# Patient Record
Sex: Female | Born: 1950 | Race: Black or African American | Hispanic: No | Marital: Single | Smoking: Never smoker
Health system: Southern US, Community
[De-identification: ages and names within clinical notes are randomized; demographics above are authoritative.]

## PROBLEM LIST (undated history)

## (undated) DIAGNOSIS — E785 Hyperlipidemia, unspecified: Secondary | ICD-10-CM

## (undated) DIAGNOSIS — I10 Essential (primary) hypertension: Secondary | ICD-10-CM

## (undated) HISTORY — DX: Hyperlipidemia, unspecified: E78.5

---

## 2002-05-03 ENCOUNTER — Ambulatory Visit (HOSPITAL_COMMUNITY): Admission: RE | Admit: 2002-05-03 | Discharge: 2002-05-03 | Payer: Self-pay | Admitting: Internal Medicine

## 2003-06-01 ENCOUNTER — Emergency Department (HOSPITAL_COMMUNITY): Admission: AD | Admit: 2003-06-01 | Discharge: 2003-06-01 | Payer: Self-pay | Admitting: Family Medicine

## 2003-07-03 ENCOUNTER — Encounter: Admission: RE | Admit: 2003-07-03 | Discharge: 2003-07-03 | Payer: Self-pay | Admitting: Family Medicine

## 2003-07-20 ENCOUNTER — Encounter: Admission: RE | Admit: 2003-07-20 | Discharge: 2003-07-20 | Payer: Self-pay | Admitting: Family Medicine

## 2003-10-27 ENCOUNTER — Encounter: Admission: RE | Admit: 2003-10-27 | Discharge: 2003-10-27 | Payer: Self-pay | Admitting: Family Medicine

## 2003-11-15 ENCOUNTER — Encounter (INDEPENDENT_AMBULATORY_CARE_PROVIDER_SITE_OTHER): Payer: Self-pay | Admitting: *Deleted

## 2003-11-15 ENCOUNTER — Encounter: Admission: RE | Admit: 2003-11-15 | Discharge: 2003-11-15 | Payer: Self-pay | Admitting: Sports Medicine

## 2003-11-15 LAB — CONVERTED CEMR LAB

## 2003-11-23 ENCOUNTER — Encounter: Admission: RE | Admit: 2003-11-23 | Discharge: 2003-11-23 | Payer: Self-pay | Admitting: Family Medicine

## 2003-12-08 ENCOUNTER — Encounter: Admission: RE | Admit: 2003-12-08 | Discharge: 2003-12-08 | Payer: Self-pay | Admitting: Sports Medicine

## 2004-03-08 ENCOUNTER — Ambulatory Visit: Payer: Self-pay | Admitting: Family Medicine

## 2004-03-08 ENCOUNTER — Ambulatory Visit (HOSPITAL_COMMUNITY): Admission: RE | Admit: 2004-03-08 | Discharge: 2004-03-08 | Payer: Self-pay | Admitting: Family Medicine

## 2004-03-11 ENCOUNTER — Ambulatory Visit: Payer: Self-pay | Admitting: Family Medicine

## 2004-06-06 ENCOUNTER — Ambulatory Visit: Payer: Self-pay | Admitting: Sports Medicine

## 2004-10-18 ENCOUNTER — Ambulatory Visit: Payer: Self-pay | Admitting: Family Medicine

## 2005-02-14 ENCOUNTER — Ambulatory Visit: Payer: Self-pay | Admitting: Sports Medicine

## 2005-02-24 ENCOUNTER — Encounter: Admission: RE | Admit: 2005-02-24 | Discharge: 2005-02-24 | Payer: Self-pay | Admitting: Sports Medicine

## 2005-03-13 ENCOUNTER — Ambulatory Visit: Payer: Self-pay | Admitting: Family Medicine

## 2005-03-20 ENCOUNTER — Encounter: Admission: RE | Admit: 2005-03-20 | Discharge: 2005-03-20 | Payer: Self-pay | Admitting: Sports Medicine

## 2005-04-10 ENCOUNTER — Ambulatory Visit: Payer: Self-pay | Admitting: Family Medicine

## 2005-07-09 ENCOUNTER — Ambulatory Visit: Payer: Self-pay | Admitting: Family Medicine

## 2005-08-12 ENCOUNTER — Ambulatory Visit: Payer: Self-pay | Admitting: Sports Medicine

## 2005-12-19 ENCOUNTER — Ambulatory Visit: Payer: Self-pay | Admitting: Family Medicine

## 2006-02-27 ENCOUNTER — Ambulatory Visit: Payer: Self-pay | Admitting: Family Medicine

## 2006-03-23 ENCOUNTER — Encounter: Admission: RE | Admit: 2006-03-23 | Discharge: 2006-03-23 | Payer: Self-pay | Admitting: Family Medicine

## 2006-08-14 ENCOUNTER — Encounter (INDEPENDENT_AMBULATORY_CARE_PROVIDER_SITE_OTHER): Payer: Self-pay | Admitting: *Deleted

## 2006-08-21 ENCOUNTER — Telehealth: Payer: Self-pay | Admitting: Family Medicine

## 2006-09-01 ENCOUNTER — Ambulatory Visit: Payer: Self-pay | Admitting: Family Medicine

## 2006-09-01 DIAGNOSIS — I10 Essential (primary) hypertension: Secondary | ICD-10-CM

## 2006-10-01 ENCOUNTER — Telehealth: Payer: Self-pay | Admitting: *Deleted

## 2006-10-30 ENCOUNTER — Telehealth: Payer: Self-pay | Admitting: *Deleted

## 2007-01-07 ENCOUNTER — Ambulatory Visit: Payer: Self-pay | Admitting: Family Medicine

## 2007-01-07 DIAGNOSIS — E669 Obesity, unspecified: Secondary | ICD-10-CM

## 2007-03-12 ENCOUNTER — Encounter (INDEPENDENT_AMBULATORY_CARE_PROVIDER_SITE_OTHER): Payer: Self-pay | Admitting: Family Medicine

## 2007-03-12 ENCOUNTER — Ambulatory Visit: Payer: Self-pay | Admitting: Family Medicine

## 2007-03-12 LAB — CONVERTED CEMR LAB
CO2: 26 meq/L (ref 19–32)
Chloride: 104 meq/L (ref 96–112)
Cholesterol: 202 mg/dL — ABNORMAL HIGH (ref 0–200)
Potassium: 4.1 meq/L (ref 3.5–5.3)
Sodium: 142 meq/L (ref 135–145)
Total CHOL/HDL Ratio: 3.7
VLDL: 16 mg/dL (ref 0–40)

## 2007-03-16 ENCOUNTER — Encounter (INDEPENDENT_AMBULATORY_CARE_PROVIDER_SITE_OTHER): Payer: Self-pay | Admitting: Family Medicine

## 2007-04-01 ENCOUNTER — Encounter (INDEPENDENT_AMBULATORY_CARE_PROVIDER_SITE_OTHER): Payer: Self-pay | Admitting: Family Medicine

## 2007-04-16 ENCOUNTER — Encounter: Admission: RE | Admit: 2007-04-16 | Discharge: 2007-04-16 | Payer: Self-pay | Admitting: Sports Medicine

## 2007-07-13 ENCOUNTER — Telehealth: Payer: Self-pay | Admitting: Family Medicine

## 2007-08-17 ENCOUNTER — Ambulatory Visit: Payer: Self-pay | Admitting: Family Medicine

## 2007-08-17 DIAGNOSIS — M545 Low back pain: Secondary | ICD-10-CM

## 2007-08-31 ENCOUNTER — Ambulatory Visit: Payer: Self-pay | Admitting: *Deleted

## 2008-03-10 ENCOUNTER — Ambulatory Visit: Payer: Self-pay | Admitting: Family Medicine

## 2008-03-16 ENCOUNTER — Ambulatory Visit: Payer: Self-pay | Admitting: Gastroenterology

## 2008-03-16 ENCOUNTER — Telehealth: Payer: Self-pay | Admitting: Gastroenterology

## 2008-03-27 ENCOUNTER — Ambulatory Visit: Payer: Self-pay | Admitting: Gastroenterology

## 2008-03-27 ENCOUNTER — Encounter: Payer: Self-pay | Admitting: Gastroenterology

## 2008-03-27 HISTORY — PX: COLONOSCOPY: SHX174

## 2008-03-28 ENCOUNTER — Encounter: Payer: Self-pay | Admitting: Gastroenterology

## 2008-03-30 ENCOUNTER — Encounter: Payer: Self-pay | Admitting: Family Medicine

## 2008-04-17 ENCOUNTER — Encounter: Admission: RE | Admit: 2008-04-17 | Discharge: 2008-04-17 | Payer: Self-pay | Admitting: Family Medicine

## 2008-04-20 ENCOUNTER — Telehealth (INDEPENDENT_AMBULATORY_CARE_PROVIDER_SITE_OTHER): Payer: Self-pay | Admitting: *Deleted

## 2008-04-21 ENCOUNTER — Ambulatory Visit: Payer: Self-pay | Admitting: Family Medicine

## 2008-04-21 DIAGNOSIS — R05 Cough: Secondary | ICD-10-CM

## 2008-04-28 ENCOUNTER — Ambulatory Visit: Payer: Self-pay | Admitting: Family Medicine

## 2008-04-28 DIAGNOSIS — J309 Allergic rhinitis, unspecified: Secondary | ICD-10-CM | POA: Insufficient documentation

## 2008-05-23 ENCOUNTER — Telehealth: Payer: Self-pay | Admitting: *Deleted

## 2008-05-23 ENCOUNTER — Ambulatory Visit: Payer: Self-pay | Admitting: Family Medicine

## 2008-05-23 DIAGNOSIS — J029 Acute pharyngitis, unspecified: Secondary | ICD-10-CM

## 2008-08-08 ENCOUNTER — Encounter: Payer: Self-pay | Admitting: Family Medicine

## 2008-08-22 ENCOUNTER — Encounter: Payer: Self-pay | Admitting: Family Medicine

## 2009-02-28 ENCOUNTER — Telehealth: Payer: Self-pay | Admitting: Family Medicine

## 2009-03-07 ENCOUNTER — Encounter: Payer: Self-pay | Admitting: Family Medicine

## 2009-03-07 ENCOUNTER — Ambulatory Visit: Payer: Self-pay | Admitting: Family Medicine

## 2009-03-07 DIAGNOSIS — E785 Hyperlipidemia, unspecified: Secondary | ICD-10-CM

## 2009-03-07 LAB — CONVERTED CEMR LAB
Albumin: 4.6 g/dL (ref 3.5–5.2)
BUN: 10 mg/dL (ref 6–23)
Chloride: 103 meq/L (ref 96–112)
Creatinine, Ser: 0.9 mg/dL (ref 0.40–1.20)
Glucose, Bld: 136 mg/dL — ABNORMAL HIGH (ref 70–99)
HDL: 56 mg/dL (ref >39)
LDL Cholesterol: 130 mg/dL — ABNORMAL HIGH (ref 0–99)
Potassium: 4.1 meq/L (ref 3.5–5.3)
Total Protein: 7.4 g/dL (ref 6.0–8.3)

## 2009-08-13 ENCOUNTER — Encounter: Payer: Self-pay | Admitting: Family Medicine

## 2009-08-13 ENCOUNTER — Ambulatory Visit: Payer: Self-pay | Admitting: Family Medicine

## 2009-08-14 ENCOUNTER — Ambulatory Visit (HOSPITAL_COMMUNITY): Admission: RE | Admit: 2009-08-14 | Discharge: 2009-08-14 | Payer: Self-pay | Admitting: Family Medicine

## 2009-08-15 ENCOUNTER — Telehealth (INDEPENDENT_AMBULATORY_CARE_PROVIDER_SITE_OTHER): Payer: Self-pay | Admitting: *Deleted

## 2009-08-15 ENCOUNTER — Telehealth: Payer: Self-pay | Admitting: *Deleted

## 2009-08-17 LAB — CONVERTED CEMR LAB
ALT: 20 units/L (ref 0–35)
CO2: 27 meq/L (ref 19–32)
Calcium: 9.6 mg/dL (ref 8.4–10.5)
Creatinine, Ser: 0.82 mg/dL (ref 0.40–1.20)
Direct LDL: 172 mg/dL — ABNORMAL HIGH
Glucose, Bld: 122 mg/dL — ABNORMAL HIGH (ref 70–99)
Potassium: 4.1 meq/L (ref 3.5–5.3)
Sodium: 141 meq/L (ref 135–145)

## 2009-08-23 ENCOUNTER — Telehealth: Payer: Self-pay | Admitting: *Deleted

## 2009-10-19 ENCOUNTER — Telehealth: Payer: Self-pay | Admitting: Family Medicine

## 2010-02-26 ENCOUNTER — Ambulatory Visit: Payer: Self-pay | Admitting: Family Medicine

## 2010-02-26 DIAGNOSIS — E119 Type 2 diabetes mellitus without complications: Secondary | ICD-10-CM | POA: Insufficient documentation

## 2010-02-27 ENCOUNTER — Encounter: Payer: Self-pay | Admitting: Family Medicine

## 2010-02-27 ENCOUNTER — Ambulatory Visit: Payer: Self-pay | Admitting: Family Medicine

## 2010-02-27 LAB — CONVERTED CEMR LAB: Hgb A1c MFr Bld: 6.8 %

## 2010-02-28 LAB — CONVERTED CEMR LAB
ALT: 17 units/L (ref 0–35)
AST: 18 units/L (ref 0–37)
Albumin: 4.6 g/dL (ref 3.5–5.2)
Alkaline Phosphatase: 84 units/L (ref 39–117)
CO2: 28 meq/L (ref 19–32)
Chloride: 103 meq/L (ref 96–112)
Glucose, Bld: 117 mg/dL — ABNORMAL HIGH (ref 70–99)
Potassium: 4.1 meq/L (ref 3.5–5.3)
Total Protein: 7.4 g/dL (ref 6.0–8.3)

## 2010-03-06 ENCOUNTER — Telehealth: Payer: Self-pay | Admitting: Family Medicine

## 2010-06-11 ENCOUNTER — Ambulatory Visit: Payer: Self-pay | Admitting: Family Medicine

## 2010-06-11 LAB — CONVERTED CEMR LAB: Hgb A1c MFr Bld: 6.4 %

## 2010-07-16 NOTE — Assessment & Plan Note (Signed)
Summary: cpe,tcb   Vital Signs:  Patient profile:   60 year old female Height:      62 inches Weight:      174.4 pounds BMI:     32.01 Temp:     98.1 degrees F oral Pulse rate:   76 / minute BP sitting:   130 / 78  (right arm) Cuff size:   regular  Vitals Entered By: Garen Grams LPN (August 13, 2009 10:20 AM) CC: CPE Is Patient Diabetic? No Pain Assessment Patient in pain? no        Primary Care Provider:  Bobby Rumpf  MD  CC:  CPE.  History of Present Illness: 1) HTN: On Toprol XL 25 mg, Avalide 300/125 mg. BP today 130/78. Denies chest pain, dyspnea, LE edema. Checks BPs at home - highest systolic is 150's - depends on foods she eats. Has been monitoring sodium intake. Has started to increase fruit and vegetable intake. No exercise currently but has treadmill at home. [facility] no [facility] able to provide Avalide - she does not know for sure which medications they have but they sent her a notification regarding this.   2) Obesity: 174 lbs today (states that weight was 169 at homne this AM). 183 lbs in December 2009. Still not currently exercising - wants to join group of friends to start walking or buy a new treadmill. Has been increasing intake of fruits and vegetables, whole grains; has cut out sodas and fried foods.. Wants to lose weight but not always motivated to exercise.   3) Hyperlipidemia: Lipid panel last checked in September 2010. Total cholesterol 206, trig 102, HDL 56, LDL 130. Exercise history and diet history as above. Denies chest pain, claudication, stroke hx.   Habits & Providers  Alcohol-Tobacco-Diet     Tobacco Status: never  Current Medications (verified): 1)  Metoprolol Succinate 25 Mg Tb24 (Metoprolol Succinate) .... Take One By Mouth Once Daily 2)  Avalide 300-12.5 Mg Tabs (Irbesartan-Hydrochlorothiazide) .Marland Kitchen.. 1 Tab By Mouth Daily. 3)  Flonase 50 Mcg/act Susp (Fluticasone Propionate) .Marland Kitchen.. 1 Inh Per Nostril Daily.  Allergies (verified): 1)   Penicillin G Potassium (Penicillin G Potassium)  Past History:  Past Medical History: Last updated: 01/07/2007 HTN Hypercholesterolemia Obesity  Past Surgical History: Last updated: 08/13/2006 Tubal ligation - 07/06/2003  Social History: Last updated: 08/13/2009 Separated. Has 3 adult sons in [place]. Works at [employer]  No tobacco, EtOH, or drugs.  48 year old grand daughter lives in [place], [place] and stays with pt frequently.  Family History: Reviewed history from 08/13/2006 and no changes required. HTN  Social History: Separated. Has 3 adult sons in [place]. Works at [employer]  No tobacco, EtOH, or drugs.  62 year old grand daughter lives in [place], [place] and stays with pt frequently.  Physical Exam  General:  obese pleasant female, NAD  vitals reviewed  Eyes:  No corneal or conjunctival inflammation noted. EOMI. Perrla. Funduscopic exam benign, without hemorrhages, exudates or papilledema. Vision grossly normal. Nose:  External nasal examination shows no deformity or inflammation. Nasal mucosa are pink and moist without lesions or exudates. Mouth:  Oral mucosa and oropharynx without lesions or exudates.  Teeth in good repair. Neck:  no JVD or carotid bruit or masses  Lungs:  CTAB w/o wheeze or crackles , normal WOB   Heart:  RRR< no murmurs, normal PMI  Abdomen:  obese , soft, non tender, +BS  Msk:  5/5 strength all extremities, no joint pain or swelling  Pulses:  2+ radials and pedal  Extremities:  no edema  Neurologic:  alert & oriented X3, cranial nerves II-XII intact, strength normal in all extremities, sensation intact to light touch, gait normal, and DTRs symmetrical and normal.     Impression & Recommendations:  Problem # 1:  HYPERLIPIDEMIA (ICD-272.4) Assessment Unchanged Recheck  FASTING LDL, CMET. Advised to increase activity, continue dietary modifications. Follow up 6 months  Orders: Direct LDL-[facility] [phone]) Comp Met-[facility] ([id number])  Problem #  2:  HYPERTENSION (ICD-401.9) Assessment: Unchanged  Will check FASTING CMET,LDL. Patient at goal of less than 140/90. Will have patient bring paperwork from [facility] this afternoon to see what changes need to be made to medications since they no longer carry Avalide. Reviewed DASH diet, exercise for 20 minutes with this patient. Sodium restriction < 2000 mg. Follow up 6 months.   Her updated medication list for this problem includes:    Metoprolol Succinate 25 Mg Tb24 (Metoprolol succinate) .Marland Kitchen... Take one by mouth once daily    Avalide 300-12.5 Mg Tabs (Irbesartan-hydrochlorothiazide) .Marland Kitchen... 1 tab by mouth daily.  Orders: Comp Met-[facility] [id number]) Lipid-[facility] ([id number])  Problem # 3:  PHYSICAL EXAMINATION (ICD-V70.0) Assessment: Comment Only Mammogram referral given. Flu vaccine given. UTD on colonoscopy Pap in 6 months (now at q 3 years)  Problem # 4:  OBESITY (ICD-278.00)  Improving. Discussed DASH diet, exercise for 20 minutes with this patient. Follow in 6 months.   Ht: 62 (08/13/2009)   Wt: 174.4 (08/13/2009)   BMI: 32.01 (08/13/2009)  Complete Medication List: 1)  Metoprolol Succinate 25 Mg Tb24 (Metoprolol succinate) .... Take one by mouth once daily 2)  Avalide 300-12.5 Mg Tabs (Irbesartan-hydrochlorothiazide) .Marland Kitchen.. 1 tab by mouth daily. 3)  Flonase 50 Mcg/act Susp (Fluticasone propionate) .Marland Kitchen.. 1 inh per nostril daily.  Patient Instructions: 1)  It was great to see you today!  2)  Bring the paper with the available medications for your Avalide by today so I can refill it for you.  3)  Great job on losing weight; keep working toward your weight loss goal! 4)  Start walking with firends, at home on the treadmill or at the gym 45 minutes a day 5-7 times per week (start slow at first then work up to this) 5)  Get your mammogram scheduled. 6)  Follow up in 6 months. We will talk about your weight, blood pressure and will do your Pap smear then.    Prevention & Chronic  Care Immunizations   Influenza vaccine: Fluvax 3+  (03/10/2008)   Influenza vaccine due: 03/10/2009    Tetanus booster: 11/15/2003: Done.   Tetanus booster due: 11/14/2013    Pneumococcal vaccine: Not documented  Colorectal Screening   Hemoccult: Done.  (11/15/2003)   Hemoccult due: Not Indicated    Colonoscopy: Location:  [facility].    (03/27/2008)   Colonoscopy due: 03/27/2018  Other Screening   Pap smear: Done.  (11/15/2003)   Pap smear due: 03/07/2010    Mammogram: normal  (04/17/2008)   Mammogram action/deferral: Ordered  (08/13/2009)   Mammogram due: 04/17/2009   Smoking status: never  (08/13/2009)  Lipids   Total Cholesterol: 206  (03/07/2009)   Lipid panel action/deferral: LDL Direct ordered   LDL: 884  (03/07/2009)   LDL Direct: Not documented   HDL: 56  (03/07/2009)   Triglycerides: 102  (03/07/2009)   Lipid panel due: 02/10/2010    SGOT (AST): 20  (03/07/2009)   BMP action: Ordered   SGPT (ALT): 23  (  03/07/2009) CMP ordered    Alkaline phosphatase: 96  (03/07/2009)   Total bilirubin: 0.3  (03/07/2009)   Liver panel due: 02/10/2010    Lipid flowsheet reviewed?: Yes   Progress toward LDL goal: At goal  Hypertension   Last Blood Pressure: 130 / 78  (08/13/2009)   Serum creatinine: 0.90  (03/07/2009)   BMP action: Ordered   Serum potassium 4.1  (03/07/2009) CMP ordered    Basic metabolic panel due: 03/07/2010    Hypertension flowsheet reviewed?: Yes   Progress toward BP goal: At goal  Self-Management Support :   Personal Goals (by the next clinic visit) :      Personal blood pressure goal: 140/90  (08/13/2009)     Personal LDL goal: 130  (08/13/2009)    Patient will work on the following items until the next clinic visit to reach self-care goals:     Medications and monitoring: take my medicines every day, check my blood pressure, bring all of my medications to every visit, weigh myself weekly  (08/13/2009)     Eating: drink  diet soda or water instead of juice or soda, eat more vegetables, use fresh or frozen vegetables, eat foods that are low in salt, eat baked foods instead of fried foods, eat fruit for snacks and desserts, limit or avoid alcohol  (08/13/2009)     Activity: take a 30 minute walk every day, join a walking program  (03/07/2009)    Hypertension self-management support: BP self-monitoring log, Written self-care plan  (03/07/2009)    Hypertension self-management support not done because: Good outcomes  (08/13/2009)    Lipid self-management support: Written self-care plan  (03/07/2009)     Lipid self-management support not done because: Good outcomes  (08/13/2009)  Appended Document: Orders Update    Clinical Lists Changes  Orders: Added new Test order of [facility] - Est  40-64 yrs ([address]11) - Signed      Appended Document: Avalide to Avapro + HCTZ  Change from Avalide to Avapro and HCTZ as below. Bobby Rumpf  MD  August 15, 2009 12:53 PM    Clinical Lists Changes  Medications: Added new medication of AVAPRO 300 MG TABS (IRBESARTAN) one tab by mouth qday - Signed Added new medication of HYDROCHLOROTHIAZIDE 12.5 MG CAPS (HYDROCHLOROTHIAZIDE) one tab by mouth qday - Signed Removed medication of AVALIDE 300-12.5 MG TABS (IRBESARTAN-HYDROCHLOROTHIAZIDE) 1 tab by mouth daily. Rx of AVAPRO 300 MG TABS (IRBESARTAN) one tab by mouth qday;  #30 x 3;  Signed;  Entered by: Bobby Rumpf  MD;  Authorized by: Bobby Rumpf  MD;  Method used: Faxed to [facility], [address] [place], [place], [place]  [address], Ph: [phone], Fax: [phone] Rx of HYDROCHLOROTHIAZIDE 12.5 MG CAPS (HYDROCHLOROTHIAZIDE) one tab by mouth qday;  #30 x 3;  Signed;  Entered by: Bobby Rumpf  MD;  Authorized by: Bobby Rumpf  MD;  Method used: Faxed to [facility], [address] [place], [place], [place]  [address], Ph: [phone], Fax: [phone]    Prescriptions: HYDROCHLOROTHIAZIDE 12.5 MG CAPS  (HYDROCHLOROTHIAZIDE) one tab by mouth qday  #30 x 3   Entered and Authorized by:   Bobby Rumpf  MD   Signed by:   Bobby Rumpf  MD on 08/15/2009   Method used:   Faxed to ...       [facility] Department (retail)       [address] [place], [place]  [address]       Ph: [phone]       Fax: [phone]   RxID:   [id number] AVAPRO 300 MG TABS (IRBESARTAN) one tab by mouth qday  #30 x 3   Entered and Authorized by:   Bobby Rumpf  MD   Signed by:   Bobby Rumpf  MD on 08/15/2009   Method used:   Faxed to ...       [facility] Department (retail)       [address] [place], [place]  [address]       Ph: [phone]       Fax: [phone]   RxID:   [id number]

## 2010-07-16 NOTE — Progress Notes (Signed)
Summary: meds prob  Phone Note Call from Patient   Caller: Patient Summary of Call: pharm states that meds are not there - [facility]d Initial call taken by: De Nurse,  August 23, 2009 2:29 PM  Follow-up for Phone Call        called [facility] to make sure they rec'd the fax.  had to leave a message Follow-up by: [name] RN,  August 23, 2009 2:30 PM    Prescriptions: AVAPRO 300 MG TABS (IRBESARTAN) one tab by mouth qday  #30 x 3   Entered by:   [name] RN   Authorized by:   Bobby Rumpf  MD   Signed by:   [name] RN on 08/23/2009   Method used:   Printed then faxed to ...       [facility] Department (retail)       [address] [place], [place]  [address]       Ph: [phone]       Fax: [phone]   RxID:   [id number] HYDROCHLOROTHIAZIDE 12.5 MG CAPS (HYDROCHLOROTHIAZIDE) one tab by mouth qday  #30 x 3   Entered by:   [name] RN   Authorized by:   Bobby Rumpf  MD   Signed by:   [name] RN on 08/23/2009   Method used:   Printed then faxed to ...       [facility] Department (retail)       [address] [place], [place]  [address]       Ph: [phone]       Fax: [phone]   RxID:   [id number]  They have not called back. I printed the rx & faxed it to them.[name] RN  August 23, 2009 4:08 PM

## 2010-07-16 NOTE — Progress Notes (Signed)
Summary: phn msg  Phone Note Call from Patient Call back at [facility] Phone [phone]   Caller: Patient Summary of Call: Pt reading a book about losing weight and to follow the guidelines she would need her blood type.  Wondering if Dr. Wallene Huh would mind order the lab work so she can get this. Initial call taken by: Clydell Hakim,  Oct 19, 2009 2:07 PM  Follow-up for Phone Call        Pt calling back about if we can get her blood type. Follow-up by: Clydell Hakim,  Oct 24, 2009 4:57 PM  Additional Follow-up for Phone Call Additional follow up Details #1::        Advised [name] that I am happy that she is exercising and eating healthier foods; advised that there is little evidence to support "dieting by blood type"; advised that if she were to donate blood that they would tell her her blood type there, or we would check it for her at [facility]. She will decide on what she wants to do, but will continue to exercise.  Additional Follow-up by: Bobby Rumpf  MD,  Nov 03, 2009 12:05 PM

## 2010-07-16 NOTE — Progress Notes (Signed)
Summary: triage  Phone Note Call from Patient Call back at Home Phone [phone]   Caller: Patient Summary of Call: Pt returning Sally's call. Initial call taken by: Clydell Hakim,  August 15, 2009 4:41 PM  Follow-up for Phone Call        Pt notified of Rx change and that Rx at pharm, see last phone note Follow-up by: Gladstone Pih,  August 15, 2009 4:43 PM

## 2010-07-16 NOTE — Progress Notes (Signed)
Summary: lm for pt to call  ---- Converted from flag ---- ---- 08/15/2009 2:00 PM, Clydell Hakim wrote:   ---- 08/15/2009 12:55 PM, Bobby Rumpf  MD wrote: Please call patient to let her know that her prescription for her blood pressure medication has been faxed to the pharmacy. She will now be taking two pills instead of one but it is exactly the same medication as her old combo pill.  Thanks! Khary ------------------------------  I had to leave a message for her to call back. will tell her about meds then

## 2010-07-16 NOTE — Assessment & Plan Note (Signed)
Summary: follow up BP, HLD, Pap/bmc   Vital Signs:  Patient profile:   60 year old female Height:      62 inches Weight:      161.8 pounds BMI:     29.70 Temp:     98.1 degrees F oral Pulse rate:   65 / minute BP sitting:   140 / 77  (right arm) Cuff size:   regular  Vitals Entered By: Garen Grams LPN (February 26, 2010 2:55 PM) CC: pap and f/u bp Is Patient Diabetic? No Pain Assessment Patient in pain? yes     Location: left arm   Primary Care Provider:  Bobby Rumpf  MD  CC:  pap and f/u bp.  History of Present Illness: 1) Screening: Due for Pap. Q3 years.  2) HTN: On Toprol XL 25 mg, Avalide 300/125 mg. BP today 140/77. Denies chest pain, dyspnea, LE edema. Checks BPs at home - highest systolic is 130s - depends on foods she eats. Has been monitoring sodium intake. Has increased fruit and vegetable intake. Exercise on treadmill 5 days a week. Weight down 14 lbs since last vist. BMI from 32 to 29.   3) Obesity: 161lbs today. 175 lbs in Feb 2011. 183 lbs in December 2009. Exercise as above. Has been increasing intake of fruits and vegetables, whole grains; has cut out sodas and fried foods.  4) Hyperlipidemia: Full ipid panel last checked in September 2010. Total cholesterol 206, trig 102, HDL 56, LDL 130. LDL direct in Feb 2011 = 172. Dietary and execrise changes as above.  Denies chest pain, claudication, stroke hx.   5) Glucose intolerance: blood glucose 122 on fasting CMET. Has implemented changes as above. Denies polyuria, polydipsia, vision change   Habits & Providers  Alcohol-Tobacco-Diet     Tobacco Status: never  Current Medications (verified): 1)  Metoprolol Succinate 25 Mg Tb24 (Metoprolol Succinate) .... Take One By Mouth Once Daily 2)  Flonase 50 Mcg/act Susp (Fluticasone Propionate) .Marland Kitchen.. 1 Inh Per Nostril Daily. 3)  Avapro 300 Mg Tabs (Irbesartan) .... One Tab By Mouth Qday 4)  Hydrochlorothiazide 12.5 Mg Caps (Hydrochlorothiazide) .... One Tab By Mouth  Qday  Allergies (verified): 1)  Penicillin G Potassium (Penicillin G Potassium)  Past History:  Past Medical History: Last updated: 01/07/2007 HTN Hypercholesterolemia Obesity  Past Surgical History: Last updated: 08/13/2006 Tubal ligation - 07/06/2003  Family History: Last updated: 08/13/2006 HTN  Physical Exam  General:  overweight  pleasant female, NAD  vitals reviewed - intentional weight loss noted! no longer obese! Eyes:  fundi normal, pupils equal, round and reactive to light, extraoccular movements intact   Nose:  External nasal examination shows no deformity or inflammation. Nasal mucosa are pink and moist without lesions or exudates. Mouth:  Oral mucosa and oropharynx without lesions or exudates.  Teeth in good repair. Neck:  no JVD or carotid bruit or masses  Lungs:  CTAB w/o wheeze or crackles , normal WOB   Heart:  RRR, no murmurs, normal PMI  Genitalia:  Pelvic Exam:        External: normal female genitalia without lesions or masses        Vagina: normal without lesions or masses        Cervix: normal without lesions or masses        Adnexa: normal bimanual exam without masses or fullness        Uterus: normal by palpation        Pap smear: performed Pulses:  2+ radials and pedal  Extremities:  no edema  Neurologic:  alert & oriented X3 and cranial nerves II-XII intact.     Impression & Recommendations:  Problem # 1:  IMPAIRED FASTING GLUCOSE (ICD-790.21) Assessment Unchanged  Check A1C. Continue dietary and exercise modifications. Will follow in 3 months.  Orders: [facility]- Est  Level 4 (99214)Future Orders: A1C-[facility] ([id number]) ... 02/27/2011  Labs Reviewed: Creat: 0.82 (08/13/2009)     Problem # 2:  HYPERLIPIDEMIA (ICD-272.4) Assessment: Deteriorated  Recheck LDL direct. Continue dietary and exerciser modifications  Orders: [facility]- Est  Level 4 (99214)Future Orders: Direct LDL-[facility] ([id number]) ... 02/27/2011  Labs Reviewed: SGOT: 20 (08/13/2009)    SGPT: 20 (08/13/2009)  Prior 10 Yr Risk Heart Disease: 11 % (03/10/2008)   HDL:56 (03/07/2009), 54 (03/12/2007)  LDL:130 (03/07/2009), 132 (03/12/2007)  Chol:206 (03/07/2009), 202 (03/12/2007)  Trig:102 (03/07/2009), 80 (03/12/2007)  Problem # 3:  HYPERTENSION (ICD-401.9)  At goal. Continue medications as below. Reviewed DASH diet. Follow up three months. CMET today.  Her updated medication list for this problem includes:    Metoprolol Succinate 25 Mg Tb24 (Metoprolol succinate) .Marland Kitchen... Take one by mouth once daily    Avapro 300 Mg Tabs (Irbesartan) ..... One tab by mouth qday    Hydrochlorothiazide 12.5 Mg Caps (Hydrochlorothiazide) ..... One tab by mouth qday  Orders: [facility]- Est  Level 4 (99214)Future Orders: Comp Met-[facility] ([id number]) ... 02/27/2011  BP today: 140/77 Prior BP: 130/78 (08/13/2009)  Prior 10 Yr Risk Heart Disease: 11 % (03/10/2008)  Labs Reviewed: K+: 4.1 (08/13/2009) Creat: : 0.82 (08/13/2009)   Chol: 206 (03/07/2009)   HDL: 56 (03/07/2009)   LDL: 130 (03/07/2009)   TG: 102 (03/07/2009)  Problem # 4:  OBESITY (ICD-278.00) Assessment: Improved  No longer obese by BMI - now overweight. Congratulated on weight loss and encouraged continued efforts.   Orders: [facility]- Est  Level 4 ([address]214)  Problem # 5:  SCREENING FOR MALIGNANT NEOPLASM, CERVIX (ICD-V76.2) Pap today.  Orders: Pap Smear-[facility] ([id number])  Complete Medication List: 1)  Metoprolol Succinate 25 Mg Tb24 (Metoprolol succinate) .... Take one by mouth once daily 2)  Flonase 50 Mcg/act Susp (Fluticasone propionate) .Marland Kitchen.. 1 inh per nostril daily. 3)  Avapro 300 Mg Tabs (Irbesartan) .... One tab by mouth qday 4)  Hydrochlorothiazide 12.5 Mg Caps (Hydrochlorothiazide) .... One tab by mouth qday  Patient Instructions: 1)  It was great to see you today!  2)  Great job on losing weight; keep working toward your weight loss goal! 3)  Follow up in 3 months regarding your blood pressure, cholesterol and blood  sugars

## 2010-07-16 NOTE — Progress Notes (Signed)
Summary: Discuss lab results (new DM2 diagnosis)   Phone Note Outgoing Call   Summary of Call: Called to discuss results of labs. Left message advising patient to call back to discuss results.  Initial call taken by: Bobby Rumpf  MD,  March 06, 2010 2:29 PM  Follow-up for Phone Call        Patient called back. Advised that she meets diagnosis criteria for DM2. Will proceed with diet and exercise modification for now, follow up at next appointment. Patient highly motivated. Would start statin at next visit.  Follow-up by: Bobby Rumpf  MD,  March 06, 2010 4:33 PM

## 2010-07-18 NOTE — Assessment & Plan Note (Signed)
Summary: referral for eye doc,df   Vital Signs:  Patient profile:   60 year old female Height:      62 inches Weight:      168.7 pounds BMI:     30.97 Temp:     98.1 degrees F oral Pulse rate:   80 / minute BP sitting:   146 / 82  (right arm) Cuff size:   regular  Vitals Entered By: Jimmy Footman, CMA (June 11, 2010 3:55 PM) CC: referral to eye doctor Is Patient Diabetic? No Pain Assessment Patient in pain? no        Primary Care Provider:  Bobby Rumpf  MD  CC:  referral to eye doctor.  History of Present Illness: 1) HTN: On Toprol XL 25 mg, Avalide 300/125 mg. BP today 146/82 today (goal 130/80 w/ DM2 new diagnosis). Denies chest pain, dyspnea, LE edema. Checks BPs at home - highest systolic is 130s - depends on foods she eats. Has been monitoring sodium intake. Had ncreased fruit and vegetable intake. Exercise on treadmill 2 days a week - had been exercising 5 days a week.   2) Obesity: 161lbs three months ago, 168 today. 175 lbs in Feb 2011. 183 lbs in December 2009. Exercise as above. Had been increasing intake of fruits and vegetables, whole grains; had cut out sodas and fried foods, but over Thanksgiving and Christmas holidays has been over-indulging.   3) Hyperlipidemia: LDL = 136 three months ago. New diagnosis DM2 as below three months ago. At that time patient did not wish to start on statin, reported that she would use diet and exercise to control even after discussion of CAD, stroke etc. risk.  . Denies chest pain, claudication, stroke hx.   4) DM2: New diagnosis with A1C 6.8 three months ago. Did not want to start medication, reported that she would use diet and exercise to control. . Denies polyuria, polydipsia, vision change   Current Medications (verified): 1)  Metoprolol Succinate 25 Mg Tb24 (Metoprolol Succinate) .... Take One By Mouth Once Daily 2)  Flonase 50 Mcg/act Susp (Fluticasone Propionate) .Marland Kitchen.. 1 Inh Per Nostril Daily. 3)  Avapro 300 Mg Tabs  (Irbesartan) .... One Tab By Mouth Qday 4)  Hydrochlorothiazide 12.5 Mg Caps (Hydrochlorothiazide) .... One Tab By Mouth Qday  Allergies (verified): 1)  Penicillin G Potassium (Penicillin G Potassium)   Impression & Recommendations:  Problem # 1:  DIABETES MELLITUS, TYPE II (ICD-250.00)  New diagnosis. A1C today. On ARB. Will refer for diabetic eye exam (and for glasses). Does not want to start on medication - states that she will continue with dietary a=d lifestyle modification. Follow i  three months. Discussed diet and exercise for 25 minutes.  Orders: [facility]- Est  Level 4 (16109) Ophthalmology Referral (Ophthalmology)  Her updated medication list for this problem includes:    Avapro 300 Mg Tabs (Irbesartan) ..... One tab by mouth qday  Labs Reviewed: Creat: 0.81 (02/27/2010)    Reviewed HgBA1c results: 6.4 (06/11/2010)  6.8 (02/27/2010)  Problem # 2:  HYPERTENSION (ICD-401.9) Assessment: Unchanged  Not at goal (130/80). Does not want to increase doses of any of her medicines. Wants to try diet and exercise. Discussed risks and benefits of not starting medication at this time and discussed fact that her new BP goal is lower than before. Patient adamant that she does not want to start new medication or increase dose. Will follow in three months.   Her updated medication list for this problem includes:  Metoprolol Succinate 25 Mg Tb24 (Metoprolol succinate) .Marland Kitchen... Take one by mouth once daily    Avapro 300 Mg Tabs (Irbesartan) ..... One tab by mouth qday    Hydrochlorothiazide 12.5 Mg Caps (Hydrochlorothiazide) ..... One tab by mouth qday  BP today: 146/82 Prior BP: 140/77 (02/26/2010)  Prior 10 Yr Risk Heart Disease: 11 % (03/10/2008)  Labs Reviewed: K+: 4.1 (02/27/2010) Creat: : 0.81 (02/27/2010)   Chol: 206 (03/07/2009)   HDL: 56 (03/07/2009)   LDL: 130 (03/07/2009)   TG: 102 (03/07/2009)  Orders: [facility]- Est  Level 4 (99214)  Problem # 3:  HYPERLIPIDEMIA  (ICD-272.4) Assessment: Unchanged  Not at goal. Does not want to start statin. Follow up in three months. Check direct LDL then.   Orders: [facility]- Est  Level 4 (59563)  Problem # 4:  OBESITY (ICD-278.00) Assessment: Deteriorated  Weight back up. Discussed diet and exercise. Follow in three months.   Orders: [facility]- Est  Level 4 (87564)  Complete Medication List: 1)  Metoprolol Succinate 25 Mg Tb24 (Metoprolol succinate) .... Take one by mouth once daily 2)  Flonase 50 Mcg/act Susp (Fluticasone propionate) .Marland Kitchen.. 1 inh per nostril daily. 3)  Avapro 300 Mg Tabs (Irbesartan) .... One tab by mouth qday 4)  Hydrochlorothiazide 12.5 Mg Caps (Hydrochlorothiazide) .... One tab by mouth qday  Other Orders: A1C-[facility] (33295)  Patient Instructions: 1)  Follow up with me in three months. 2)  Exercise as we discussed.  3)  Avoid salty foods, fried foods and fast food 4)  Eat more fruits and vegetables (5-6 servings per day)  5)  We will refer you to get your eyes checked for your glasses   Orders Added: 1)  A1C-[facility] [83036] 2)  [facility]- Est  Level 4 [18841] 3)  Ophthalmology Referral [Ophthalmology]    Laboratory Results   Blood Tests   Date/Time Received: June 11, 2010 4:22 PM  Date/Time Reported: June 11, 2010 4:36 PM   HGBA1C: 6.4%   (Normal Range: Non-Diabetic - 3-6%   Control Diabetic - 6-8%)  Comments: ...............test performed by......Marland KitchenBonnie A. [place], MLS (ASCP)cm

## 2010-08-09 ENCOUNTER — Telehealth: Payer: Self-pay | Admitting: Family Medicine

## 2010-08-09 NOTE — Telephone Encounter (Signed)
Patient was prescribed Avalide 300/12.5 mg in past but this med was unavailable from the Medication Assistance program so she was given Avapro 300 and HCTZ 12.5.  They now have the Avalide so wanting to know if they could switch her to that.  Dr. Wallene Huh approved.  Called Rosey Bath back with the okay to switch to Avalide.

## 2010-08-09 NOTE — Telephone Encounter (Signed)
Called this number back and reached a conference room.  The person answering said that Deborah Simon was not there and did not know a number I could reach her at.  If she calls back please get a valid number.

## 2010-08-09 NOTE — Telephone Encounter (Signed)
Needs to speak with RN re: meds requested

## 2010-11-15 ENCOUNTER — Telehealth: Payer: Self-pay | Admitting: *Deleted

## 2010-11-15 NOTE — Telephone Encounter (Signed)
Patient comes to office  stating she picked up toprol XL and was told that she would need to get fluid med from [place].   She tried to get Avalide refilled and the [facility] pharmacy does not have this med . Spoke withTeresa  at the [facility]  and they can give her Avapro 300 mg to take one daily  and HCTZ 25 mg 1/2 tab daily to take instead of Avalide.  # 30 tabs and no refill .   Patient will go back to pick up now. Advised her she needs to schedule appointment .

## 2010-12-27 ENCOUNTER — Encounter: Payer: Self-pay | Admitting: Family Medicine

## 2010-12-27 ENCOUNTER — Ambulatory Visit (INDEPENDENT_AMBULATORY_CARE_PROVIDER_SITE_OTHER): Payer: Self-pay | Admitting: Family Medicine

## 2010-12-27 VITALS — BP 142/84 | HR 77 | Temp 97.5°F | Ht 62.0 in | Wt 174.8 lb

## 2010-12-27 DIAGNOSIS — M545 Low back pain, unspecified: Secondary | ICD-10-CM

## 2010-12-27 DIAGNOSIS — E785 Hyperlipidemia, unspecified: Secondary | ICD-10-CM

## 2010-12-27 DIAGNOSIS — I10 Essential (primary) hypertension: Secondary | ICD-10-CM

## 2010-12-27 DIAGNOSIS — E119 Type 2 diabetes mellitus without complications: Secondary | ICD-10-CM

## 2010-12-27 MED ORDER — TRAMADOL HCL 50 MG PO TABS: 50.0000 mg | ORAL_TABLET | Freq: Four times a day (QID) | ORAL | Status: DC | PRN

## 2010-12-27 MED ORDER — METFORMIN HCL 1000 MG PO TABS: 1000.0000 mg | ORAL_TABLET | Freq: Two times a day (BID) | ORAL | Status: DC

## 2010-12-27 MED ORDER — METFORMIN HCL 500 MG PO TABS: ORAL_TABLET | ORAL | Status: DC

## 2010-12-27 NOTE — Patient Instructions (Signed)
For the diabetes:  Start the metformin Call if you have problems  For the cholesterol: Please make an appt for lab work in the AM Come without eating or drinking except for black coffee or water   For the high blood pressure: Continue working on weight and diet Increase fruits and veggies, but mostly veggies to limit sugar  For your back: Take tylenol 500mg  three times a day Take your tramadol 1-2 times a day with the tylenol If you get a cold or start taking other medications over the counter, talk to the pharmacist to make sure they are ok with the tylenol you are taking

## 2010-12-30 ENCOUNTER — Other Ambulatory Visit: Payer: Self-pay

## 2010-12-30 DIAGNOSIS — E785 Hyperlipidemia, unspecified: Secondary | ICD-10-CM

## 2010-12-30 LAB — COMPREHENSIVE METABOLIC PANEL
ALT: 21 U/L (ref 0–35)
AST: 23 U/L (ref 0–37)
Alkaline Phosphatase: 98 U/L (ref 39–117)
BUN: 15 mg/dL (ref 6–23)
CO2: 26 mEq/L (ref 19–32)
Calcium: 9.8 mg/dL (ref 8.4–10.5)
Chloride: 105 mEq/L (ref 96–112)
Potassium: 4.2 mEq/L (ref 3.5–5.3)
Sodium: 141 mEq/L (ref 135–145)
Total Bilirubin: 0.4 mg/dL (ref 0.3–1.2)

## 2010-12-30 LAB — LIPID PANEL: Cholesterol: 216 mg/dL — ABNORMAL HIGH (ref 0–200)

## 2010-12-30 NOTE — Progress Notes (Signed)
flp ,cmp done today Gailene Youkhana

## 2011-01-01 NOTE — Assessment & Plan Note (Signed)
Previously diet controlled, stopped diet modifications recently but will restart.  Start metformin today Lab Results  Component Value Date   HGBA1C 7.1 12/27/2010

## 2011-01-01 NOTE — Assessment & Plan Note (Signed)
Does not check BP at home.  Taking meds as rx.  Plans to increase exercise, modify diet.

## 2011-01-01 NOTE — Assessment & Plan Note (Signed)
Gained weight, slipped up with diet.  Reviewed diet and exercise.  Recheck Lipid panel

## 2011-01-07 NOTE — Assessment & Plan Note (Signed)
Advised to switch to tramadol tylenol instead of motrin since she is diabetic with HTN

## 2011-01-07 NOTE — Progress Notes (Signed)
  Subjective:    Patient ID: Deborah Simon, female    DOB: 07/29/1950, 60 y.o.   MRN: [id number]  HPI HYPERTENSION  Disease Monitoring: Blood pressure range-not checked at home Chest pain- none    Dyspnea- none  Medications: Compliance- takes as written Lightheadedness- none   Edema- none   DIABETES  Disease Monitoring: Blood Sugar ranges-does not check sugars Polyuria- denies New Visual problems- denies  Medications: Compliance- diet controlled Hypoglycemic symptoms- none    HYPERLIPIDEMIA  Disease Monitoring: See symptoms for Hypertension  Medications: Compliance- taking as written RUQ pain- none  Muscle aches- none    ROS See HPI above   PMH Smoking Status noted --never smoked  No alcohol use No exercise but plans to start  Back pain- only when she stands a lot.  Currently taking naproxen when it is bad.  Able to work.  No injury.  Aches.  No weakness or loss of sensation, no fever      Review of Systems See above    Objective:   Physical Exam Vital signs reviewed General appearance - alert, well appearing, and in no distress and oriented to person, place, and time Heart - normal rate, regular rhythm, normal S1, S2, no murmurs, rubs, clicks or gallops Chest - clear to auscultation, no wheezes, rales or rhonchi, symmetric air entry, no tachypnea, retractions or cyanosis Abdomen - soft, nontender, nondistended, no masses or organomegaly Extremities - peripheral pulses normal, no pedal edema, no clubbing or cyanosis Back-  Full ROM, mild TTP paraspinus muscles lumbar spine       Assessment & Plan:

## 2011-01-10 ENCOUNTER — Telehealth: Payer: Self-pay | Admitting: Family Medicine

## 2011-01-10 ENCOUNTER — Encounter: Payer: Self-pay | Admitting: Family Medicine

## 2011-01-10 NOTE — Telephone Encounter (Signed)
She only gets her schedule 1 week in advance.  Will check her calendar to see if she is available.  She is already changing her diet a little and exercising more.   She will call the office to schedule if she is available.

## 2011-01-24 ENCOUNTER — Other Ambulatory Visit (HOSPITAL_COMMUNITY): Payer: Self-pay | Admitting: *Deleted

## 2011-01-24 DIAGNOSIS — Z1231 Encounter for screening mammogram for malignant neoplasm of breast: Secondary | ICD-10-CM

## 2011-01-28 ENCOUNTER — Ambulatory Visit (HOSPITAL_COMMUNITY): Payer: Self-pay

## 2011-01-30 ENCOUNTER — Ambulatory Visit (HOSPITAL_COMMUNITY)
Admission: RE | Admit: 2011-01-30 | Discharge: 2011-01-30 | Disposition: A | Discharge status: Home / Self Care | Payer: Self-pay | Source: Ambulatory Visit | Attending: *Deleted | Admitting: *Deleted

## 2011-01-30 DIAGNOSIS — Z1231 Encounter for screening mammogram for malignant neoplasm of breast: Secondary | ICD-10-CM | POA: Insufficient documentation

## 2011-02-11 ENCOUNTER — Other Ambulatory Visit: Payer: Self-pay | Admitting: Family Medicine

## 2011-02-11 MED ORDER — METOPROLOL SUCCINATE ER 25 MG PO TB24: 25.0000 mg | ORAL_TABLET | Freq: Every day | ORAL | Status: DC

## 2011-02-28 ENCOUNTER — Ambulatory Visit (INDEPENDENT_AMBULATORY_CARE_PROVIDER_SITE_OTHER): Payer: Self-pay | Admitting: Family Medicine

## 2011-02-28 DIAGNOSIS — E119 Type 2 diabetes mellitus without complications: Secondary | ICD-10-CM

## 2011-02-28 DIAGNOSIS — K0889 Other specified disorders of teeth and supporting structures: Secondary | ICD-10-CM

## 2011-02-28 DIAGNOSIS — E785 Hyperlipidemia, unspecified: Secondary | ICD-10-CM

## 2011-02-28 DIAGNOSIS — K089 Disorder of teeth and supporting structures, unspecified: Secondary | ICD-10-CM

## 2011-02-28 MED ORDER — HYDROCODONE-ACETAMINOPHEN 5-500 MG PO TABS: 1.0000 | ORAL_TABLET | ORAL | Status: DC | PRN

## 2011-02-28 MED ORDER — METFORMIN HCL 1000 MG PO TABS: 1000.0000 mg | ORAL_TABLET | Freq: Two times a day (BID) | ORAL | Status: DC

## 2011-02-28 NOTE — Assessment & Plan Note (Signed)
Pain in upper left molar that has already been filled.  Pt to have tooth pulled Wednesday.  Will give vicodin until then

## 2011-02-28 NOTE — Patient Instructions (Signed)
Please come back in one month  Three days before your appt, make a lab appt Please make an appt for lab work in the AM Come without eating or drinking except for black coffee or water    I will give you some pain medication today for the poor tooth

## 2011-02-28 NOTE — Progress Notes (Signed)
  Subjective:    Patient ID: Deborah Simon, female    DOB: April 29, 1951, 60 y.o.   MRN: [id number]  HPI  Here for tooth pain in upper left molar.  Has had filling several years ago now loose.  No fevers,  Has pain with eating, pain keeping her up at night.  Has appt for extraction on wednesday  Review of Systems See above    Objective:   Physical Exam  Vital signs reviewed General appearance - alert, well appearing, and in no distress and oriented to person, place, and time Mouth- upper plate in place.  Back left upper molar with filling in place.  No surrounding erythema, edema or pus      Assessment & Plan:  Tooth pain Pain in upper left molar that has already been filled.  Pt to have tooth pulled Wednesday.  Will give vicodin until then

## 2011-03-03 ENCOUNTER — Other Ambulatory Visit: Payer: Self-pay | Admitting: Family Medicine

## 2011-03-03 MED ORDER — FLUTICASONE PROPIONATE 50 MCG/ACT NA SUSP: 1.0000 | Freq: Every day | NASAL | Status: DC

## 2011-03-04 ENCOUNTER — Telehealth: Payer: Self-pay | Admitting: Family Medicine

## 2011-03-04 DIAGNOSIS — K0889 Other specified disorders of teeth and supporting structures: Secondary | ICD-10-CM

## 2011-03-04 NOTE — Telephone Encounter (Signed)
Need dental referral  

## 2011-03-05 NOTE — Telephone Encounter (Signed)
Pt is still insisting on speaking with Dr Ayesha Mohair - she did not go to get her tooth pulled today

## 2011-03-05 NOTE — Telephone Encounter (Signed)
Actually patient needs to be on pain meds and antibiotic before we can refer.  They are only taking urgent cases.  Will let MD know Roch Quach, Maryjo Rochester

## 2011-03-05 NOTE — Telephone Encounter (Signed)
See order Deborah Simon  

## 2011-03-10 NOTE — Telephone Encounter (Signed)
Called pt to discuss dental referral.  She wants to be referred to dentist to be paid. Explained that she is not on abx so they wont take her.  Previous plan was for her to go to a cash dentist which she does not want to do anymore.  I told her to call the office and make an appt to see if the teeth looked worse and might need abx now, then we could refer to the dentist.  Pt agrees.

## 2011-03-11 ENCOUNTER — Ambulatory Visit (INDEPENDENT_AMBULATORY_CARE_PROVIDER_SITE_OTHER): Payer: Self-pay | Admitting: Family Medicine

## 2011-03-11 ENCOUNTER — Encounter: Payer: Self-pay | Admitting: Family Medicine

## 2011-03-11 VITALS — BP 134/76 | HR 82 | Temp 98.3°F | Wt 160.0 lb

## 2011-03-11 DIAGNOSIS — K089 Disorder of teeth and supporting structures, unspecified: Secondary | ICD-10-CM

## 2011-03-11 DIAGNOSIS — K0889 Other specified disorders of teeth and supporting structures: Secondary | ICD-10-CM

## 2011-03-11 MED ORDER — CLINDAMYCIN HCL 150 MG PO CAPS: ORAL_CAPSULE | ORAL | Status: AC

## 2011-03-11 NOTE — Assessment & Plan Note (Signed)
Will begin clindamycin to cover mouth flora, she is required to be on an antibiotic prior to dental referral, form was completed for urgent referral.  If she looses this tooth she will loose the ability to wear her upper place that is her front teeth.

## 2011-03-11 NOTE — Patient Instructions (Signed)
Take the antibiotics, they may cause diarrhea Eat a lot of yogurt, the plain is best Hopefully you will get an apt before November 1

## 2011-03-11 NOTE — Progress Notes (Signed)
  Subjective:    Patient ID: Deborah Simon, female    DOB: [birth year], 60 y.o.   MRN: [id number]  HPI  Painful right upper molar, was given pain meds by primary MD that help her but she needs to be on antibiotics for urgent dental clinic referral.  Has a partial built around this tooth.  Allergic to PCN  Review of Systems  Constitutional: Negative for fever.  HENT: Positive for facial swelling.        Objective:   Physical Exam  Constitutional: She appears well-developed and well-nourished.  HENT:       Left upper molar, only tooth in that area that is the anchor for her plate, is surrounded by inflamed gums tissue, some exudate, many fillings, loose, and painful.          Assessment & Plan:

## 2011-04-08 ENCOUNTER — Other Ambulatory Visit (INDEPENDENT_AMBULATORY_CARE_PROVIDER_SITE_OTHER): Payer: Self-pay

## 2011-04-08 DIAGNOSIS — E119 Type 2 diabetes mellitus without complications: Secondary | ICD-10-CM

## 2011-04-08 DIAGNOSIS — E785 Hyperlipidemia, unspecified: Secondary | ICD-10-CM

## 2011-04-08 LAB — LIPID PANEL
Cholesterol: 197 mg/dL (ref 0–200)
HDL: 48 mg/dL (ref >39)
Total CHOL/HDL Ratio: 4.1 Ratio
Triglycerides: 135 mg/dL (ref <150)
VLDL: 27 mg/dL (ref 0–40)

## 2011-04-08 LAB — COMPREHENSIVE METABOLIC PANEL
ALT: 21 U/L (ref 0–35)
CO2: 25 mEq/L (ref 19–32)
Creat: 0.8 mg/dL (ref 0.50–1.10)
Total Bilirubin: 0.5 mg/dL (ref 0.3–1.2)

## 2011-04-08 LAB — POCT GLYCOSYLATED HEMOGLOBIN (HGB A1C): Hemoglobin A1C: 6.4

## 2011-04-08 NOTE — Progress Notes (Signed)
CMP,FLP AND HGB A1C DONE TODAY Deborah Simon

## 2011-04-11 ENCOUNTER — Encounter: Payer: Self-pay | Admitting: Family Medicine

## 2011-04-16 ENCOUNTER — Ambulatory Visit: Payer: Self-pay | Admitting: Family Medicine

## 2011-04-21 ENCOUNTER — Ambulatory Visit (INDEPENDENT_AMBULATORY_CARE_PROVIDER_SITE_OTHER): Payer: Self-pay | Admitting: Family Medicine

## 2011-04-21 DIAGNOSIS — R109 Unspecified abdominal pain: Secondary | ICD-10-CM | POA: Insufficient documentation

## 2011-04-21 LAB — CBC WITH DIFFERENTIAL/PLATELET
Basophils Relative: 0 % (ref 0–1)
Eosinophils Absolute: 0.2 10*3/uL (ref 0.0–0.7)
MCH: 30.1 pg (ref 26.0–34.0)
MCHC: 32.4 g/dL (ref 30.0–36.0)
Neutrophils Relative %: 52 % (ref 43–77)
Platelets: 412 10*3/uL — ABNORMAL HIGH (ref 150–400)
RBC: 3.92 MIL/uL (ref 3.87–5.11)
RDW: 14 % (ref 11.5–15.5)

## 2011-04-21 LAB — COMPREHENSIVE METABOLIC PANEL
Albumin: 4.8 g/dL (ref 3.5–5.2)
Alkaline Phosphatase: 89 U/L (ref 39–117)
Calcium: 9.8 mg/dL (ref 8.4–10.5)
Creat: 0.67 mg/dL (ref 0.50–1.10)
Sodium: 140 mEq/L (ref 135–145)
Total Bilirubin: 0.4 mg/dL (ref 0.3–1.2)

## 2011-04-21 LAB — LIPASE: Lipase: 97 U/L — ABNORMAL HIGH (ref 0–75)

## 2011-04-21 MED ORDER — OMEPRAZOLE 40 MG PO CPDR: 40.0000 mg | DELAYED_RELEASE_CAPSULE | Freq: Every day | ORAL | Status: DC

## 2011-04-21 NOTE — Assessment & Plan Note (Addendum)
Relatively broad differential diagnosis for this including gastritis/reflux, cholecystitis, pancreatitis. Will obtain CBC, CMET, lipase, as well as abdominal ultrasound. Will place patient on full liquid diet in the interim as well as medium dose Prilosec. Diabetic gastroparesis is relatively low on the differential diagnosis given the acuity of symptoms. Although, patient may benefit from gastric emptying study if these symptoms persist into the coming months.

## 2011-04-21 NOTE — Patient Instructions (Signed)
It was good to see today I will start start you on a full liquid diet to help with the abdominal pain I'm also start her on high dose Prilosec as it may be a reflux component to this Followup with your regular Dr. Julienne Kass in the next 1-2 weeks It is anything abnormal on your blood work I will call and let you know Otherwise call if any questions God Bless, Doree Albee MD   Full Liquid Diet The full liquid diet includes those foods that are liquid or will become liquid at body temperature. This diet is very restrictive. Its use should be limited to a short period of time and only under the advice or supervision of your caregiver or dietitian.  A high-calorie, high-protein supplement should be used to meet your nutritional requirements when the full liquid diet is continued for more than 2 or 3 days. If this diet is to be used for an extended period of time (more than 7 days), a multivitamin should be considered. REASONS FOR USE  As a transition diet between the clear liquid diet and solid foods.   When patients cannot tolerate solid foods.  ADEQUACY The full liquid diet is nutritionally inadequate according to the Recommended Dietary Allowances of the [employer], except in ascorbic acid and calcium. Protein requirements can be met if adequate amounts of dairy products are consumed daily. The full liquid diet can be nutritionally adequate if it is fortified with a nutritional supplement. Your caregiver can give you recommendations on liquids that have nutritional supplements included in them. CHOOSING FOODS Breads and Starches  Allowed: None are allowed except crackers that are pureed (made into a thick, smooth soup) in soup. Cooked, refined corn, oat, rice, rye, and wheat cereals are also allowed.   Avoid: Any others.  Potatoes/Pasta/Rice  Allowed: None except pureed in soup.   Avoid: Any others.  Vegetables  Allowed: Strained tomato or vegetable juice. Vegetables  pureed in soup.   Avoid: Any others.  Fruit  Allowed: Any strained fruit juices and fruit drinks. Include 1 serving of citrus or vitamin C-enriched fruit juice daily.   Avoid: Any others.  Meat and Meat Substitutes  Allowed: Eggs in custard, eggnog mix, eggs used in ice cream or pudding.   Avoid: Any meat, fish, or fowl. All cheese. All other cooked or raw eggs.  Milk  Allowed: Milk and milk-based beverages, including milk shakes and instant breakfast mixes. Smooth yogurt.   Avoid: Any others. Avoid dairy products if not tolerated.  Soups and Combination Foods  Allowed: Broth, strained cream soups. Strained, broth-based soups.   Avoid: Any others.  Desserts and Sweets  Allowed: Custard, flavored gelatin, tapioca, plain ice cream, sherbet, smooth pudding, junket, fruit ices, frozen ice pops, pudding pops. Other frozen bars with cream, frozen fudge pops, chocolate syrup. Sugar, honey, jelly, syrup.   Avoid: Any others.  Fats and Oils  Allowed: Margarine, butter, cream, sour cream, oils.   Avoid: Any others.  Beverages  Allowed: All.   Avoid: None.  Condiments  Allowed: Iodized salt, pepper, spices, flavorings. Cocoa powder.   Avoid: Any others.  SAMPLE MEAL PLAN Breakfast   cup orange juice.   1 cup cooked wheat cereal.   1 cup milk.   1 cup beverage (coffee or tea).   Cream or sugar, if desired.  Midmorning Snack  1 cup pasteurized eggnog (made from powdered eggs mixed with milk, not raw eggs).  Lunch  1 cup cream soup.  cup fruit juice.   1 cup milk.    cup custard.   1 cup beverage (coffee or tea).   Cream or sugar, if desired.  Midafternoon Snack  1 cup milk shake.  Dinner  1 cup cream soup.    cup fruit juice.   1 cup milk.    cup pudding.   1 cup beverage (coffee or tea).   Cream or sugar, if desired.  Evening Snack  1 cup supplement.  To increase calories, add sugar, cream, butter, or margarine if possible.  Nutritional supplements will also increase the total calories. The above sample meal plan cannot meet the Recommended Dietary Allowances of the [employer] without appropriate supplementation under the guidance of your caregiver or dietitian. Document Released: 06/02/2005 Document Revised: 02/12/2011 Document Reviewed: 03/05/2007 [facility] Patient Information 2012 [place], [place].

## 2011-04-21 NOTE — Progress Notes (Signed)
  Subjective:    Patient ID: Deborah Simon, female    DOB: 04-03-1951, 60 y.o.   MRN: [id number]  HPI Abdominal pain x 2 days.   60 year old patient with a baseline history of diabetes which patient was started on metformin for this approximately 3 months ago. Most recent A1c was 6.4. Patient states she noticed severe abdominal pain beginning around yesterday. Pain persisted throughout the day with one episode of emesis at night. Emesis was nonbloody nonbilious. Patient states she woke up in the morning with persistence of abdominal pain. Patient said she was able to tolerate liquids and a bowl of grits this morning. No fevers, recent sick contacts. Patient denies any previous episodes of this in the past. No dysuria or history of kidney stones. No history of gallbladder disease. Patient is a nonsmoker.  Abdominal pain is epigastric in distibution with some radiation to the back as well as some mild right upper quadrant pain. Bowel movements have been relatively stable in the setting of metformin use.   Review of Systems See history of present illness, otherwise 12 point review of systems negative    Objective:   Physical Exam Gen: in chair, NAD CV: RRR, no rubs, gallops, ,murmurs ABD: + TTP in epigastrium and RUQ, no flank or suprapubic pain.        Assessment & Plan:

## 2011-04-22 ENCOUNTER — Telehealth: Payer: Self-pay | Admitting: Family Medicine

## 2011-04-22 NOTE — Telephone Encounter (Signed)
Pt states it was the omeprazole and wants to talk to nurse

## 2011-04-22 NOTE — Telephone Encounter (Signed)
Waiting for pt to call back. please ask pt, which medication. (is it Prilosec?) pt to f/up with her PCP in one week. Lorenda Hatchet, Renato Battles

## 2011-04-22 NOTE — Telephone Encounter (Signed)
Ms. Barros got the Rx filled, which was very expensive.  She took one pill and it gave her a terrible headache.  She was hoping that Dr. Alvester Morin might be able to suggest or give her samples for something else to try.

## 2011-04-22 NOTE — Telephone Encounter (Signed)
Pt. Called back and states it was Prilosec.  Will forward to Dr. Alvester Morin & call Pt. Back. Altamese Dilling

## 2011-04-24 ENCOUNTER — Telehealth: Payer: Self-pay | Admitting: Family Medicine

## 2011-04-24 NOTE — Telephone Encounter (Signed)
[name] wanted to inform provider that she cancelled the appt she was sched'd for.  Do not think she need it now.

## 2011-04-25 ENCOUNTER — Other Ambulatory Visit (HOSPITAL_COMMUNITY): Payer: Self-pay

## 2011-05-12 ENCOUNTER — Ambulatory Visit (INDEPENDENT_AMBULATORY_CARE_PROVIDER_SITE_OTHER): Payer: Self-pay | Admitting: Family Medicine

## 2011-05-12 ENCOUNTER — Encounter: Payer: Self-pay | Admitting: Family Medicine

## 2011-05-12 DIAGNOSIS — R109 Unspecified abdominal pain: Secondary | ICD-10-CM

## 2011-05-12 DIAGNOSIS — Z23 Encounter for immunization: Secondary | ICD-10-CM

## 2011-05-12 DIAGNOSIS — E119 Type 2 diabetes mellitus without complications: Secondary | ICD-10-CM

## 2011-05-12 DIAGNOSIS — I10 Essential (primary) hypertension: Secondary | ICD-10-CM

## 2011-05-12 DIAGNOSIS — D649 Anemia, unspecified: Secondary | ICD-10-CM

## 2011-05-12 LAB — CBC
Hemoglobin: 11.6 g/dL — ABNORMAL LOW (ref 12.0–15.0)
MCH: 30.8 pg (ref 26.0–34.0)
MCHC: 32.3 g/dL (ref 30.0–36.0)
MCV: 95.2 fL (ref 78.0–100.0)
RBC: 3.77 MIL/uL — ABNORMAL LOW (ref 3.87–5.11)

## 2011-05-12 MED ORDER — METFORMIN HCL 500 MG PO TABS: 500.0000 mg | ORAL_TABLET | Freq: Two times a day (BID) | ORAL | Status: DC

## 2011-05-12 NOTE — Progress Notes (Signed)
Subjective:    Patient ID: Deborah Simon, female    DOB: [birth year], 60 y.o.   MRN: [id number]  HPI Abdominal pain-patient noted resolution of abdominal pain 1 day after last visit. She did not go to her ultrasound appointment. She has not had any further abdominal pain since this time. She denies any changes in her stool color or consistency since she changed her metformin dose. She says that on her 1000 mg of metformin twice a day, she noted several bowel movements a day. Now she only goes once a day. Her stools are not dark. She does not have any abdominal pain after eating unless she eats a lot of oranges or other acidic fruits. She had no trouble with any of the food that she ate for Thanksgiving, and she states that she ate multiple pieces of pie, and other Thanksgiving treats.  Hypertension-patient states that she's been eating multiple portions is fatty and salty foods over the last week. She states that she'll try to get back to her normal diet after this point. She does not want to change her medicines at this time since she thinks this is all diet. She denies any headaches or shortness of breath or chest pain  Diabetes-patient has been eating many high carb, high fat, high salt foods over the Thanksgiving weekend. We discussed limiting her intake of these kind of foods to the day of the holiday and trying to get on a regular diet the other days. She would like to be on 500 twice a day of metformin since this is better tolerated. She is not checking her blood sugars at home.   Review of Systems Denies feeling of low blood sugar, headache, shortness of breath, chest pain    Objective:   Physical Exam  Vital signs reviewed General appearance - alert, well appearing, and in no distress and oriented to person, place, and time Heart - normal rate, regular rhythm, normal S1, S2, no murmurs, rubs, clicks or gallops Neck - supple, no significant adenopathy has enlargement of the muscles of the  sternocleidomastoid. No enlargement of her thyroid Abdomen - soft, nontender, nondistended, no masses or organomegaly able to deeply palpate with no tenderness.         Assessment & Plan:  Abdominal pain Patient had resolution of her abdominal pain the day after her visit. She has not had any recurrence of her abdominal pain and she canceled her ultrasound. I reviewed her note for her previous encounter and I reviewed her laboratory results. Her laboratory results were concerning for an elevated lipase as well as a mild anemia. Her last period was many years ago. Since she had abdominal pain that may have been caused by an ulcer, I will check her CBC today to make sure she is not anemic. If she is anemic on recheck, I would like to pursue further workup. I think this is likely related to gallstones or food poisoning rather than ulcer considering and had complete resolution.  DIABETES MELLITUS, TYPE II The patient has reduced her dose of metformin to 500 twice a day because she had trouble tolerating the dose at 1000 twice a day. She experiences frequent bowel movements on 1000 twice a day. She would like to go back to 500 twice a day. We will recheck her A1c in January.  HYPERTENSION Blood pressure elevated today. Patient states she consumed a large quantity of salty foods over the Thanksgiving weekend. She states that she will try to watch her diet.  She would like to have her blood pressure rechecked after the holidays. We will see her back in January. I have agreed to not make any changes until then.

## 2011-05-12 NOTE — Assessment & Plan Note (Signed)
Blood pressure elevated today. Patient states she consumed a large quantity of salty foods over the Thanksgiving weekend. She states that she will try to watch her diet. She would like to have her blood pressure rechecked after the holidays. We will see her back in January. I have agreed to not make any changes until then.

## 2011-05-12 NOTE — Assessment & Plan Note (Signed)
Patient had resolution of her abdominal pain the day after her visit. She has not had any recurrence of her abdominal pain and she canceled her ultrasound. I reviewed her note for her previous encounter and I reviewed her laboratory results. Her laboratory results were concerning for an elevated lipase as well as a mild anemia. Her last period was many years ago. Since she had abdominal pain that may have been caused by an ulcer, I will check her CBC today to make sure she is not anemic. If she is anemic on recheck, I would like to pursue further workup. I think this is likely related to gallstones or food poisoning rather than ulcer considering and had complete resolution.

## 2011-05-12 NOTE — Patient Instructions (Signed)
Today we are rechecking your blood counts. If they are low, I will call you. If they are normal I will send a letter. Please call us if you have not heard anything in 2 weeks.  If you have more abdominal pain please call us and be seen. Please try to watch your salt and fatty food intake over the holidays to help your blood pressure and your diabetes. It's okay to indulge on the day of the holiday but try to watch out for all the days around the holiday. I will see you in January for recheck.

## 2011-05-12 NOTE — Assessment & Plan Note (Signed)
The patient has reduced her dose of metformin to 500 twice a day because she had trouble tolerating the dose at 1000 twice a day. She experiences frequent bowel movements on 1000 twice a day. She would like to go back to 500 twice a day. We will recheck her A1c in January.

## 2011-05-14 ENCOUNTER — Encounter: Payer: Self-pay | Admitting: Family Medicine

## 2011-05-22 ENCOUNTER — Other Ambulatory Visit: Payer: Self-pay | Admitting: Family Medicine

## 2011-05-22 MED ORDER — OLMESARTAN MEDOXOMIL-HCTZ 40-12.5 MG PO TABS: 1.0000 | ORAL_TABLET | Freq: Every day | ORAL | Status: DC

## 2011-06-02 ENCOUNTER — Other Ambulatory Visit: Payer: Self-pay | Admitting: Family Medicine

## 2011-06-02 MED ORDER — FLUTICASONE PROPIONATE 50 MCG/ACT NA SUSP: 1.0000 | Freq: Every day | NASAL | Status: DC

## 2011-06-12 ENCOUNTER — Telehealth: Payer: Self-pay | Admitting: Family Medicine

## 2011-06-12 NOTE — Telephone Encounter (Signed)
[facility] no longer carry the Avalide 12.5 mg and Ms. Jacquot need you to write a new rx to take to [facility] on [address]  They have never filled this before.  If it can be faxed over to them, please let her know.  She only have one tab left. She will be home at number given for contact until 3:30

## 2011-06-12 NOTE — Telephone Encounter (Signed)
Ms. Charrette is needing a refill from [facility] on her Blood Pressure Pill.  She would like to speak to someone about having it sent to [facility] since she is so low.  It is ok to leave a voicemail.

## 2011-06-12 NOTE — Telephone Encounter (Signed)
Budd Palmer is the name of the meds that came from [facility]

## 2011-06-12 NOTE — Telephone Encounter (Signed)
Spoke with patient she only needs one of her blood pressure medicines sent to [facility] because [facility] doesn't have it. She is unsure which and will call back when she gets home with the name of med. Wants it to go to [facility] on [address]

## 2011-06-18 ENCOUNTER — Other Ambulatory Visit: Payer: Self-pay | Admitting: Family Medicine

## 2011-06-18 MED ORDER — IRBESARTAN-HYDROCHLOROTHIAZIDE 300-12.5 MG PO TABS: 1.0000 | ORAL_TABLET | Freq: Every day | ORAL | Status: DC

## 2011-06-18 NOTE — Telephone Encounter (Signed)
Patient informed, expressed understanding. 

## 2011-06-18 NOTE — Telephone Encounter (Signed)
Sent in medication

## 2011-07-15 ENCOUNTER — Other Ambulatory Visit: Payer: Self-pay | Admitting: Family Medicine

## 2011-08-28 DIAGNOSIS — E119 Type 2 diabetes mellitus without complications: Secondary | ICD-10-CM | POA: Insufficient documentation

## 2011-08-28 DIAGNOSIS — R109 Unspecified abdominal pain: Secondary | ICD-10-CM | POA: Insufficient documentation

## 2011-08-28 DIAGNOSIS — I10 Essential (primary) hypertension: Secondary | ICD-10-CM | POA: Insufficient documentation

## 2011-08-28 DIAGNOSIS — R10819 Abdominal tenderness, unspecified site: Secondary | ICD-10-CM | POA: Insufficient documentation

## 2011-08-28 DIAGNOSIS — Z79899 Other long term (current) drug therapy: Secondary | ICD-10-CM | POA: Insufficient documentation

## 2011-08-28 DIAGNOSIS — F17200 Nicotine dependence, unspecified, uncomplicated: Secondary | ICD-10-CM | POA: Insufficient documentation

## 2011-08-29 ENCOUNTER — Encounter (HOSPITAL_COMMUNITY): Payer: Self-pay | Admitting: *Deleted

## 2011-08-29 ENCOUNTER — Emergency Department (HOSPITAL_COMMUNITY)
Admission: EM | Admit: 2011-08-29 | Discharge: 2011-08-29 | Disposition: A | Discharge status: Home / Self Care | Payer: Self-pay | Attending: Emergency Medicine | Admitting: Emergency Medicine

## 2011-08-29 ENCOUNTER — Emergency Department (HOSPITAL_COMMUNITY): Payer: Self-pay

## 2011-08-29 DIAGNOSIS — R109 Unspecified abdominal pain: Secondary | ICD-10-CM

## 2011-08-29 HISTORY — DX: Essential (primary) hypertension: I10

## 2011-08-29 LAB — URINALYSIS, ROUTINE W REFLEX MICROSCOPIC
Bilirubin Urine: NEGATIVE
Glucose, UA: NEGATIVE mg/dL
Hgb urine dipstick: NEGATIVE
Ketones, ur: NEGATIVE mg/dL
Protein, ur: NEGATIVE mg/dL
pH: 6 (ref 5.0–8.0)

## 2011-08-29 LAB — DIFFERENTIAL
Basophils Relative: 0 % (ref 0–1)
Eosinophils Absolute: 0.3 10*3/uL (ref 0.0–0.7)
Lymphs Abs: 4.2 10*3/uL — ABNORMAL HIGH (ref 0.7–4.0)
Monocytes Relative: 6 % (ref 3–12)
Neutro Abs: 5.4 10*3/uL (ref 1.7–7.7)
Neutrophils Relative %: 52 % (ref 43–77)

## 2011-08-29 LAB — CBC
HCT: 35.5 % — ABNORMAL LOW (ref 36.0–46.0)
Hemoglobin: 12 g/dL (ref 12.0–15.0)
MCH: 30.8 pg (ref 26.0–34.0)
MCHC: 33.8 g/dL (ref 30.0–36.0)
MCV: 91 fL (ref 78.0–100.0)
RDW: 13.2 % (ref 11.5–15.5)

## 2011-08-29 LAB — COMPREHENSIVE METABOLIC PANEL
ALT: 21 U/L (ref 0–35)
Albumin: 4.1 g/dL (ref 3.5–5.2)
Alkaline Phosphatase: 78 U/L (ref 39–117)
Chloride: 99 mEq/L (ref 96–112)
Glucose, Bld: 121 mg/dL — ABNORMAL HIGH (ref 70–99)
Potassium: 3.7 mEq/L (ref 3.5–5.1)
Sodium: 137 mEq/L (ref 135–145)
Total Bilirubin: 0.2 mg/dL — ABNORMAL LOW (ref 0.3–1.2)
Total Protein: 7.5 g/dL (ref 6.0–8.3)

## 2011-08-29 LAB — URINE MICROSCOPIC-ADD ON

## 2011-08-29 MED ORDER — HYDROMORPHONE HCL PF 1 MG/ML IJ SOLN
1.0000 mg | Freq: Once | INTRAMUSCULAR | Status: AC
  Administered 2011-08-29: 1 mg via INTRAVENOUS
  Filled 2011-08-29: qty 1

## 2011-08-29 MED ORDER — PROMETHAZINE HCL 25 MG PO TABS: 25.0000 mg | ORAL_TABLET | Freq: Four times a day (QID) | ORAL | Status: DC | PRN

## 2011-08-29 MED ORDER — NAPROXEN 500 MG PO TABS: 500.0000 mg | ORAL_TABLET | Freq: Two times a day (BID) | ORAL | Status: DC

## 2011-08-29 MED ORDER — OXYCODONE-ACETAMINOPHEN 5-325 MG PO TABS: 1.0000 | ORAL_TABLET | ORAL | Status: DC | PRN

## 2011-08-29 NOTE — ED Notes (Signed)
She has abd pain since 1900 the pain started after she ate a hot dog at work.  No nv or diarrhea.

## 2011-08-29 NOTE — ED Provider Notes (Addendum)
History     CSN: [id number]  Arrival date & time 08/28/11  2352   First MD Initiated Contact with Patient 08/29/11 [phone]      Chief Complaint  Patient presents with  . Abdominal Pain    (Consider location/radiation/quality/duration/timing/severity/associated sxs/prior treatment) HPI Comments: 61 year old female with a history of hypertension and diabetes, upper abdominal pain symptoms started after eating chili hot dog at work approximately 9 hours prior to arrival. Pain has been fluctuating in intensity, persistent, not associated with fevers chills nausea vomiting or diarrhea.  Patient is a 61 y.o. female presenting with abdominal pain. The history is provided by the patient and the spouse.  Abdominal Pain The primary symptoms of the illness include abdominal pain. The primary symptoms of the illness do not include fever, fatigue, shortness of breath, nausea, vomiting, diarrhea, hematemesis, hematochezia or dysuria.    Past Medical History  Diagnosis Date  . Hypertension   . Diabetes mellitus     History reviewed. No pertinent past surgical history.  History reviewed. No pertinent family history.  History  Substance Use Topics  . Smoking status: Passive Smoker  . Smokeless tobacco: Never Used  . Alcohol Use: No    OB History    Grav Para Term Preterm Abortions TAB SAB Ect Mult Living                  Review of Systems  Constitutional: Negative for fever and fatigue.  Respiratory: Negative for shortness of breath.   Gastrointestinal: Positive for abdominal pain. Negative for nausea, vomiting, diarrhea, hematochezia and hematemesis.  Genitourinary: Negative for dysuria.  All other systems reviewed and are negative.    Allergies  Penicillins  Home Medications   Current Outpatient Rx  Name Route Sig Dispense Refill  . CYANOCOBALAMIN 500 MCG PO TABS Oral Take 500 mcg by mouth daily.    Marland Kitchen FLUTICASONE PROPIONATE 50 MCG/ACT NA SUSP Nasal Place 1 spray into the  nose daily. 16 g 11  . GARLIC OIL 1000 MG PO CAPS Oral Take 1,000 mg by mouth daily.    . IRBESARTAN-HYDROCHLOROTHIAZIDE 300-12.5 MG PO TABS Oral Take 1 tablet by mouth daily. 30 tablet 11  . METFORMIN HCL 500 MG PO TABS Oral Take 1 tablet (500 mg total) by mouth 2 (two) times daily with a meal. 60 tablet 11  . METOPROLOL SUCCINATE ER 25 MG PO TB24 Oral Take 1 tablet (25 mg total) by mouth daily. 30 tablet 11  . FISH OIL 1000 MG PO CAPS Oral Take 1,000 mg by mouth daily.    Marland Kitchen POTASSIUM GLUCONATE PO Oral Take 1 tablet by mouth every morning.    Marland Kitchen NAPROXEN 500 MG PO TABS Oral Take 1 tablet (500 mg total) by mouth 2 (two) times daily with a meal. 30 tablet 0  . OXYCODONE-ACETAMINOPHEN 5-325 MG PO TABS Oral Take 1 tablet by mouth every 4 (four) hours as needed for pain. May take 2 tablets PO q 6 hours for severe pain - Do not take with Tylenol as this tablet already contains tylenol 15 tablet 0  . PROMETHAZINE HCL 25 MG PO TABS Oral Take 1 tablet (25 mg total) by mouth every 6 (six) hours as needed for nausea. 12 tablet 0    BP 157/68  Pulse 52  Temp(Src) 98 F (36.7 C) (Oral)  Resp 20  SpO2 100%  Physical Exam  Nursing note and vitals reviewed. Constitutional: She appears well-developed and well-nourished. No distress.  HENT:  Head: Normocephalic and  atraumatic.  Mouth/Throat: Oropharynx is clear and moist. No oropharyngeal exudate.  Eyes: Conjunctivae and EOM are normal. Pupils are equal, round, and reactive to light. Right eye exhibits no discharge. Left eye exhibits no discharge. No scleral icterus.  Neck: Normal range of motion. Neck supple. No JVD present. No thyromegaly present.  Cardiovascular: Normal rate, regular rhythm, normal heart sounds and intact distal pulses.  Exam reveals no gallop and no friction rub.   No murmur heard. Pulmonary/Chest: Effort normal and breath sounds normal. No respiratory distress. She has no wheezes. She has no rales.  Abdominal: Soft. Bowel sounds are  normal. She exhibits no distension and no mass. There is tenderness ( Mild right upper quadrant, epigastric and left upper quadrant tenderness, no guarding, no pulsating masses).  Musculoskeletal: Normal range of motion. She exhibits no edema and no tenderness.  Lymphadenopathy:    She has no cervical adenopathy.  Neurological: She is alert. Coordination normal.  Skin: Skin is warm and dry. No rash noted. No erythema.  Psychiatric: She has a normal mood and affect. Her behavior is normal.    ED Course  Procedures (including critical care time)  Labs Reviewed  URINALYSIS, ROUTINE W REFLEX MICROSCOPIC - Abnormal; Notable for the following:    Leukocytes, UA SMALL (*)    All other components within normal limits  CBC - Abnormal; Notable for the following:    HCT 35.5 (*)    All other components within normal limits  DIFFERENTIAL - Abnormal; Notable for the following:    Lymphs Abs 4.2 (*)    All other components within normal limits  COMPREHENSIVE METABOLIC PANEL - Abnormal; Notable for the following:    Glucose, Bld 121 (*)    Total Bilirubin 0.2 (*)    All other components within normal limits  LIPASE, BLOOD  URINE MICROSCOPIC-ADD ON   US Abdomen Complete  08/29/2011  *RADIOLOGY REPORT*  Clinical Data:  Abdominal pain  COMPLETE ABDOMINAL ULTRASOUND  Comparison:  None.  Findings:  Gallbladder:  Cholelithiasis.  No gallbladder wall thickening.  No pericholecystic fluid. Negative sonographic Murphy's sign.  Common bile duct:  Measures up to 7 mm proximally and tapers smoothly.  Liver:  No focal lesion identified.  Within normal limits in parenchymal echogenicity.  IVC:  Appears normal.  Pancreas:  No focal abnormality identified within the head neck or proximal body.  The distal body and tail are obscured.  Spleen:  Measures 7.2 cm, within normal limits.  Right Kidney:  Measures 10.5 cm.  Normal appearance.  No hydronephrosis.  Left Kidney:  Measures 10.1 cm.  No hydronephrosis.  Normal  appearance.  Abdominal aorta:  No aneurysmal dilatation, measures up to 2.0 cm.  IMPRESSION: Cholelithiasis.  No sonographic evidence for cholecystitis.  Original Report Authenticated By: Waneta Martins, M.D.     1. Abdominal  pain, other specified site       MDM  Patient is well appearing, mild tenderness, patient states that pain is 8/10, has a normal metabolic panel, liver function tests and lipase as well as a normal white blood cell count. Due to persistent pain will rule out cholecystitis with ultrasound.  Patient has been ambulatory without difficulty, repeat abdominal exam shows no abdominal tenderness, ultrasound of the abdomen shows cholelithiasis without cholecystitis. Given normal laboratory findings, nonspecific left and right upper quadrant abdominal pain and a normal physical exam at this point I feel this is nonspecific abdominal pain and she can followup with her family Dr. I as communicated  the results of her exam to her and she will followup as indicated.      Vida Roller, MD 08/29/11 1610  Vida Roller, MD 08/29/11 [phone]

## 2011-08-29 NOTE — Discharge Instructions (Signed)
Today your blood work has been normal and your ultrasound showed that you have gallstones. This can be a normal finding and it does not appear that your gallstones are causing trouble today. If you have recurrent abdominal pain that lasts for more than 4 hours, return to the emergency department immediately.  You have been diagnosed with undifferentiated abdominal pain.  Abdominal pain can be caused by many things. Your caregiver evaluates the seriousness of your pain by an examination and possibly blood or urine tests and imaging (CT scan, x-rays, ultrasound). Many cases can be observed and treated at home after initial evaluation in the emergency department. Even though you are being discharged home, abdominal pain can be unpredictable. Therefore, you need a repeat exam if your pain does not resolve, returns, or worsens. Most patient's with abdominal pain do not need to be admitted to the hospital or have surgery, but serious problems like appendicitis and gallbladder attacks can start out as nonspecific pain. Many abdominal conditions cannot be diagnosed in 1 visit, so followup evaluations are very important.  Seek immediate medical attention if:  *The pain does not go away or becomes severe. *Temperature above 101 develops *Repeated vomiting occurs(multiple episodes) *The pain becomes localized to portions of the abdomen. The right side could possibly be appendicitis. In an adult, the left lower portion of the abdomen could be colitis or diverticulitis. *Blood is being passed in stools or vomit *Return also if you develop chest pain, difficulty breathing, dizziness or fainting, or become confused poorly responsive or inconsolable (young children).     If you do not have a physician, you should reference the below phone numbers and call in the morning to establish follow up care.  RESOURCE GUIDE  Dental Problems  Patients with Medicaid: [facility] [phone]                                           [phone] Phone:  [phone]                                                  Phone:  [phone]  If unable to pay or uninsured, contact:  [facility] or [facility]. to become qualified for the adult dental clinic.  Chronic Pain Problems Contact [facility]  [phone] Patients need to be referred by their primary care doctor.  Insufficient Money for Medicine Contact [employer]:  call "211" or [facility] 405-849-8270.  No Primary Care Doctor Call [facility]  [phone] Other agencies that provide inexpensive medical care    [facility]  [phone]    [facility] Internal Medicine  [phone]    [facility]  9806998507    [facility]  [phone]    [facility]  [phone]    [facility]  [phone]  Psychological Services [facility]  [phone] [facility] Services  [phone] [facility] Mental Health   [phone] (emergency services (613)[phone])  Substance Abuse Resources Alcohol and Drug Services  [phone])[phone] [facility] 804-314-2030  [facility] [phone] [facility] 4066330580 Residential & Outpatient Substance Abuse Program  [phone]  Abuse/Neglect [facility] Child Abuse Hotline (208) 605-6007 [facility] Child Abuse Hotline (215) 486-0086 (After Hours)  Emergency Shelter [facility] (575)849-9037  Maternity Homes Room at the [place] [facility] (205)238-5854 [facility] (312)578-5494  MRSA Hotline #:   (314)392-6439    [facility] Resources  [facility]     [employer]                          [facility] Dept. [address]                        [address]      [address]  Blondell Reveal Phone:  [phone]                                   Phone:  [phone]                 Phone:  [phone]  [facility] Mental Health Phone:  [phone]  [facility] Child Abuse Hotline (272)862-2042 (239)234-0580 (After Hours)

## 2011-09-01 ENCOUNTER — Encounter: Payer: Self-pay | Admitting: Family Medicine

## 2011-09-01 ENCOUNTER — Ambulatory Visit (INDEPENDENT_AMBULATORY_CARE_PROVIDER_SITE_OTHER): Payer: Self-pay | Admitting: Family Medicine

## 2011-09-01 DIAGNOSIS — K8020 Calculus of gallbladder without cholecystitis without obstruction: Secondary | ICD-10-CM

## 2011-09-01 DIAGNOSIS — E785 Hyperlipidemia, unspecified: Secondary | ICD-10-CM

## 2011-09-01 DIAGNOSIS — K8050 Calculus of bile duct without cholangitis or cholecystitis without obstruction: Secondary | ICD-10-CM

## 2011-09-01 DIAGNOSIS — E119 Type 2 diabetes mellitus without complications: Secondary | ICD-10-CM

## 2011-09-01 DIAGNOSIS — I10 Essential (primary) hypertension: Secondary | ICD-10-CM

## 2011-09-01 DIAGNOSIS — K8010 Calculus of gallbladder with chronic cholecystitis without obstruction: Secondary | ICD-10-CM | POA: Insufficient documentation

## 2011-09-01 LAB — COMPREHENSIVE METABOLIC PANEL
ALT: 19 U/L (ref 0–35)
AST: 19 U/L (ref 0–37)
Alkaline Phosphatase: 77 U/L (ref 39–117)
Potassium: 4.3 mEq/L (ref 3.5–5.3)
Sodium: 141 mEq/L (ref 135–145)
Total Bilirubin: 0.4 mg/dL (ref 0.3–1.2)
Total Protein: 7.1 g/dL (ref 6.0–8.3)

## 2011-09-01 LAB — LDL CHOLESTEROL, DIRECT: Direct LDL: 131 mg/dL — ABNORMAL HIGH

## 2011-09-01 MED ORDER — PRAVASTATIN SODIUM 40 MG PO TABS: 40.0000 mg | ORAL_TABLET | Freq: Every day | ORAL | Status: DC

## 2011-09-01 NOTE — Assessment & Plan Note (Signed)
Lab Results  Component Value Date   HGBA1C 6.4 09/01/2011   A1c at goal on 500 mg metformin twice a day. To continue this plan.

## 2011-09-01 NOTE — Patient Instructions (Signed)
Please stop the naproxen, percocet and phenergan  We will send you to the surgeon for evaluation.  They may not want to do anything, but we will let them check you out.  Please start pravachol.    Please get a diabetic eye exam--if you can do this at [facility], that's fine.  Please send [place] the report.  Come back in 1-2 weeks for a blood pressure recheck

## 2011-09-01 NOTE — Progress Notes (Signed)
  Subjective:    Patient ID: Deborah Simon, female    DOB: 1950-10-22, 61 y.o.   MRN: [id number]  HPI Abdominal pain-patient was seen in the emergency department for this. This started after she ate a chili dog. She was seen to have normal CBC, CMET.  She did have cholelithiasis on ultrasound. This is her third episode of similar abdominal pain after heavy food. She denies fever this. She has not had pain since she left the ED and has not been using her pain medicines.   HYPERTENSION Disease Monitoring Blood pressure range-does not check at home Chest pain- none      Dyspnea- none Medications Compliance- taking Lightheadedness- none   Edema- none   DIABETES Disease Monitoring Blood Sugar ranges-does not check at home Polyuria- none New Visual problems- none Medications Compliance- taking Hypoglycemic symptoms- none   HYPERLIPIDEMIA Disease Monitoring See symptoms for Hypertension Medications Compliance- starting today RUQ pain- none  Muscle aches- none  ROS See HPI above   PMH Smoking Status noted    Review of Systems See above    Objective:   Physical Exam Vital signs reviewed General appearance - alert, well appearing, and in no distress and oriented to person, place, and time Heart - normal rate, regular rhythm, normal S1, S2, no murmurs, rubs, clicks or gallops Chest - clear to auscultation, no wheezes, rales or rhonchi, symmetric air entry, no tachypnea, retractions or cyanosis Abdomen - soft, nontender, nondistended, no masses or organomegaly Extremities - peripheral pulses normal, no pedal edema, no clubbing or cyanosis        Assessment & Plan:

## 2011-09-01 NOTE — Assessment & Plan Note (Signed)
Patient with 3 episodes of abdominal pain since September that are suspicious for biliary colic. Her last episode in November she did not get her ultrasound because the pain went away. This episode of pain caused her to go to the emergency department. She did get an ultrasound which showed cholelithiasis. Because this has been ongoing and causing her to go to the emergency department we will send her to surgery for evaluation.

## 2011-09-01 NOTE — Assessment & Plan Note (Signed)
BP Readings from Last 3 Encounters:  09/01/11 150/84  08/29/11 157/68  05/12/11 152/83   Blood pressures continued to be up. Patient to return in one week for recheck. She thinks her blood pressure is up because she's been sick. She has also been taking naproxen twice a day. I have asked her to stop this and we will recheck it without any NSAIDs. Could increase her HCTZ to 25 or her metoprolol.

## 2011-09-01 NOTE — Assessment & Plan Note (Signed)
Patient's cholesterol is up at last check. We'll check again today since it has been 5 months. Start Pravachol today.

## 2011-09-02 ENCOUNTER — Telehealth: Payer: Self-pay | Admitting: Family Medicine

## 2011-09-02 NOTE — Telephone Encounter (Signed)
Patient is calling because she has been referred to a surgeon and when they call her, she wants them to call her cell number.

## 2011-09-02 NOTE — Telephone Encounter (Signed)
Patient cell number added to [facility] form, they will contact patient when/if appointment is available.

## 2011-09-24 ENCOUNTER — Encounter (INDEPENDENT_AMBULATORY_CARE_PROVIDER_SITE_OTHER): Payer: Self-pay | Admitting: Surgery

## 2011-09-24 ENCOUNTER — Ambulatory Visit (INDEPENDENT_AMBULATORY_CARE_PROVIDER_SITE_OTHER): Payer: PRIVATE HEALTH INSURANCE | Admitting: Surgery

## 2011-09-24 VITALS — BP 140/80 | HR 70 | Temp 97.2°F | Resp 18 | Ht 62.0 in | Wt 158.5 lb

## 2011-09-24 DIAGNOSIS — K8050 Calculus of bile duct without cholangitis or cholecystitis without obstruction: Secondary | ICD-10-CM

## 2011-09-24 DIAGNOSIS — K8020 Calculus of gallbladder without cholecystitis without obstruction: Secondary | ICD-10-CM

## 2011-09-24 NOTE — Progress Notes (Signed)
Subjective:     Patient ID: Deborah Simon, female   DOB: 04/19/1951, 61 y.o.   MRN: [id number]  HPI  Deborah Simon  01/29/1951 [id number]  Patient Care Team: Rachel L Spiegel, MD as PCP - General (Family Medicine)  This patient is a 61 y.o.female who presents today for surgical evaluation at the request of Dr. Spiegel.   Reason for visit: Abdominal pain with gallstones. Probable biliary colic.  Patient is a pleasant overweight female. She's had 3 episodes of abdominal pain. Last one was after eating a chili don't. She notes the pain is periumbilical and intense. Cramping. Some nausea. No emesis. The first 2 attacks, she was given a trial of antacids. She cannot recall what it was. She began to have headaches with it and stopped. She does have a history of heartburn. This is different from that. Heartburn usually is controlled with the help of diet. She does have diabetes blood is well-controlled on an oral hypoglycemic.  No sick contacts or travel history. No bleeding. The pain does not radiate to her back or shoulder 2 months. She can walk a few miles without difficulty. No cardiac problems. She does not smoke.  She had workup which ultrasound showed gallstones. She went emergency room that ruled out other concerns. She was sent to me over concerns of gallbladder stones being the etiology of her pain.  Patient Active Problem List  Diagnoses  . HYPERLIPIDEMIA  . OBESITY  . HYPERTENSION  . ALLERGIC RHINITIS  . BACK PAIN, LUMBAR, CHRONIC  . COUGH  . DIABETES MELLITUS, TYPE II  . Biliary colic    Past Medical History  Diagnosis Date  . Hypertension   . Diabetes mellitus   . Hyperlipidemia     History reviewed. No pertinent past surgical history.  History   Social History  . Marital Status: Single    Spouse Name: N/A    Number of Children: N/A  . Years of Education: N/A   Occupational History  . Not on file.   Social History Main Topics  . Smoking status: Passive  Smoker  . Smokeless tobacco: Never Used  . Alcohol Use: No  . Drug Use: No  . Sexually Active: Not on file   Other Topics Concern  . Not on file   Social History Narrative  . No narrative on file    Family History  Problem Relation Age of Onset  . Stroke Mother   . Stroke Father     Current Outpatient Prescriptions  Medication Sig Dispense Refill  . cyanocobalamin 500 MCG tablet Take 500 mcg by mouth daily.      . fluticasone (FLONASE) 50 MCG/ACT nasal spray Place 1 spray into the nose daily.  16 g  11  . Garlic Oil 1000 MG CAPS Take 1,000 mg by mouth daily.      . irbesartan-hydrochlorothiazide (AVALIDE) 300-12.5 MG per tablet Take 1 tablet by mouth daily.  30 tablet  11  . metFORMIN (GLUCOPHAGE) 500 MG tablet Take 1 tablet (500 mg total) by mouth 2 (two) times daily with a meal.  60 tablet  11  . metoprolol succinate (TOPROL-XL) 25 MG 24 hr tablet Take 1 tablet (25 mg total) by mouth daily.  30 tablet  11  . Omega-3 Fatty Acids (FISH OIL) 1000 MG CAPS Take 1,000 mg by mouth daily.      . POTASSIUM GLUCONATE PO Take 1 tablet by mouth every morning.      . pravastatin (PRAVACHOL) 40 MG tablet   Take 1 tablet (40 mg total) by mouth daily.  90 tablet  3  . DISCONTD: olmesartan-hydrochlorothiazide (BENICAR HCT) 40-12.5 MG per tablet Take 1 tablet by mouth daily.  30 tablet  11     Allergies  Allergen Reactions  . Penicillins     REACTION: unspecified    BP 140/80  Pulse 70  Temp(Src) 97.2 F (36.2 C) (Temporal)  Resp 18  Ht 5' 2" (1.575 m)  Wt 158 lb 8 oz (71.895 kg)  BMI 28.99 kg/m2     Review of Systems  Constitutional: Negative for fever, chills, diaphoresis, appetite change and fatigue.  HENT: Negative for ear pain, sore throat, trouble swallowing, neck pain and ear discharge.   Eyes: Negative for photophobia, discharge and visual disturbance.  Respiratory: Negative for cough, choking, chest tightness and shortness of breath.   Cardiovascular: Negative for chest  pain and palpitations.       Patient walks 60 minutes for about 2 miles without difficulty.  No exertional chest/neck/shoulder/arm pain.   Gastrointestinal: Positive for nausea and abdominal distention. Negative for vomiting, abdominal pain, diarrhea, constipation, blood in stool, anal bleeding and rectal pain.       No personal nor family history of GI/colon cancer, inflammatory bowel disease, irritable bowel syndrome, allergy such as Celiac Sprue, dietary/dairy problems, colitis, ulcers nor gastritis.    No recent sick contacts/gastroenteritis.  No travel outside the country.  No changes in diet.  Heartburn controlled    Genitourinary: Negative for dysuria, frequency and difficulty urinating.  Musculoskeletal: Negative for myalgias and gait problem.  Skin: Negative for color change, pallor and rash.  Neurological: Negative for dizziness, speech difficulty, weakness and numbness.  Hematological: Negative for adenopathy.  Psychiatric/Behavioral: Negative for confusion and agitation. The patient is not nervous/anxious.        Objective:   Physical Exam  Constitutional: She is oriented to person, place, and time. She appears well-developed and well-nourished. No distress.  HENT:  Head: Normocephalic.  Mouth/Throat: Oropharynx is clear and moist. No oropharyngeal exudate.  Eyes: Conjunctivae and EOM are normal. Pupils are equal, round, and reactive to light. No scleral icterus.  Neck: Normal range of motion. Neck supple. No tracheal deviation present.  Cardiovascular: Normal rate, regular rhythm and intact distal pulses.   Pulmonary/Chest: Effort normal and breath sounds normal. No respiratory distress. She exhibits no tenderness.  Abdominal: Soft. She exhibits no distension and no mass. There is no tenderness. There is no rebound and no guarding. Hernia confirmed negative in the right inguinal area and confirmed negative in the left inguinal area.       Obese.    Genitourinary: No  vaginal discharge found.  Musculoskeletal: Normal range of motion. She exhibits no tenderness.  Lymphadenopathy:    She has no cervical adenopathy.       Right: No inguinal adenopathy present.       Left: No inguinal adenopathy present.  Neurological: She is alert and oriented to person, place, and time. No cranial nerve deficit. She exhibits normal muscle tone. Coordination normal.  Skin: Skin is warm and dry. No rash noted. She is not diaphoretic. No erythema.  Psychiatric: She has a normal mood and affect. Her behavior is normal. Judgment and thought content normal.       Assessment:     Postprandial abdominal pain with known gallstones. Heartburn controlled. Differential diagnosis otherwise not likely. Probable biliary colic    Plan:     Well she doesn't have classic radiation to her   back, the rest of her differential diagnosis seems unlikely. I suspect gallbladder etiology especially with the stronger attack. I think she is a reasonable candidate to try with single site although she has most of her obesity central. She is interested in proceeding with surgery. I did discuss the procedure with her:  The anatomy & physiology of hepatobiliary & pancreatic function was discussed.  The pathophysiology of gallbladder dysfunction was discussed.  Natural history risks without surgery was discussed.   I feel the risks of no intervention will lead to serious problems that outweigh the operative risks; therefore, I recommended cholecystectomy to remove the pathology.  I explained laparoscopic techniques with possible need for an open approach.  Probable cholangiogram to evaluate the bilary tract was explained as well.    Risks such as bleeding, infection, abscess, leak, injury to other organs, need for further treatment, heart attack, death, and other risks were discussed.  I noted a good likelihood this will help address the problem.  Possibility that this will not correct all abdominal symptoms was  explained.  Goals of post-operative recovery were discussed as well.  We will work to minimize complications.  An educational handout further explaining the pathology and treatment options was given as well.  Questions were answered.  The patient expresses understanding & wishes to proceed with surgery.  Consider a trial of H2 blockers or different PPI than last time to see if this will control her heartburn better. I will defer to her primary care physician      

## 2011-09-25 ENCOUNTER — Encounter (HOSPITAL_COMMUNITY): Payer: Self-pay | Admitting: Pharmacy Technician

## 2011-10-01 NOTE — Pre-Procedure Instructions (Signed)
20 Deborah Simon  10/01/2011   Your procedure is scheduled on:  April 23  Report to [facility] at 08:00 AM.  Call this number if you have problems the morning of surgery: [phone]   Remember:   Do not eat food:After Midnight.  May have clear liquids: up to 4 Hours before arrival. 04:00  Clear liquids include soda, tea, black coffee, apple or grape juice, broth.  Take these medicines the morning of surgery with A SIP OF WATER: Fluticasone, Metoprolol   STOP Aspirin, Cyanocobalamin (Vitamin B12), Garlic Oil, Fish Oil today 10/02/11  Do not wear jewelry, make-up or nail polish.  Do not wear lotions, powders, or perfumes. You may wear deodorant.  Do not shave 48 hours prior to surgery.  Do not bring valuables to the hospital.  Contacts, dentures or bridgework may not be worn into surgery.  Leave suitcase in the car. After surgery it may be brought to your room.  For patients admitted to the hospital, checkout time is 11:00 AM the day of discharge.   Patients discharged the day of surgery will not be allowed to drive home.  Name and phone number of your driver: andrew herbin  Special Instructions: CHG Shower Use Special Wash: 1/2 bottle night before surgery and 1/2 bottle morning of surgery.   Please read over the following fact sheets that you were given: Pain Booklet, Coughing and Deep Breathing and Surgical Site Infection Prevention

## 2011-10-02 ENCOUNTER — Encounter (HOSPITAL_COMMUNITY)
Admission: RE | Admit: 2011-10-02 | Discharge: 2011-10-02 | Disposition: A | Discharge status: Home / Self Care | Payer: Self-pay | Source: Ambulatory Visit | Attending: Anesthesiology | Admitting: Anesthesiology

## 2011-10-02 ENCOUNTER — Encounter (HOSPITAL_COMMUNITY)
Admission: RE | Admit: 2011-10-02 | Discharge: 2011-10-02 | Disposition: A | Discharge status: Home / Self Care | Payer: Self-pay | Source: Ambulatory Visit | Attending: Surgery | Admitting: Surgery

## 2011-10-02 LAB — CBC
HCT: 35.9 % — ABNORMAL LOW (ref 36.0–46.0)
MCH: 31.4 pg (ref 26.0–34.0)
MCV: 91.6 fL (ref 78.0–100.0)
RDW: 13.4 % (ref 11.5–15.5)
WBC: 8 10*3/uL (ref 4.0–10.5)

## 2011-10-02 LAB — BASIC METABOLIC PANEL
BUN: 11 mg/dL (ref 6–23)
CO2: 29 mEq/L (ref 19–32)
Chloride: 103 mEq/L (ref 96–112)
Creatinine, Ser: 0.72 mg/dL (ref 0.50–1.10)
Glucose, Bld: 119 mg/dL — ABNORMAL HIGH (ref 70–99)

## 2011-10-02 MED ORDER — CHLORHEXIDINE GLUCONATE 4 % EX LIQD: 1.0000 "application " | Freq: Once | CUTANEOUS | Status: DC

## 2011-10-07 ENCOUNTER — Ambulatory Visit (HOSPITAL_COMMUNITY)
Admission: RE | Admit: 2011-10-07 | Discharge: 2011-10-07 | Disposition: A | Discharge status: Home / Self Care | Payer: Self-pay | Source: Ambulatory Visit | Attending: Surgery | Admitting: Surgery

## 2011-10-07 ENCOUNTER — Encounter (HOSPITAL_COMMUNITY): Payer: Self-pay | Admitting: Anesthesiology

## 2011-10-07 ENCOUNTER — Encounter (HOSPITAL_COMMUNITY)
Admission: RE | Disposition: A | Discharge status: Home / Self Care | Payer: Self-pay | Source: Ambulatory Visit | Attending: Surgery

## 2011-10-07 ENCOUNTER — Ambulatory Visit (HOSPITAL_COMMUNITY): Payer: Self-pay | Admitting: Anesthesiology

## 2011-10-07 ENCOUNTER — Encounter (HOSPITAL_COMMUNITY): Payer: Self-pay | Admitting: *Deleted

## 2011-10-07 ENCOUNTER — Ambulatory Visit (HOSPITAL_COMMUNITY): Payer: Self-pay

## 2011-10-07 DIAGNOSIS — K8010 Calculus of gallbladder with chronic cholecystitis without obstruction: Secondary | ICD-10-CM

## 2011-10-07 DIAGNOSIS — E785 Hyperlipidemia, unspecified: Secondary | ICD-10-CM | POA: Insufficient documentation

## 2011-10-07 DIAGNOSIS — Z01818 Encounter for other preprocedural examination: Secondary | ICD-10-CM | POA: Insufficient documentation

## 2011-10-07 DIAGNOSIS — E669 Obesity, unspecified: Secondary | ICD-10-CM | POA: Insufficient documentation

## 2011-10-07 DIAGNOSIS — E119 Type 2 diabetes mellitus without complications: Secondary | ICD-10-CM | POA: Insufficient documentation

## 2011-10-07 DIAGNOSIS — K8050 Calculus of bile duct without cholangitis or cholecystitis without obstruction: Secondary | ICD-10-CM

## 2011-10-07 DIAGNOSIS — I10 Essential (primary) hypertension: Secondary | ICD-10-CM | POA: Insufficient documentation

## 2011-10-07 DIAGNOSIS — Z01810 Encounter for preprocedural cardiovascular examination: Secondary | ICD-10-CM | POA: Insufficient documentation

## 2011-10-07 HISTORY — PX: CHOLECYSTECTOMY: SHX55

## 2011-10-07 LAB — GLUCOSE, CAPILLARY
Glucose-Capillary: 90 mg/dL (ref 70–99)
Glucose-Capillary: 144 mg/dL — ABNORMAL HIGH (ref 70–99)

## 2011-10-07 SURGERY — LAPAROSCOPIC CHOLECYSTECTOMY SINGLE SITE
Anesthesia: General | Site: Abdomen | Wound class: Clean Contaminated

## 2011-10-07 MED ORDER — ONDANSETRON HCL 4 MG/2ML IJ SOLN: 4.0000 mg | Freq: Four times a day (QID) | INTRAMUSCULAR | Status: DC | PRN

## 2011-10-07 MED ORDER — SODIUM CHLORIDE 0.9 % IJ SOLN: 3.0000 mL | INTRAMUSCULAR | Status: DC | PRN

## 2011-10-07 MED ORDER — ACETAMINOPHEN 325 MG PO TABS: 650.0000 mg | ORAL_TABLET | ORAL | Status: DC | PRN

## 2011-10-07 MED ORDER — ACETAMINOPHEN 650 MG RE SUPP: 650.0000 mg | RECTAL | Status: DC | PRN

## 2011-10-07 MED ORDER — METOCLOPRAMIDE HCL 5 MG/ML IJ SOLN
10.0000 mg | Freq: Once | INTRAMUSCULAR | Status: DC | PRN
  Filled 2011-10-07: qty 2

## 2011-10-07 MED ORDER — MORPHINE SULFATE 2 MG/ML IJ SOLN: 0.0500 mg/kg | INTRAMUSCULAR | Status: DC | PRN

## 2011-10-07 MED ORDER — HYDROMORPHONE HCL PF 1 MG/ML IJ SOLN
0.2500 mg | INTRAMUSCULAR | Status: DC | PRN
  Administered 2011-10-07 (×2): 0.5 mg via INTRAVENOUS

## 2011-10-07 MED ORDER — OXYCODONE HCL 5 MG PO TABS: 5.0000 mg | ORAL_TABLET | ORAL | Status: DC | PRN

## 2011-10-07 MED ORDER — DROPERIDOL 2.5 MG/ML IJ SOLN
INTRAMUSCULAR | Status: DC | PRN
  Administered 2011-10-07: 0.625 mg via INTRAVENOUS

## 2011-10-07 MED ORDER — LACTATED RINGERS IV SOLN
INTRAVENOUS | Status: DC | PRN
  Administered 2011-10-07 (×2): via INTRAVENOUS

## 2011-10-07 MED ORDER — OXYCODONE HCL 5 MG PO TABS: 5.0000 mg | ORAL_TABLET | ORAL | Status: AC | PRN

## 2011-10-07 MED ORDER — PROPOFOL 10 MG/ML IV EMUL
INTRAVENOUS | Status: DC | PRN
  Administered 2011-10-07: 200 mg via INTRAVENOUS

## 2011-10-07 MED ORDER — GLYCOPYRROLATE 0.2 MG/ML IJ SOLN
INTRAMUSCULAR | Status: DC | PRN
  Administered 2011-10-07: 0.6 mg via INTRAVENOUS

## 2011-10-07 MED ORDER — 0.9 % SODIUM CHLORIDE (POUR BTL) OPTIME
TOPICAL | Status: DC | PRN
  Administered 2011-10-07: 1000 mL

## 2011-10-07 MED ORDER — IOHEXOL 300 MG/ML  SOLN
INTRAMUSCULAR | Status: DC | PRN
  Administered 2011-10-07: 1 mL via INTRAVENOUS

## 2011-10-07 MED ORDER — FENTANYL CITRATE 0.05 MG/ML IJ SOLN: 25.0000 ug | INTRAMUSCULAR | Status: DC | PRN

## 2011-10-07 MED ORDER — ROCURONIUM BROMIDE 100 MG/10ML IV SOLN
INTRAVENOUS | Status: DC | PRN
  Administered 2011-10-07: 40 mg via INTRAVENOUS

## 2011-10-07 MED ORDER — SODIUM CHLORIDE 0.9 % IR SOLN
Status: DC | PRN
  Administered 2011-10-07: 1000 mL

## 2011-10-07 MED ORDER — NEOSTIGMINE METHYLSULFATE 1 MG/ML IJ SOLN
INTRAMUSCULAR | Status: DC | PRN
  Administered 2011-10-07: 4 mg via INTRAVENOUS

## 2011-10-07 MED ORDER — DEXAMETHASONE SODIUM PHOSPHATE 4 MG/ML IJ SOLN
INTRAMUSCULAR | Status: DC | PRN
  Administered 2011-10-07: 10 mg via INTRAVENOUS

## 2011-10-07 MED ORDER — LIDOCAINE HCL (CARDIAC) 20 MG/ML IV SOLN
INTRAVENOUS | Status: DC | PRN
  Administered 2011-10-07: 100 mg via INTRAVENOUS

## 2011-10-07 MED ORDER — SODIUM CHLORIDE 0.9 % IV SOLN: 250.0000 mL | INTRAVENOUS | Status: DC | PRN

## 2011-10-07 MED ORDER — FENTANYL CITRATE 0.05 MG/ML IJ SOLN
INTRAMUSCULAR | Status: DC | PRN
  Administered 2011-10-07: 50 ug via INTRAVENOUS
  Administered 2011-10-07: 100 ug via INTRAVENOUS

## 2011-10-07 MED ORDER — SODIUM CHLORIDE 0.9 % IJ SOLN: 3.0000 mL | Freq: Two times a day (BID) | INTRAMUSCULAR | Status: DC

## 2011-10-07 MED ORDER — ONDANSETRON HCL 4 MG/2ML IJ SOLN
INTRAMUSCULAR | Status: DC | PRN
  Administered 2011-10-07: 4 mg via INTRAVENOUS

## 2011-10-07 MED ORDER — MIDAZOLAM HCL 5 MG/5ML IJ SOLN
INTRAMUSCULAR | Status: DC | PRN
  Administered 2011-10-07: 1 mg via INTRAVENOUS

## 2011-10-07 MED ORDER — LACTATED RINGERS IV SOLN
INTRAVENOUS | Status: DC
  Administered 2011-10-07: 10:00:00 via INTRAVENOUS

## 2011-10-07 MED ORDER — BUPIVACAINE-EPINEPHRINE 0.25% -1:200000 IJ SOLN
INTRAMUSCULAR | Status: DC | PRN
  Administered 2011-10-07: 30 mL

## 2011-10-07 SURGICAL SUPPLY — 50 items
APPLIER CLIP 5 13 M/L LIGAMAX5 (MISCELLANEOUS) ×2
BLADE SURG ROTATE 9660 (MISCELLANEOUS) IMPLANT
CANISTER SUCTION 2500CC (MISCELLANEOUS) ×2 IMPLANT
CHLORAPREP W/TINT 26ML (MISCELLANEOUS) ×2 IMPLANT
CLIP APPLIE 5 13 M/L LIGAMAX5 (MISCELLANEOUS) ×1 IMPLANT
CLOTH BEACON ORANGE TIMEOUT ST (SAFETY) ×2 IMPLANT
COVER MAYO STAND STRL (DRAPES) ×2 IMPLANT
COVER SURGICAL LIGHT HANDLE (MISCELLANEOUS) ×2 IMPLANT
DECANTER SPIKE VIAL GLASS SM (MISCELLANEOUS) IMPLANT
DRAPE C-ARM 42X72 X-RAY (DRAPES) ×2 IMPLANT
DRAPE WARM FLUID 44X44 (DRAPE) ×2 IMPLANT
DRSG TEGADERM 4X4.75 (GAUZE/BANDAGES/DRESSINGS) ×2 IMPLANT
ELECT REM PT RETURN 9FT ADLT (ELECTROSURGICAL) ×2
ELECTRODE REM PT RTRN 9FT ADLT (ELECTROSURGICAL) ×1 IMPLANT
ENDOLOOP SUT PDS II  0 18 (SUTURE)
ENDOLOOP SUT PDS II 0 18 (SUTURE) IMPLANT
GAUZE SPONGE 2X2 8PLY STRL LF (GAUZE/BANDAGES/DRESSINGS) ×1 IMPLANT
GLOVE BIOGEL PI IND STRL 6.5 (GLOVE) ×1 IMPLANT
GLOVE BIOGEL PI IND STRL 7.0 (GLOVE) ×2 IMPLANT
GLOVE BIOGEL PI IND STRL 8 (GLOVE) ×1 IMPLANT
GLOVE BIOGEL PI INDICATOR 6.5 (GLOVE) ×1
GLOVE BIOGEL PI INDICATOR 7.0 (GLOVE) ×2
GLOVE BIOGEL PI INDICATOR 8 (GLOVE) ×1
GLOVE ECLIPSE 6.5 STRL STRAW (GLOVE) ×4 IMPLANT
GLOVE ECLIPSE 8.0 STRL XLNG CF (GLOVE) ×2 IMPLANT
GLOVE SURG SS PI 6.5 STRL IVOR (GLOVE) ×4 IMPLANT
GOWN PREVENTION PLUS XLARGE (GOWN DISPOSABLE) ×2 IMPLANT
GOWN STRL NON-REIN LRG LVL3 (GOWN DISPOSABLE) ×6 IMPLANT
KIT BASIN OR (CUSTOM PROCEDURE TRAY) ×2 IMPLANT
KIT ROOM TURNOVER OR (KITS) ×2 IMPLANT
NEEDLE 22X1 1/2 (OR ONLY) (NEEDLE) ×2 IMPLANT
NS IRRIG 1000ML POUR BTL (IV SOLUTION) ×2 IMPLANT
PAD ARMBOARD 7.5X6 YLW CONV (MISCELLANEOUS) ×4 IMPLANT
POUCH SPECIMEN RETRIEVAL 10MM (ENDOMECHANICALS) IMPLANT
SCALPEL HARMONIC ACE (MISCELLANEOUS) ×2 IMPLANT
SCISSORS LAP 5X35 DISP (ENDOMECHANICALS) IMPLANT
SET CHOLANGIOGRAPH 5 50 .035 (SET/KITS/TRAYS/PACK) ×2 IMPLANT
SET IRRIG TUBING LAPAROSCOPIC (IRRIGATION / IRRIGATOR) ×2 IMPLANT
SPECIMEN JAR SMALL (MISCELLANEOUS) ×2 IMPLANT
SPONGE GAUZE 2X2 STER 10/PKG (GAUZE/BANDAGES/DRESSINGS) ×1
SUT MNCRL AB 4-0 PS2 18 (SUTURE) ×2 IMPLANT
SUT VICRYL 0 TIES 12 18 (SUTURE) IMPLANT
SUT VICRYL 0 UR6 27IN ABS (SUTURE) ×2 IMPLANT
TOWEL OR 17X26 10 PK STRL BLUE (TOWEL DISPOSABLE) ×2 IMPLANT
TRAY LAPAROSCOPIC (CUSTOM PROCEDURE TRAY) ×2 IMPLANT
TROCAR 5M 150ML BLDLS (TROCAR) ×2 IMPLANT
TROCAR FALLER TUNNELING (TROCAR) IMPLANT
TROCAR XCEL NON-BLD 5MMX100MML (ENDOMECHANICALS) ×2 IMPLANT
TROCAR Z-THREAD FIOS 5X100MM (TROCAR) ×2 IMPLANT
WATER STERILE IRR 1000ML POUR (IV SOLUTION) IMPLANT

## 2011-10-07 NOTE — H&P (View-Only) (Signed)
Subjective:     Patient ID: Deborah Simon, female   DOB: 26-Apr-1951, 61 y.o.   MRN: [id number]  HPI  Deborah Simon  05-28-1951 [id number]  Patient Care Team: Reginold Agent, MD as PCP - General (Family Medicine)  This patient is a 61 y.o.female who presents today for surgical evaluation at the request of Dr. Hulen Luster.   Reason for visit: Abdominal pain with gallstones. Probable biliary colic.  Patient is a pleasant overweight female. She's had 3 episodes of abdominal pain. Last one was after eating a chili don't. She notes the pain is periumbilical and intense. Cramping. Some nausea. No emesis. The first 2 attacks, she was given a trial of antacids. She cannot recall what it was. She began to have headaches with it and stopped. She does have a history of heartburn. This is different from that. Heartburn usually is controlled with the help of diet. She does have diabetes blood is well-controlled on an oral hypoglycemic.  No sick contacts or travel history. No bleeding. The pain does not radiate to her back or shoulder 2 months. She can walk a few miles without difficulty. No cardiac problems. She does not smoke.  She had workup which ultrasound showed gallstones. She went emergency room that ruled out other concerns. She was sent to me over concerns of gallbladder stones being the etiology of her pain.  Patient Active Problem List  Diagnoses  . HYPERLIPIDEMIA  . OBESITY  . HYPERTENSION  . ALLERGIC RHINITIS  . BACK PAIN, LUMBAR, CHRONIC  . COUGH  . DIABETES MELLITUS, TYPE II  . Biliary colic    Past Medical History  Diagnosis Date  . Hypertension   . Diabetes mellitus   . Hyperlipidemia     History reviewed. No pertinent past surgical history.  History   Social History  . Marital Status: Single    Spouse Name: N/A    Number of Children: N/A  . Years of Education: N/A   Occupational History  . Not on file.   Social History Main Topics  . Smoking status: Passive  Smoker  . Smokeless tobacco: Never Used  . Alcohol Use: No  . Drug Use: No  . Sexually Active: Not on file   Other Topics Concern  . Not on file   Social History Narrative  . No narrative on file    Family History  Problem Relation Age of Onset  . Stroke Mother   . Stroke Father     Current Outpatient Prescriptions  Medication Sig Dispense Refill  . cyanocobalamin 500 MCG tablet Take 500 mcg by mouth daily.      . fluticasone (FLONASE) 50 MCG/ACT nasal spray Place 1 spray into the nose daily.  16 g  11  . Garlic Oil 1000 MG CAPS Take 1,000 mg by mouth daily.      . irbesartan-hydrochlorothiazide (AVALIDE) 300-12.5 MG per tablet Take 1 tablet by mouth daily.  30 tablet  11  . metFORMIN (GLUCOPHAGE) 500 MG tablet Take 1 tablet (500 mg total) by mouth 2 (two) times daily with a meal.  60 tablet  11  . metoprolol succinate (TOPROL-XL) 25 MG 24 hr tablet Take 1 tablet (25 mg total) by mouth daily.  30 tablet  11  . Omega-3 Fatty Acids (FISH OIL) 1000 MG CAPS Take 1,000 mg by mouth daily.      Marland Kitchen POTASSIUM GLUCONATE PO Take 1 tablet by mouth every morning.      . pravastatin (PRAVACHOL) 40 MG tablet  Take 1 tablet (40 mg total) by mouth daily.  90 tablet  3  . DISCONTD: olmesartan-hydrochlorothiazide (BENICAR HCT) 40-12.5 MG per tablet Take 1 tablet by mouth daily.  30 tablet  11     Allergies  Allergen Reactions  . Penicillins     REACTION: unspecified    BP 140/80  Pulse 70  Temp(Src) 97.2 F (36.2 C) (Temporal)  Resp 18  Ht 5\' 2"  (1.575 m)  Wt 158 lb 8 oz (71.895 kg)  BMI 28.99 kg/m2     Review of Systems  Constitutional: Negative for fever, chills, diaphoresis, appetite change and fatigue.  HENT: Negative for ear pain, sore throat, trouble swallowing, neck pain and ear discharge.   Eyes: Negative for photophobia, discharge and visual disturbance.  Respiratory: Negative for cough, choking, chest tightness and shortness of breath.   Cardiovascular: Negative for chest  pain and palpitations.       Patient walks 60 minutes for about 2 miles without difficulty.  No exertional chest/neck/shoulder/arm pain.   Gastrointestinal: Positive for nausea and abdominal distention. Negative for vomiting, abdominal pain, diarrhea, constipation, blood in stool, anal bleeding and rectal pain.       No personal nor family history of GI/colon cancer, inflammatory bowel disease, irritable bowel syndrome, allergy such as Celiac Sprue, dietary/dairy problems, colitis, ulcers nor gastritis.    No recent sick contacts/gastroenteritis.  No travel outside the country.  No changes in diet.  Heartburn controlled    Genitourinary: Negative for dysuria, frequency and difficulty urinating.  Musculoskeletal: Negative for myalgias and gait problem.  Skin: Negative for color change, pallor and rash.  Neurological: Negative for dizziness, speech difficulty, weakness and numbness.  Hematological: Negative for adenopathy.  Psychiatric/Behavioral: Negative for confusion and agitation. The patient is not nervous/anxious.        Objective:   Physical Exam  Constitutional: She is oriented to person, place, and time. She appears well-developed and well-nourished. No distress.  HENT:  Head: Normocephalic.  Mouth/Throat: Oropharynx is clear and moist. No oropharyngeal exudate.  Eyes: Conjunctivae and EOM are normal. Pupils are equal, round, and reactive to light. No scleral icterus.  Neck: Normal range of motion. Neck supple. No tracheal deviation present.  Cardiovascular: Normal rate, regular rhythm and intact distal pulses.   Pulmonary/Chest: Effort normal and breath sounds normal. No respiratory distress. She exhibits no tenderness.  Abdominal: Soft. She exhibits no distension and no mass. There is no tenderness. There is no rebound and no guarding. Hernia confirmed negative in the right inguinal area and confirmed negative in the left inguinal area.       Obese.    Genitourinary: No  vaginal discharge found.  Musculoskeletal: Normal range of motion. She exhibits no tenderness.  Lymphadenopathy:    She has no cervical adenopathy.       Right: No inguinal adenopathy present.       Left: No inguinal adenopathy present.  Neurological: She is alert and oriented to person, place, and time. No cranial nerve deficit. She exhibits normal muscle tone. Coordination normal.  Skin: Skin is warm and dry. No rash noted. She is not diaphoretic. No erythema.  Psychiatric: She has a normal mood and affect. Her behavior is normal. Judgment and thought content normal.       Assessment:     Postprandial abdominal pain with known gallstones. Heartburn controlled. Differential diagnosis otherwise not likely. Probable biliary colic    Plan:     Well she doesn't have classic radiation to her  back, the rest of her differential diagnosis seems unlikely. I suspect gallbladder etiology especially with the stronger attack. I think she is a reasonable candidate to try with single site although she has most of her obesity central. She is interested in proceeding with surgery. I did discuss the procedure with her:  The anatomy & physiology of hepatobiliary & pancreatic function was discussed.  The pathophysiology of gallbladder dysfunction was discussed.  Natural history risks without surgery was discussed.   I feel the risks of no intervention will lead to serious problems that outweigh the operative risks; therefore, I recommended cholecystectomy to remove the pathology.  I explained laparoscopic techniques with possible need for an open approach.  Probable cholangiogram to evaluate the bilary tract was explained as well.    Risks such as bleeding, infection, abscess, leak, injury to other organs, need for further treatment, heart attack, death, and other risks were discussed.  I noted a good likelihood this will help address the problem.  Possibility that this will not correct all abdominal symptoms was  explained.  Goals of post-operative recovery were discussed as well.  We will work to minimize complications.  An educational handout further explaining the pathology and treatment options was given as well.  Questions were answered.  The patient expresses understanding & wishes to proceed with surgery.  Consider a trial of H2 blockers or different PPI than last time to see if this will control her heartburn better. I will defer to her primary care physician

## 2011-10-07 NOTE — Anesthesia Procedure Notes (Signed)
Procedure Name: Intubation Date/Time: 10/07/2011 10:45 AM Performed by: Darcey Nora B Pre-anesthesia Checklist: Patient identified, Emergency Drugs available, Suction available and Patient being monitored Patient Re-evaluated:Patient Re-evaluated prior to inductionOxygen Delivery Method: Circle system utilized Preoxygenation: Pre-oxygenation with 100% oxygen Intubation Type: IV induction Ventilation: Mask ventilation without difficulty Grade View: Grade I Tube type: Oral Tube size: 7.5 mm Number of attempts: 1 Airway Equipment and Method: Stylet Placement Confirmation: ETT inserted through vocal cords under direct vision,  breath sounds checked- equal and bilateral and positive ETCO2 Secured at: 21 (cm at upper gum) cm Tube secured with: Tape Dental Injury: Teeth and Oropharynx as per pre-operative assessment

## 2011-10-07 NOTE — Anesthesia Preprocedure Evaluation (Signed)
Anesthesia Evaluation  Patient identified by MRN, date of birth, ID band Patient awake    Reviewed: Allergy & Precautions, H&P , NPO status , Patient's Chart, lab work & pertinent test results  History of Anesthesia Complications Negative for: history of anesthetic complications  Airway Mallampati: II TM Distance: >3 FB Neck ROM: Full    Dental  (+) Partial Upper and Dental Advisory Given   Pulmonary neg pulmonary ROS,  breath sounds clear to auscultation  Pulmonary exam normal       Cardiovascular hypertension, Rhythm:Regular Rate:Normal     Neuro/Psych    GI/Hepatic Neg liver ROS,   Endo/Other  Diabetes mellitus-, Oral Hypoglycemic Agents  Renal/GU negative Renal ROS     Musculoskeletal   Abdominal   Peds  Hematology   Anesthesia Other Findings   Reproductive/Obstetrics                           Anesthesia Physical Anesthesia Plan  ASA: II  Anesthesia Plan: General   Post-op Pain Management:    Induction: Intravenous  Airway Management Planned: Oral ETT  Additional Equipment:   Intra-op Plan:   Post-operative Plan: Extubation in OR  Informed Consent: I have reviewed the patients History and Physical, chart, labs and discussed the procedure including the risks, benefits and alternatives for the proposed anesthesia with the patient or authorized representative who has indicated his/her understanding and acceptance.   Dental advisory given  Plan Discussed with: CRNA, Anesthesiologist and Surgeon  Anesthesia Plan Comments:         Anesthesia Quick Evaluation

## 2011-10-07 NOTE — Anesthesia Postprocedure Evaluation (Signed)
Anesthesia Post Note  Patient: Deborah Simon  Procedure(s) Performed: Procedure(s) (LRB): LAPAROSCOPIC CHOLECYSTECTOMY SINGLE PORT (N/A)  Anesthesia type: general  Patient location: PACU  Post pain: Pain level controlled  Post assessment: Patient's Cardiovascular Status Stable  Last Vitals:  Filed Vitals:   10/07/11 1300  BP: 165/71  Pulse: 61  Temp:   Resp: 11    Post vital signs: Reviewed and stable  Level of consciousness: sedated  Complications: No apparent anesthesia complications

## 2011-10-07 NOTE — Op Note (Signed)
10/07/2011  12:00 PM  PATIENT:  Hoover Brunette  61 y.o. female  Patient Care Team: Reginold Agent, MD as PCP - General (Family Medicine)  PRE-OPERATIVE DIAGNOSIS:  biliary colic, gallstones  POST-OPERATIVE DIAGNOSIS:  Chronic cholecystitis w gallstones  PROCEDURE:  Procedure(s): LAPAROSCOPIC CHOLECYSTECTOMY w IOC, SINGLE Site  SURGEON:  Surgeon(s): Ardeth Sportsman, MD  ASSISTANT: none   ANESTHESIA:   local and general  EBL:  Total I/O In: 1000 [I.V.:1000] Out: -   Delay start of Pharmacological VTE agent (>24hrs) due to surgical blood loss or risk of bleeding:  no  DRAINS: none   SPECIMEN:  Source of Specimen:  Gallbladder w GS  DISPOSITION OF SPECIMEN:  PATHOLOGY  COUNTS:  YES  PLAN OF CARE: Discharge to home after PACU  PATIENT DISPOSITION:  PACU - hemodynamically stable.  INDICATION: Patient is a pleasant 61 year old female. She had episodes of abdominal pain postprandial consistent with biliary colic. Workup showed gallstones. She has had repeated attacks. Rest of the differential diagnosis seems less likely. I recommend she consider cholecystectomy:  The anatomy & physiology of hepatobiliary & pancreatic function was discussed.  The pathophysiology of gallbladder dysfunction was discussed.  Natural history risks without surgery was discussed.   I feel the risks of no intervention will lead to serious problems that outweigh the operative risks; therefore, I recommended cholecystectomy to remove the pathology.  I explained laparoscopic techniques with possible need for an open approach.  Probable cholangiogram to evaluate the bilary tract was explained as well.    Risks such as bleeding, infection, abscess, leak, injury to other organs, need for further treatment, heart attack, death, and other risks were discussed.  I noted a good likelihood this will help address the problem.  Possibility that this will not correct all abdominal symptoms was explained.  Goals of  post-operative recovery were discussed as well.  We will work to minimize complications.  An educational handout further explaining the pathology and treatment options was given as well.  Questions were answered.  The patient expresses understanding & wishes to proceed with surgery.  OR FINDINGS: She had adhesions of her greater omentum and mesocolon to the gallbladder. She had gallbladder wall thickening and leathery changes. This is consistent with chronic cholecystitis.  She had medium -sized cuboid stones with a narrowed distal cystic duct. Her cholangiogram showed normal/classic anatomy.  DESCRIPTION:   The patient was identified & brought in the operating room. The patient was positioned supine with arms tucked. SCDs were active during the entire case. The patient underwent general anesthesia without any difficulty.  The abdomen was prepped and draped in a sterile fashion. A Surgical Timeout confirmed our plan.  I made a transverse curvilinear incision through the superior umbilical fold.  I placed a 5mm long port through the supraumbilical fascia using a modified Hassan cutdown technique. I began carbon dioxide insufflation. Camera inspection revealed no injury. There were no adhesions to the anterior abdominal wall supraumbilically.  I proceeded to continue with single site technique. I placed a #5 port in left upper aspect of the wound. I placed a 5 mm atraumatic grasper in the right inferior aspect of the wound.  I turned attention to the right upper quadrant.  Findings as noted above  The gallbladder fundus was elevated cephalad. I freed the greater omental, mesocolonic, and peritoneal coverings between the gallbladder and the liver on the posteriolateral and anteriomedial walls. I alternated between Harmonic & blunt [place] dissection to help get a good critical  view of the cystic artery and cystic duct. I did further dissection to free a few centimeters of the  gallbladder off the liver bed  to get a good critical view of the infundibulum and cystic duct. I mobilized the cystic artery; and, after getting a good 360 view, ligated the cystic artery using the Harmonic ultrasonic dissection. I skeletonized the cystic duct.  I placed a clip on the infundibulum. I did a partial cystic duct-otomy and ensured patency. I placed a 5 [place] cholangiocatheter through a puncture site at the right subcostal ridge of the abdominal wall and directed it into the cystic duct.  We ran a cholangiogram with dilute radio-opaque contrast and continuous fluoroscopy. Contrast flowed from a side branch consistent with cystic duct cannulization. Contrast flowed up the common hepatic duct into the right and left intrahepatic chains out to secondary radicals. Contrast flowed down the common bile duct easily across the normal ampulla into the duodenum.  The biliary system was on the narrow end of normal.  This was consistent with a normal cholangiogram.  I removed the cholangiocatheter. I placed clips on the cystic duct x4.  I completed cystic duct transection. I freed the gallbladder from its remaining attachments to the liver. I ensured hemostasis on the gallbladder fossa of the liver and elsewhere. I inspected the rest of the abdomen & detected no injury nor bleeding elsewhere.  I removed the gallbladder out the supraumbilical fascia. I closed the fascia transversely using 0 Vicryl interrupted stitches. A closed the skin using 4-0 monocryl stitch.  Sterile dressing was applied. The patient was extubated & arrived in the PACU in stable condition..  I had discussed postoperative care with the patient in the holding area. I am about to locate the patient's family and discuss operative findings and postoperative goals / instructions.  Instructions are written in the chart as well.

## 2011-10-07 NOTE — Discharge Instructions (Signed)

## 2011-10-07 NOTE — Interval H&P Note (Signed)
History and Physical Interval Note:  10/07/2011 10:04 AM  Deborah Simon  has presented today for surgery, with the diagnosis of bilinary cholic  The various methods of treatment have been discussed with the patient and family. After consideration of risks, benefits and other options for treatment, the patient has consented to  Procedure(s) (LRB): LAPAROSCOPIC CHOLECYSTECTOMY SINGLE PORT (N/A) as a surgical intervention .  The patients' history has been reviewed, patient examined, no change in status, stable for surgery.  I have reviewed the patients' chart and labs.  Questions were answered to the patient's satisfaction.     Deborah Karel C.

## 2011-10-07 NOTE — Preoperative (Signed)
Beta Blockers   Reason not to administer Beta Blockers:Metoprolol 0630 this morning

## 2011-10-07 NOTE — Transfer of Care (Signed)
Immediate Anesthesia Transfer of Care Note  Patient: Deborah Simon  Procedure(s) Performed: Procedure(s) (LRB): LAPAROSCOPIC CHOLECYSTECTOMY SINGLE PORT (N/A)  Patient Location: PACU  Anesthesia Type: General  Level of Consciousness: awake, alert , oriented and patient cooperative  Airway & Oxygen Therapy: Patient Spontanous Breathing and Patient connected to nasal cannula oxygen  Post-op Assessment: Report given to PACU RN, Post -op Vital signs reviewed and stable and Patient moving all extremities  Post vital signs: Reviewed and stable  Complications: No apparent anesthesia complications

## 2011-10-15 ENCOUNTER — Ambulatory Visit (INDEPENDENT_AMBULATORY_CARE_PROVIDER_SITE_OTHER): Payer: Self-pay | Admitting: Surgery

## 2011-10-15 ENCOUNTER — Encounter (INDEPENDENT_AMBULATORY_CARE_PROVIDER_SITE_OTHER): Payer: Self-pay | Admitting: Surgery

## 2011-10-15 VITALS — BP 124/70 | HR 80 | Resp 16 | Ht 62.0 in | Wt 156.2 lb

## 2011-10-15 DIAGNOSIS — K8010 Calculus of gallbladder with chronic cholecystitis without obstruction: Secondary | ICD-10-CM

## 2011-10-15 NOTE — Patient Instructions (Signed)

## 2011-10-15 NOTE — Progress Notes (Signed)
Subjective:     Patient ID: Deborah Simon, female   DOB: 01-15-1951, 61 y.o.   MRN: [id number]  HPI  Deborah Simon  [birth year]   [id number]  Patient Care Team: Reginold Agent, MD as PCP - General (Family Medicine)  This patient is a 61 y.o.female who presents today for surgical evaluation.   Procedure: Single site laparoscopic cholecystectomy with Intra-Op operative cholangiogram 10/07/2011  Pathology: Chronic cholecystitis and cholecystolithiasis  Patient comes with her husband. Feeling well. Using the oxycodone occasionally. Walking better. Having loose bowel movements, about 4. Eating low-fat mostly bland diet well. Appetite improving. Energy level improving. Still sore at times. Supposed to go back to full duty in 3 days but wonders if it is safe.  Patient Active Problem List  Diagnoses  . HYPERLIPIDEMIA  . OBESITY  . HYPERTENSION  . ALLERGIC RHINITIS  . BACK PAIN, LUMBAR, CHRONIC  . COUGH  . DIABETES MELLITUS, TYPE II  . Chronic cholecystitis with calculus    Past Medical History  Diagnosis Date  . Hypertension   . Diabetes mellitus   . Hyperlipidemia     Past Surgical History  Procedure Date  . Cholecystectomy 10/07/11    Single site    History   Social History  . Marital Status: Single    Spouse Name: N/A    Number of Children: N/A  . Years of Education: N/A   Occupational History  . Not on file.   Social History Main Topics  . Smoking status: Passive Smoker  . Smokeless tobacco: Never Used  . Alcohol Use: No  . Drug Use: No  . Sexually Active: Not on file   Other Topics Concern  . Not on file   Social History Narrative  . No narrative on file    Family History  Problem Relation Age of Onset  . Stroke Mother   . Stroke Father     Current Outpatient Prescriptions  Medication Sig Dispense Refill  . aspirin EC 81 MG tablet Take 81 mg by mouth daily.      . cyanocobalamin 500 MCG tablet Take 500 mcg by mouth daily.      . fluticasone  (FLONASE) 50 MCG/ACT nasal spray Place 1 spray into the nose daily.  16 g  11  . Garlic Oil 1000 MG CAPS Take 1,000 mg by mouth daily.      . irbesartan-hydrochlorothiazide (AVALIDE) 300-12.5 MG per tablet Take 1 tablet by mouth daily.  30 tablet  11  . metFORMIN (GLUCOPHAGE) 500 MG tablet Take 1 tablet (500 mg total) by mouth 2 (two) times daily with a meal.  60 tablet  11  . metoprolol succinate (TOPROL-XL) 25 MG 24 hr tablet Take 1 tablet (25 mg total) by mouth daily.  30 tablet  11  . Omega-3 Fatty Acids (FISH OIL) 1000 MG CAPS Take 1,000 mg by mouth daily.      Marland Kitchen oxyCODONE (OXY IR/ROXICODONE) 5 MG immediate release tablet Take 1-2 tablets (5-10 mg total) by mouth every 4 (four) hours as needed for pain.  40 tablet  0  . POTASSIUM GLUCONATE PO Take 1 tablet by mouth every morning.      . pravastatin (PRAVACHOL) 40 MG tablet Take 1 tablet (40 mg total) by mouth daily.  90 tablet  3  . DISCONTD: olmesartan-hydrochlorothiazide (BENICAR HCT) 40-12.5 MG per tablet Take 1 tablet by mouth daily.  30 tablet  11     Allergies  Allergen Reactions  . Penicillins     REACTION:  unspecified    BP 124/70  Pulse 80  Resp 16  Ht 5\' 2"  (1.575 m)  Wt 156 lb 3.2 oz (70.852 kg)  BMI 28.57 kg/m2     Review of Systems  Constitutional: Negative for fever, chills and diaphoresis.  HENT: Negative for ear pain, sore throat and trouble swallowing.   Eyes: Negative for photophobia and visual disturbance.  Respiratory: Negative for cough and choking.   Cardiovascular: Negative for chest pain and palpitations.  Gastrointestinal: Negative for nausea, vomiting, abdominal pain, constipation, anal bleeding and rectal pain.  Genitourinary: Negative for dysuria, frequency and difficulty urinating.  Musculoskeletal: Negative for myalgias and gait problem.  Skin: Negative for color change, pallor and rash.  Neurological: Negative for dizziness, speech difficulty, weakness and numbness.  Hematological: Negative  for adenopathy.  Psychiatric/Behavioral: Negative for confusion and agitation. The patient is not nervous/anxious.        Objective:   Physical Exam  Constitutional: She is oriented to person, place, and time. She appears well-developed and well-nourished. No distress.  HENT:  Head: Normocephalic.  Mouth/Throat: Oropharynx is clear and moist. No oropharyngeal exudate.  Eyes: Conjunctivae and EOM are normal. Pupils are equal, round, and reactive to light. No scleral icterus.  Neck: Normal range of motion. No tracheal deviation present.  Cardiovascular: Normal rate and intact distal pulses.   Pulmonary/Chest: Effort normal. No respiratory distress. She exhibits no tenderness.  Abdominal: Soft. She exhibits no distension. There is no tenderness. Hernia confirmed negative in the right inguinal area and confirmed negative in the left inguinal area.       Incisions clean with normal healing ridges.  No hernias.  Mild soreness supraumb  Genitourinary: No vaginal discharge found.  Musculoskeletal: Normal range of motion. She exhibits no tenderness.  Lymphadenopathy:       Right: No inguinal adenopathy present.       Left: No inguinal adenopathy present.  Neurological: She is alert and oriented to person, place, and time. No cranial nerve deficit. She exhibits normal muscle tone. Coordination normal.  Skin: Skin is warm and dry. No rash noted. She is not diaphoretic.  Psychiatric: She has a normal mood and affect. Her behavior is normal.       Assessment:     POD #8 lap chole, recovering well    Plan:     Increase activity as tolerated.  Do not push through pain.  Increase non-narcotics pain control  OK to RTwork next week  Advanced on diet as tolerated. Bowel regimen to avoid problems.  Return to clinic 3weeks, change to p.r.n if continues to improve. The patient expressed understanding and appreciation

## 2011-11-04 ENCOUNTER — Ambulatory Visit (INDEPENDENT_AMBULATORY_CARE_PROVIDER_SITE_OTHER): Payer: Self-pay | Admitting: Family Medicine

## 2011-11-04 ENCOUNTER — Ambulatory Visit (HOSPITAL_COMMUNITY)
Admission: RE | Admit: 2011-11-04 | Discharge: 2011-11-04 | Disposition: A | Discharge status: Home / Self Care | Payer: Self-pay | Source: Ambulatory Visit | Attending: Family Medicine | Admitting: Family Medicine

## 2011-11-04 DIAGNOSIS — R079 Chest pain, unspecified: Secondary | ICD-10-CM | POA: Insufficient documentation

## 2011-11-04 NOTE — Progress Notes (Signed)
  Subjective:    Patient ID: Deborah Simon, female    DOB: 18-Mar-1951, 61 y.o.   MRN: [id number]  HPI Acute visit for chest pain.  Patient works as a Holiday representative at [employer]. She started experiencing substernal burning chest pain around 0700. She presented today around 0900, and she reported improvement in the chest pain. It may have been due to her taking a Tums.  She ate breakfast around 0600 today. Ramen noodles.   She does not have a history of reflux.   She recently underwent a laparoscopic cholecystectomy on 04/23 for chronic cholecystitis with calculi. She did well post-operatively and is denies RUQ pain today.   Review of Systems Denies heart palpitations.  Denies nausea/vomiting. Denies SOB.  Denies radiation of chest pain.   Past Medical History, Family History, and Social History reviewed.  History of diabetes on metformin (HgbA1c 08/2011 6.4), hypertension, and hypercholesterolemia.  Her father and mother had heart problems. They passed away when they were in their 80s.  She denies any history of heart problems.     Objective:   Physical Exam Gen: NAD, overweight Psych: pleasant, engaged, appropriate, alert and oriented CV: RRR, no m/r/g Pulm: NI WOB, CTAB without w/r/r Abd: NABS, soft, NT, ND Chest: no TTP Ext: no edema Skin: warm, dry  ECG:  NSR.  Normal intervals. No ST changes or T-wave abnormalities.     Assessment & Plan:

## 2011-11-04 NOTE — Patient Instructions (Signed)
I think your chest pain is from reflux.   You may take the Tums as needed.  Avoid the following foods to see if that helps your symptoms.   Diet for GERD or PUD Nutrition therapy can help ease the discomfort of gastroesophageal reflux disease (GERD) and peptic ulcer disease (PUD).  HOME CARE INSTRUCTIONS   Eat your meals slowly, in a relaxed setting.   Eat 5 to 6 small meals per day.   If a food causes distress, stop eating it for a period of time.  FOODS TO AVOID  Coffee, regular or decaffeinated.   Cola beverages, regular or low calorie.   Tea, regular or decaffeinated.   Pepper.   Cocoa.   High fat foods, including meats.   Butter, margarine, hydrogenated oil (trans fats).   Peppermint or spearmint (if you have GERD).   Fruits and vegetables if not tolerated.   Alcohol.   Nicotine (smoking or chewing). This is one of the most potent stimulants to acid production in the gastrointestinal tract.   Any food that seems to aggravate your condition.  If you have questions regarding your diet, ask your caregiver or a registered dietitian. TIPS  Lying flat may make symptoms worse. Keep the head of your bed raised 6 to 9 inches (15 to 23 cm) by using a foam wedge or blocks under the legs of the bed.   Do not lay down until 3 hours after eating a meal.   Daily physical activity may help reduce symptoms.  MAKE SURE YOU:   Understand these instructions.   Will watch your condition.   Will get help right away if you are not doing well or get worse.  Document Released: 06/02/2005 Document Revised: 05/22/2011 Document Reviewed: 04/18/2011 [facility] Patient Information 2012 [place], [place].

## 2011-11-04 NOTE — Assessment & Plan Note (Addendum)
New problem. Burning epigastric/substernal chest pain. Started this morning after eating Ramen. Symptoms consistent with reflux. Improvement in symptoms after taking Tums.  She does not risk factors for heart disease (HTN, NIDDM, hypercholesterolemia) and family history of heart disease, however, I do not think chest pain is cardiac in origin. However, if chest pain persists, consider referral for exercise stress test.  Discussed GERD diet, Tums prn, given indications to return to clinic/go to the ED.

## 2011-11-05 ENCOUNTER — Encounter (INDEPENDENT_AMBULATORY_CARE_PROVIDER_SITE_OTHER): Payer: Self-pay | Admitting: Surgery

## 2011-11-13 ENCOUNTER — Ambulatory Visit: Payer: Self-pay | Admitting: Family Medicine

## 2011-11-13 ENCOUNTER — Encounter: Payer: Self-pay | Admitting: Family Medicine

## 2011-11-13 ENCOUNTER — Ambulatory Visit (INDEPENDENT_AMBULATORY_CARE_PROVIDER_SITE_OTHER): Payer: Self-pay | Admitting: Family Medicine

## 2011-11-13 ENCOUNTER — Telehealth: Payer: Self-pay | Admitting: Family Medicine

## 2011-11-13 VITALS — BP 156/83 | HR 87 | Temp 97.9°F | Ht 62.0 in | Wt 159.0 lb

## 2011-11-13 DIAGNOSIS — K219 Gastro-esophageal reflux disease without esophagitis: Secondary | ICD-10-CM | POA: Insufficient documentation

## 2011-11-13 MED ORDER — OMEPRAZOLE 20 MG PO CPDR: 20.0000 mg | DELAYED_RELEASE_CAPSULE | Freq: Every day | ORAL | Status: DC

## 2011-11-13 MED ORDER — RANITIDINE HCL 150 MG PO CAPS: 150.0000 mg | ORAL_CAPSULE | Freq: Two times a day (BID) | ORAL | Status: DC

## 2011-11-13 NOTE — Patient Instructions (Signed)
Very nice to meet you. I think her cough is from her reflux disease. I'm going to give you a medicine. Take one pill daily. Patient start to help in about 3 days. Keep taking her allergy medicine.  Diet for GERD or PUD Nutrition therapy can help ease the discomfort of gastroesophageal reflux disease (GERD) and peptic ulcer disease (PUD).  HOME CARE INSTRUCTIONS   Eat your meals slowly, in a relaxed setting.   Eat 5 to 6 small meals per day.   If a food causes distress, stop eating it for a period of time.  FOODS TO AVOID  Coffee, regular or decaffeinated.   Cola beverages, regular or low calorie.   Tea, regular or decaffeinated.   Pepper.   Cocoa.   High fat foods, including meats.   Butter, margarine, hydrogenated oil (trans fats).   Peppermint or spearmint (if you have GERD).   Fruits and vegetables if not tolerated.   Alcohol.   Nicotine (smoking or chewing). This is one of the most potent stimulants to acid production in the gastrointestinal tract.   Any food that seems to aggravate your condition.  If you have questions regarding your diet, ask your caregiver or a registered dietitian. TIPS  Lying flat may make symptoms worse. Keep the head of your bed raised 6 to 9 inches (15 to 23 cm) by using a foam wedge or blocks under the legs of the bed.   Do not lay down until 3 hours after eating a meal.   Daily physical activity may help reduce symptoms.  MAKE SURE YOU:   Understand these instructions.   Will watch your condition.   Will get help right away if you are not doing well or get worse.  Document Released: 06/02/2005 Document Revised: 05/22/2011 Document Reviewed: 04/18/2011 [facility] Patient Information 2012 [place], [place].

## 2011-11-13 NOTE — Progress Notes (Signed)
  Subjective:    Patient ID: Deborah Simon, female    DOB: 11-25-1950, 61 y.o.   MRN: [id number]  HPI Patient is here complaining of chronic cough. Patient has had this for very long amount of time has been seen a couple times for the same complaint. Patient states that it may be associated with some food. Patient states that it is nonproductive no associated fever or chills. Patient states that it seems to get worse at night. Patient does state she has a N. mouth in the morning. Patient denies any weight loss any hematemesis any diarrhea or constipation.   Review of Systems    as stated above Objective:   Physical Exam General: No apparent distress HEENT: Tympanic membranes normal bilaterally, pupils equal round reactive to light, posterior pharynx mild erythema no postnasal drip noted. Cardiovascular: Regular rate and rhythm no murmur Pulmonary: Clear to auscultation bilaterally. Abdominal exam bowel sounds positive nontender nondistended. Skin warm and dry no rash.       Assessment & Plan:

## 2011-11-13 NOTE — Telephone Encounter (Signed)
Patient is calling because the medication that Dr. Katrinka Blazing sent in to [facility] for her is way too expensive and is hoping for something that she can afford, like something on the $4 plan.

## 2011-11-13 NOTE — Assessment & Plan Note (Signed)
Patient's cough is consistent with reflux disease. Seems to be worse at night, maybe associated with food, nonproductive. Patient has no red flags at this time. We will do a low dose PPI. Patient also given diet to see if this visit seem to trigger it. Patient will followup with primary care provider in 2 weeks.

## 2011-11-13 NOTE — Telephone Encounter (Signed)
Called patient back switch medicines are ranitidine told her potential side effects and using to 2 times a day, patient appreciative.

## 2011-11-17 ENCOUNTER — Other Ambulatory Visit: Payer: Self-pay | Admitting: Family Medicine

## 2011-11-17 MED ORDER — METOPROLOL SUCCINATE ER 25 MG PO TB24: 25.0000 mg | ORAL_TABLET | Freq: Every day | ORAL | Status: DC

## 2011-11-21 ENCOUNTER — Encounter: Payer: Self-pay | Admitting: Family Medicine

## 2011-11-21 ENCOUNTER — Ambulatory Visit (INDEPENDENT_AMBULATORY_CARE_PROVIDER_SITE_OTHER): Payer: Self-pay | Admitting: Family Medicine

## 2011-11-21 VITALS — BP 137/78 | HR 92 | Temp 98.2°F | Ht 62.0 in | Wt 159.0 lb

## 2011-11-21 DIAGNOSIS — R05 Cough: Secondary | ICD-10-CM

## 2011-11-21 DIAGNOSIS — R059 Cough, unspecified: Secondary | ICD-10-CM

## 2011-11-21 MED ORDER — ALBUTEROL SULFATE HFA 108 (90 BASE) MCG/ACT IN AERS: 2.0000 | INHALATION_SPRAY | Freq: Four times a day (QID) | RESPIRATORY_TRACT | Status: DC | PRN

## 2011-11-21 MED ORDER — BENZONATATE 100 MG PO CAPS: 100.0000 mg | ORAL_CAPSULE | Freq: Four times a day (QID) | ORAL | Status: DC | PRN

## 2011-11-21 NOTE — Assessment & Plan Note (Signed)
Patient continues to have chronic dry cough despite ranitidine use. She is a nonsmoker, does not have any red flags, has a normal chest x-ray from April. She does not want to pursue chest CT or GI referral until we have tried other medicines. Today I have given her albuterol as well as Tessalon Perles. I would like her to see Dr. Raymondo Band for PFTs. She is to return in 2 weeks for followup with me.

## 2011-11-21 NOTE — Patient Instructions (Signed)
Please try the tessalon perles and the albuterol and see if they help your cough Please make an appt with Dr. Raymondo Band in our office for PFTs (lung function test) Continue the zantac Come back and see me in 2 weeks for recheck

## 2011-11-21 NOTE — Progress Notes (Signed)
  Subjective:    Patient ID: Deborah Simon, female    DOB: 05/24/1951, 61 y.o.   MRN: [id number]  HPI Patient is here for followup of cough. She is taking Zantac as prescribed twice a day but her cough is no better. It is to worse at night. It is still dry. She is not have a metallic taste or globus sensation. She denies fevers or weight loss. She is having hot flashes and therefore sometimes sweats at night.  Patient is a nonsmoker and has never been a smoker. She does not have sick contacts. She does not have any feeling of postnasal drip.  Review of Systems See above    Objective:   Physical Exam Vital signs reviewed General appearance - alert, well appearing, and in no distress Heart - normal rate, regular rhythm, normal S1, S2, no murmurs, rubs, clicks or gallops Chest - clear to auscultation, no wheezes, rales or rhonchi, symmetric air entry, no tachypnea, retractions or cyanosis Coughing in exam room Abdomen - soft, nontender, nondistended, no masses or organomegaly Extremities - peripheral pulses normal, no pedal edema, no clubbing or cyanosis        Assessment & Plan:

## 2011-12-03 ENCOUNTER — Ambulatory Visit: Payer: Self-pay | Admitting: Family Medicine

## 2012-01-28 ENCOUNTER — Encounter: Payer: Self-pay | Admitting: Family Medicine

## 2012-01-28 ENCOUNTER — Ambulatory Visit (INDEPENDENT_AMBULATORY_CARE_PROVIDER_SITE_OTHER): Payer: Self-pay | Admitting: Family Medicine

## 2012-01-28 VITALS — BP 130/79 | HR 80 | Temp 98.3°F | Ht 62.0 in | Wt 165.0 lb

## 2012-01-28 DIAGNOSIS — K644 Residual hemorrhoidal skin tags: Secondary | ICD-10-CM

## 2012-01-28 NOTE — Assessment & Plan Note (Signed)
A: external hemorrhoid w/o evidence of thrombosis.  P: 1. Continue metamucil, fiber and water to keep stools soft. Do not push or strain with bowel movements.  2. Call and come back if you develop swelling in the area especially with pain.

## 2012-01-28 NOTE — Progress Notes (Signed)
Patient ID: Deborah Simon, female   DOB: 07/05/1950, 61 y.o.   MRN: [id number] Subjective:    Deborah Simon is a 61 y.o. female here for evaluation persistent hemorrhoid x 2 weeks. She has a history of hemorrhoids which usually resolve in a week with sitz baths and preparation H.  Patient has associated symptoms of none. The patient denies rectal pain,  abdominal pain, constipation and blood in the stools. She feels that hemorrhoid is finally going down. She takes fiber tablets BID and consumes a high fiber diet.   Review of Systems Pertinent items are noted in HPI.    Objective:    BP 130/79  Pulse 80  Temp 98.3 F (36.8 C) (Oral)  Ht 5\' 2"  (1.575 m)  Wt 165 lb (74.844 kg)  BMI 30.18 kg/m2 General appearance: alert, cooperative and no distress Pelvic: external genitalia normal and positive findings: external hemorrhoid, reducible 1x 1 cm, 7 o'clock, nontender.     Assessment and Plan:

## 2012-01-28 NOTE — Patient Instructions (Addendum)
Deborah Simon,  Thank you for coming in to see me today.  You do have a hemorrhoid. The tissue looks healthy.   For this please do the following: 1. Continue metamucil, fiber and water to keep stools soft. Do not push or strain with bowel movements.  2. Call and come back if you develop swelling in the area especially with pain.    Dr. Armen Pickup   Hemorrhoids Hemorrhoids are veins in the rectum that get big. These veins can get blocked. Blocked veins become puffy (swollen) and painful. HOME CARE  Eat more fiber.   Drink enough fluid to keep your pee (urine) clear or pale yellow.   Exercise often.   Avoid straining to poop (bowel movement).   Keep the butt area dry and clean.   Only take medicine as told by your doctor.  If your hemorrhoids are puffy and painful:  Take a warm bath for 20 to 30 minutes. Do this 3 to 4 times a day.   Place ice packs on the area. Use the ice packs between the baths.   Put ice in a plastic bag.   Place a towel between your skin and the bag.   Leave the ice on for 15 to 20 minutes, 3 to 4 times a day.   Do not use a donut-shaped pillow. Do not sit on the toilet for a long time.   Go to the bathroom when your body has the urge to poop. This is so you do not strain as much to poop.  GET HELP RIGHT AWAY IF:   You have increasing pain that is not controlled with medicine.   You have uncontrolled bleeding.   You cannot poop.   You have pain or puffiness outside the area of the hemorrhoids.   You have chills.   You have a temperature by mouth above 102 F (38.9 C), not controlled by medicine.  MAKE SURE YOU:   Understand these instructions.   Will watch your condition.   Will get help right away if you are not doing well or get worse.  Document Released: 03/11/2008 Document Revised: 05/22/2011 Document Reviewed: 03/11/2008 [facility] Patient Information 2012 [place], [place].

## 2012-02-09 ENCOUNTER — Other Ambulatory Visit: Payer: Self-pay | Admitting: Family Medicine

## 2012-02-09 DIAGNOSIS — J309 Allergic rhinitis, unspecified: Secondary | ICD-10-CM

## 2012-02-09 MED ORDER — MOMETASONE FUROATE 50 MCG/ACT NA SUSP: 2.0000 | Freq: Every day | NASAL | Status: DC

## 2012-02-09 NOTE — Telephone Encounter (Signed)
Changed flonase to nasonex via fax.

## 2012-02-11 ENCOUNTER — Telehealth: Payer: Self-pay | Admitting: *Deleted

## 2012-02-11 ENCOUNTER — Other Ambulatory Visit: Payer: Self-pay | Admitting: Family Medicine

## 2012-02-11 DIAGNOSIS — J309 Allergic rhinitis, unspecified: Secondary | ICD-10-CM

## 2012-02-11 MED ORDER — MOMETASONE FUROATE 50 MCG/ACT NA SUSP: 2.0000 | Freq: Every day | NASAL | Status: DC

## 2012-02-11 NOTE — Telephone Encounter (Signed)
Dr. Karie Schwalbe attempted to print the script but unsuccessful.  [facility]

## 2012-02-11 NOTE — Telephone Encounter (Signed)
Discontinued flonase and prescribed nasonex, but clicked "fax" and this did not send to [facility].  Therefore, clicked print and office faxed that printed rx to [facility].

## 2012-04-01 ENCOUNTER — Ambulatory Visit (INDEPENDENT_AMBULATORY_CARE_PROVIDER_SITE_OTHER): Payer: Self-pay | Admitting: Family Medicine

## 2012-04-01 VITALS — BP 146/88 | HR 83 | Temp 98.6°F | Ht 62.0 in | Wt 168.0 lb

## 2012-04-01 DIAGNOSIS — I10 Essential (primary) hypertension: Secondary | ICD-10-CM

## 2012-04-01 DIAGNOSIS — M545 Low back pain: Secondary | ICD-10-CM

## 2012-04-01 DIAGNOSIS — R05 Cough: Secondary | ICD-10-CM

## 2012-04-01 DIAGNOSIS — E119 Type 2 diabetes mellitus without complications: Secondary | ICD-10-CM

## 2012-04-01 DIAGNOSIS — Z23 Encounter for immunization: Secondary | ICD-10-CM

## 2012-04-01 MED ORDER — ALBUTEROL SULFATE HFA 108 (90 BASE) MCG/ACT IN AERS: 2.0000 | INHALATION_SPRAY | Freq: Four times a day (QID) | RESPIRATORY_TRACT | Status: DC | PRN

## 2012-04-01 MED ORDER — TRAMADOL HCL 50 MG PO TABS: 50.0000 mg | ORAL_TABLET | Freq: Three times a day (TID) | ORAL | Status: DC | PRN

## 2012-04-01 NOTE — Patient Instructions (Signed)
Your blood pressure today was BP: 146/88 mmHg.  Remember your goal blood pressure is about 130/80.  Please be sure to take your medication every day.  I want you to check your blood pressure three times a week.  If it is consistently above 140 on the top number or 90 on the bottom number, please call the office for a visit to increase your blood pressure medications.   For your back - please try doing the back exercises and stretches- at leas the stretches after work.  Also, use a heating pad after work, and I have refilled the tramadol.

## 2012-04-01 NOTE — Assessment & Plan Note (Signed)
No acute changes, will refill tramadol.  Advised to do home exercise program and heating pad.

## 2012-04-01 NOTE — Assessment & Plan Note (Signed)
Pt reports better control at home than in the office.  She is agreeable to checking it three times a week and recording it, agrees to call if running above 140/90.  F/U in 3 months.

## 2012-04-01 NOTE — Progress Notes (Signed)
  Subjective:    Patient ID: Deborah Simon, female    DOB: 06/02/1951, 61 y.o.   MRN: [id number]  HPI  Deborah Simon comes in for follow up.    HTN- taking metoprolol and avelide, no side effects, checks BP's at home says it is usually 130/80.  No chest pain, dyspnea, palpitations.   DM- taking metformin 500, tolerating well.  Does not check sugars, no hypo or hyperglycemic episodes.   Back pain- chronic, waxing and waning.  No new injuries, no weakness, numbness of LE.  Works at [employer] on her feet all day.  Not doing home exercise program.    Past Medical History  Diagnosis Date  . Hypertension   . Diabetes mellitus   . Hyperlipidemia    Family History  Problem Relation Age of Onset  . Stroke Mother   . Stroke Father    History  Substance Use Topics  . Smoking status: Passive Smoke Exposure - Never Smoker  . Smokeless tobacco: Never Used  . Alcohol Use: No   Review of Systems See HPI    Objective:   Physical Exam BP 146/88  Pulse 83  Temp 98.6 F (37 C) (Oral)  Ht 5\' 2"  (1.575 m)  Wt 168 lb (76.204 kg)  BMI 30.73 kg/m2 General appearance: alert, cooperative and no distress Back: symmetric, no curvature. ROM normal. No CVA tenderness., Normal strength, sensation, reflexes of LE.  Lungs: clear to auscultation bilaterally Heart: regular rate and rhythm, S1, S2 normal, no murmur, click, rub or gallop Pulses: 2+ and symmetric       Assessment & Plan:

## 2012-04-01 NOTE — Assessment & Plan Note (Signed)
Well controlled, no changes, f/u in 3 months.

## 2012-05-04 ENCOUNTER — Other Ambulatory Visit: Payer: Self-pay | Admitting: *Deleted

## 2012-05-04 MED ORDER — RANITIDINE HCL 150 MG PO CAPS: 150.0000 mg | ORAL_CAPSULE | Freq: Two times a day (BID) | ORAL | Status: DC

## 2012-05-06 ENCOUNTER — Other Ambulatory Visit: Payer: Self-pay | Admitting: Family Medicine

## 2012-05-06 DIAGNOSIS — J309 Allergic rhinitis, unspecified: Secondary | ICD-10-CM

## 2012-05-06 MED ORDER — MOMETASONE FUROATE 50 MCG/ACT NA SUSP: 2.0000 | Freq: Every day | NASAL | Status: DC

## 2012-07-09 ENCOUNTER — Telehealth: Payer: Self-pay | Admitting: *Deleted

## 2012-07-09 ENCOUNTER — Other Ambulatory Visit: Payer: Self-pay | Admitting: Family Medicine

## 2012-07-09 DIAGNOSIS — E119 Type 2 diabetes mellitus without complications: Secondary | ICD-10-CM

## 2012-07-09 MED ORDER — METFORMIN HCL 500 MG PO TABS: 500.0000 mg | ORAL_TABLET | Freq: Two times a day (BID) | ORAL | Status: DC

## 2012-07-09 NOTE — Telephone Encounter (Signed)
Eprescribed. Thank you.

## 2012-07-09 NOTE — Telephone Encounter (Signed)
Refill request faxed from pharmacy for Glucophage. Not on med list . Placed in MD box.

## 2012-07-09 NOTE — Telephone Encounter (Signed)
Needs MD appointment prior to refilling metformin more than this prescription + 2 refills.

## 2012-07-16 ENCOUNTER — Telehealth: Payer: Self-pay | Admitting: *Deleted

## 2012-07-16 MED ORDER — IRBESARTAN-HYDROCHLOROTHIAZIDE 300-12.5 MG PO TABS: 1.0000 | ORAL_TABLET | Freq: Every day | ORAL | Status: DC

## 2012-07-16 NOTE — Telephone Encounter (Signed)
Pt needs a refill on alvalide but has never met Dr. Benjamin Stain.  Pt made appt 07/29/12, will give 2 weeks worth of meds to last till appt .Raffael Bugarin, Maryjo Rochester

## 2012-07-16 NOTE — Telephone Encounter (Signed)
RX CALLED INTO [facility] AND PT WAS INFORMED. Suvan Stcyr, Virgel Bouquet

## 2012-07-19 ENCOUNTER — Telehealth: Payer: Self-pay | Admitting: Family Medicine

## 2012-07-19 ENCOUNTER — Other Ambulatory Visit: Payer: Self-pay | Admitting: Family Medicine

## 2012-07-19 MED ORDER — OLMESARTAN MEDOXOMIL-HCTZ 40-12.5 MG PO TABS: 1.0000 | ORAL_TABLET | Freq: Every day | ORAL | Status: DC

## 2012-07-19 NOTE — Telephone Encounter (Addendum)
Called [facility].  Avalide Rx was not filled yet because they needed med strength first and will have to order it.  Returned call to patient.  Patient states that she usually gets meds from [facility] and has been taking Benicar and Toprol.  Only needs refill of Benicar HCT 40/12.5 mg.  Patient states she may have been on Avalide before, but it was changed to Benicar per [facility]. Called and left message with [facility] to call our office back to verify BP meds.  Gaylene Brooks, RN

## 2012-07-19 NOTE — Telephone Encounter (Signed)
Dawn called back from [facility].  Patient has been receiving Benicar from their pharmacy.  Will route note to Dr. Benjamin Stain for clarification of Benicar vs. Avalide.  Gaylene Brooks, RN

## 2012-07-19 NOTE — Telephone Encounter (Signed)
Called [facility].  Canceled Avalide Rx and called in Benicar Rx.  Patient will come in for appt next week with Dr. Benjamin Stain and get new Benicar Rx with 61-month supply and 3 refills to take to [facility].  Called patient to inform that Rx has been called into pharmacy, but her voicemail is not accepting incoming calls.  Will call patient back tomorrow morning.    Gaylene Brooks, RN

## 2012-07-19 NOTE — Telephone Encounter (Signed)
Patients medication that was sent on Friday to [facility], was not received and she needs it resent or called in.

## 2012-07-19 NOTE — Telephone Encounter (Signed)
Due to patient stating she gets Benicar from [facility] and does not pick up Avalide, will rx Benicar for 3 weeks to last till appointment with me and discontinue Avalide.  Will print Rx and fax to [facility].  Simone Curia 07/19/2012 2:16 PM

## 2012-07-19 NOTE — Telephone Encounter (Signed)
I am okay with Benicar for a 2-3 week supply until she sees me in clinic.  I will discontinue the avalide rx.  Can you tell me how to make sure [facility] gets the prescription for Benicar? Simone Curia

## 2012-07-29 ENCOUNTER — Ambulatory Visit: Payer: PRIVATE HEALTH INSURANCE | Admitting: Family Medicine

## 2012-08-04 ENCOUNTER — Encounter: Payer: Self-pay | Admitting: Family Medicine

## 2012-08-04 ENCOUNTER — Ambulatory Visit (INDEPENDENT_AMBULATORY_CARE_PROVIDER_SITE_OTHER): Payer: PRIVATE HEALTH INSURANCE | Admitting: Family Medicine

## 2012-08-04 VITALS — BP 116/79 | HR 82 | Ht 62.0 in | Wt 167.0 lb

## 2012-08-04 DIAGNOSIS — R059 Cough, unspecified: Secondary | ICD-10-CM

## 2012-08-04 DIAGNOSIS — E119 Type 2 diabetes mellitus without complications: Secondary | ICD-10-CM

## 2012-08-04 DIAGNOSIS — I10 Essential (primary) hypertension: Secondary | ICD-10-CM

## 2012-08-04 DIAGNOSIS — K219 Gastro-esophageal reflux disease without esophagitis: Secondary | ICD-10-CM

## 2012-08-04 DIAGNOSIS — R05 Cough: Secondary | ICD-10-CM

## 2012-08-04 DIAGNOSIS — E785 Hyperlipidemia, unspecified: Secondary | ICD-10-CM

## 2012-08-04 MED ORDER — OLMESARTAN MEDOXOMIL-HCTZ 40-12.5 MG PO TABS: 1.0000 | ORAL_TABLET | Freq: Every day | ORAL | Status: DC

## 2012-08-04 MED ORDER — RANITIDINE HCL 150 MG PO CAPS: 150.0000 mg | ORAL_CAPSULE | Freq: Two times a day (BID) | ORAL | Status: DC

## 2012-08-04 MED ORDER — PRAVASTATIN SODIUM 40 MG PO TABS: 40.0000 mg | ORAL_TABLET | Freq: Every day | ORAL | Status: DC

## 2012-08-04 MED ORDER — ALBUTEROL SULFATE HFA 108 (90 BASE) MCG/ACT IN AERS: 2.0000 | INHALATION_SPRAY | Freq: Four times a day (QID) | RESPIRATORY_TRACT | Status: DC | PRN

## 2012-08-04 NOTE — Assessment & Plan Note (Signed)
-   Re-prescribed zantac 150mg  BID

## 2012-08-04 NOTE — Progress Notes (Signed)
Subjective:     Patient ID: Deborah Simon, female   DOB: [birth year], 62 y.o.   MRN: [id number]  CC - Medication refill  HPI Deborah Simon is a 62 y.o. female with h/o obesity, HTN, HLD, DM Type II, GERD, and allergic rhinitis here for medication refill.   Went through patient's medication list with her, and she states she is not taking tessalon, nasonex, or K gluconate so removed these from pt's medication list. She is taking complete MVI daily, tylenol 500mg  BID almost daily, zyrtec daily, and flonase daily so these are being added to medication list. She is uncertain about psyllium capsule. She is taking all other medications that were on her medication list (albuterol inhaler, aspirin 81mg , cyanocobalamin daily, metformin 500mg  BID, metoprolol xl 25mg  daily, benicar HCT 40-12.5mg  daily, omega 3 fatty acids 1000mg  daily every now and then, garlic oil capsule 1000mg  daily every now and then, Pravachol 40mg  daily, ranitidine 150mg  BID, tramadol 50mg  BID. She needs refills on the benicar, pravachol, ranitidine, and albuterol.  Albuterol overuse: Pt reports using albuterol MDI 1-2 puffs TID every day for wheeziness and SOB. Problem list includes prior problem (now resolved) of cough, for which I believe this was prescribed. She thinks that days when she takes zyrtec, she feels like she needs albuterol less.  Currently, denies CP or SOB.  Review of Systems Per HPI.  Past Medical History  Diagnosis Date  . Hypertension   . Diabetes mellitus   . Hyperlipidemia       Objective:   Physical Exam BP 116/79  Pulse 82  Ht 5\' 2"  (1.575 m)  Wt 167 lb (75.751 kg)  BMI 30.54 kg/m2  SpO2 94%  GEN: NAD, sitting on exam table PULM: Normal effort with no retractions or tachypnea Oxygen saturation sitting was 100% on RA and after walking around clinic twice was 94% on RA.    Assessment:     Deborah Simon is a 62 y.o. female with h/o obesity, HTN, HLD, DM Type II, GERD, and allergic rhinitis  here for medication refill and also found to be overusing albuterol.    Plan:     (see problem list in addition to below)  # Health Maintenance:  - Colonoscopy in 2019 (last done 03/27/08 - normal exam with colon polyps and rec to return in 10 years) - Last mammogram was 2 years ago (01/2011). Gave patient information for mammogram scheduling - Return for fasting lipid panel and A1c for nurse visit - Pt has had flu shot this winter season - Last pap smear 02/26/10 was wnl - repeat this September - TDAP last 11/15/03

## 2012-08-04 NOTE — Assessment & Plan Note (Addendum)
-   Ordered A1c and lipid panel for pt to come in and get these labs while fasting (last A1c 6.19 Mar 2012) - Continue metformin 500mg  BID

## 2012-08-04 NOTE — Assessment & Plan Note (Signed)
H/o cough using albuterol TID. No SOB or chest pain currently, and with stable oxygen saturation >90 on ambulation. - Refilled albuterol (2 inhalers) - Pt to f/u with Pharmacy Clinic for PFT evaluation - F/u with me 1 week after for full physical and further evaluation of need for albuterol

## 2012-08-04 NOTE — Assessment & Plan Note (Addendum)
Fasting lipid panel last done 03/2011 and direct LDL last 08/2011 was 131 (over goal for diabetic). Return while fasting for recheck. - Continue pravastatin 40mg  daily, re-ordered today **On recheck, total cholesterol 153 and LDL 86 - currently at goal and CVD risk is 2%, making moderate-intensity statin therapy appropriate (which she is on)

## 2012-08-04 NOTE — Assessment & Plan Note (Addendum)
Pt takes toprol-xl 25mg  daily and benicar 40-12.5mg  daily - refilled benicar.

## 2012-08-04 NOTE — Patient Instructions (Addendum)
It was good to meet you today!  I recommend following up with Dr. Raymondo Band in pharmacy clinic for pulmonary function testing in 1 week or sooner.  Then see me about 1 week afterwards for follow up and a physical. I will refill 1 albuterol inhaler with 1 extra refill but want to further evaluate your need for it. I want you to come back when you are fasting and get a fasting lipid panel and a hemoglobin A1c. Please call for your yearly mammogram. You do not need a colonoscopy, flu shot, or pap smear at this time. I am also refilling your Benicar, Pravastatin, and Ranitidine at this time for 4 months.  If you have worsening difficulty breathing that does not resolve, chest pain, dizziness or passing out episodes prior to your next visit, please seek immediate medical attention.

## 2012-08-12 ENCOUNTER — Ambulatory Visit (INDEPENDENT_AMBULATORY_CARE_PROVIDER_SITE_OTHER): Payer: PRIVATE HEALTH INSURANCE | Admitting: Pharmacist

## 2012-08-12 ENCOUNTER — Other Ambulatory Visit: Payer: PRIVATE HEALTH INSURANCE

## 2012-08-12 ENCOUNTER — Encounter: Payer: Self-pay | Admitting: Pharmacist

## 2012-08-12 VITALS — BP 140/84 | HR 77 | Ht 62.0 in | Wt 165.0 lb

## 2012-08-12 DIAGNOSIS — E785 Hyperlipidemia, unspecified: Secondary | ICD-10-CM

## 2012-08-12 DIAGNOSIS — E119 Type 2 diabetes mellitus without complications: Secondary | ICD-10-CM

## 2012-08-12 DIAGNOSIS — J4530 Mild persistent asthma, uncomplicated: Secondary | ICD-10-CM | POA: Insufficient documentation

## 2012-08-12 DIAGNOSIS — J45909 Unspecified asthma, uncomplicated: Secondary | ICD-10-CM

## 2012-08-12 LAB — LIPID PANEL
Cholesterol: 153 mg/dL (ref 0–200)
VLDL: 13 mg/dL (ref 0–40)

## 2012-08-12 MED ORDER — FLUTICASONE PROPIONATE HFA 110 MCG/ACT IN AERO: 1.0000 | INHALATION_SPRAY | Freq: Two times a day (BID) | RESPIRATORY_TRACT | Status: DC

## 2012-08-12 MED ORDER — METFORMIN HCL ER 500 MG PO TB24: 1000.0000 mg | ORAL_TABLET | Freq: Two times a day (BID) | ORAL | Status: DC

## 2012-08-12 MED ORDER — FLUTICASONE PROPIONATE HFA 110 MCG/ACT IN AERO: 2.0000 | INHALATION_SPRAY | Freq: Two times a day (BID) | RESPIRATORY_TRACT | Status: DC

## 2012-08-12 NOTE — Assessment & Plan Note (Signed)
Spirometry evaluation with Pre and Post Bronchodilator reveals close to normal lung function with FVC of 90% predicted and FEV1 of 92% predicted.There was significant reversibility seen with albuterol administration with FVC improving 110% and FEV1 to 116%.    Patient has been experiencing SOB particular around allegry season for years and is taking albuterol as needed. Her results were near normal today. Gave patient peak flow to use at home to see how she is when having an exacerbation.  Continue current albuterol and start fluticasone 2 puff twice daily. Also instructed patient that she can use her flonase up to twice daily during allergy season. Reviewed results of pulmonary function tests.  Pt verbalized understanding of results.     Written pt instructions provided.  F/U Clinic visit with Dr. Benjamin Stain.  Total time in face to face counseling 45 minutes.  Patient seen with Drue Stager PharmD, Pharmacy resident.

## 2012-08-12 NOTE — Patient Instructions (Addendum)
It was nice meeting you today  1. During allergy season you can use the flonase twice a day 2. Start using fluticasone inhaler TWO puffs twice daily 3. Write down your peak flow results when you feel chest tightness 4. Start taking the new Metformin XR table 2 tabs twice daily with meals as tolerated 5. Follow up with Dr. Karie Schwalbe

## 2012-08-12 NOTE — Assessment & Plan Note (Signed)
Diabetes- patient was intolerant of metformin 1000mg  twice daily in the past and is currently on 500mg  twice daily. Metformin changed to XR form with titration instructions to 1000mg  twice daily as tolerated. A1c is at goal, but she appears to be someone who will live live with DM for a long period of time and could benefit from max dose metformin. Also spoke about importance of diet and exercise.

## 2012-08-12 NOTE — Progress Notes (Signed)
FLP DONE TODAY Deborah Simon 

## 2012-08-12 NOTE — Progress Notes (Signed)
HPI  Patient presented to clinic in good spirit for evaluation of PFTs. Patient has experienced wheezing that worsens during allergy season and is improved by zyrtec and flonase 2 sprays each nostril ONCE daily. She does still experience some chest tightness most nights which is improved by using her fan.    Objective: See Documentation Flowsheet (discrete results - PFTs) for complete Spirometry results. Patient provided good effort while attempting spirometry.   Peak flow of 400 in office with peak flow meter provided.   Albuterol Neb Lot# [id number] Exp. 10/15   Assesment/Plan: Spirometry evaluation with Pre and Post Bronchodilator reveals close to normal lung function with FVC of 90% predicted and FEV1 of 92% predicted.There was significant reversibility seen with albuterol administration with FVC improving 110% and FEV1 to 116%.    Patient has been experiencing SOB particular around allegry season for years and is taking albuterol as needed. Her results were near normal today. Gave patient peak flow to use at home to see how she is when having an exacerbation.  Continue current albuterol and start fluticasone 2 puff twice daily. Also instructed patient that she can use her flonase up to twice daily during allergy season. Reviewed results of pulmonary function tests.  Pt verbalized understanding of results.     Diabetes- patient was intolerant of metformin 1000mg  twice daily in the past and is currently on 500mg  twice daily. Metformin changed to XR form with titration instructions to 1000mg  twice daily as tolerated. A1c is at goal, but she appears to be someone who will live live with DM for a long period of time and could benefit from max dose metformin. Also spoke about importance of diet and exercise.  Written pt instructions provided.  F/U Clinic visit with Dr. Benjamin Stain.  Total time in face to face counseling 45 minutes.  Patient seen with Drue Stager PharmD, Pharmacy resident.

## 2012-08-13 NOTE — Progress Notes (Signed)
Patient ID: Deborah Simon, female   DOB: 1951/03/09, 62 y.o.   MRN: [id number] Reviewed: Agree with Dr. Macky Lower documentation and management.

## 2012-08-13 NOTE — Addendum Note (Signed)
Addended by: Kathrin Ruddy on: 08/13/2012 11:10 AM   Modules accepted: Orders

## 2012-08-19 ENCOUNTER — Ambulatory Visit (INDEPENDENT_AMBULATORY_CARE_PROVIDER_SITE_OTHER): Payer: PRIVATE HEALTH INSURANCE | Admitting: Family Medicine

## 2012-08-19 ENCOUNTER — Encounter: Payer: Self-pay | Admitting: Family Medicine

## 2012-08-19 VITALS — BP 142/79 | HR 84 | Temp 98.5°F | Ht 62.0 in | Wt 167.0 lb

## 2012-08-19 DIAGNOSIS — E785 Hyperlipidemia, unspecified: Secondary | ICD-10-CM

## 2012-08-19 DIAGNOSIS — E119 Type 2 diabetes mellitus without complications: Secondary | ICD-10-CM

## 2012-08-19 DIAGNOSIS — J309 Allergic rhinitis, unspecified: Secondary | ICD-10-CM

## 2012-08-19 DIAGNOSIS — J4530 Mild persistent asthma, uncomplicated: Secondary | ICD-10-CM

## 2012-08-19 DIAGNOSIS — I10 Essential (primary) hypertension: Secondary | ICD-10-CM

## 2012-08-19 DIAGNOSIS — J45909 Unspecified asthma, uncomplicated: Secondary | ICD-10-CM

## 2012-08-19 DIAGNOSIS — E669 Obesity, unspecified: Secondary | ICD-10-CM

## 2012-08-19 LAB — POCT GLYCOSYLATED HEMOGLOBIN (HGB A1C): Hemoglobin A1C: 6.5

## 2012-08-19 MED ORDER — FLUTICASONE PROPIONATE HFA 110 MCG/ACT IN AERO: 2.0000 | INHALATION_SPRAY | Freq: Two times a day (BID) | RESPIRATORY_TRACT | Status: DC

## 2012-08-19 MED ORDER — METFORMIN HCL ER 500 MG PO TB24: 1000.0000 mg | ORAL_TABLET | Freq: Two times a day (BID) | ORAL | Status: DC

## 2012-08-19 NOTE — Patient Instructions (Addendum)
It was great to see you today!  For your cough, please take flovent (the inhaler) two puffs inhaled into the lungs twice daily. You can also continue taking cetirizine daily, and just use flonase nasal spray as needed. Also use albuterol rescue inhaler as needed.  For diabetes, your A1c today is 6.5 (from 6.4 in Oct). Please continue current management, but try again to get metformin XR and increase slowly to 1000mg  twice daily. Should be at most $10 (and Karin Golden gives 60 tabs free). I have printed out a prescription for this.  Go ahead and schedule for a mammogram.  I have referred you to our dietitian. She should call you to schedule.  Let me know when you need refills next.  Follow up in 3-4 months or sooner if needed.

## 2012-08-19 NOTE — Progress Notes (Signed)
Subjective:     Patient ID: Deborah Simon, female   DOB: 1951-05-25, 62 y.o.   MRN: [id number]  CC - F/u DM  HPI Deborah Simon is a 62 y.o. female with h/o DM Type II, HTN, HLD and obesity here for f/u of her diabetes.   1. DM Type II: Deborah Simon saw Dr. Raymondo Band 2/27 who recommended she start metformin XR and try to take 1000mg  BID to get better DM control while avoiding GI SEs. She found this to be $199 so did not purchase it and has continued taking Metformin 500mg  BID. A1c in Oct was 6.4. Pt wants to lost weight.  2. Allergies with cough: Pt recently restarted cetirizine which has greatly reduced cough and she no longer has to use albuterol daily. At visit with Dr. Raymondo Band 2/27, PFTs were near normal but showed improvement with albuterol, suggesting reactive airway component. He rx'ed fluticasone inhaler 2 puffs BID but pt was confused and did not pick this up, thinking it was the same as flonase. She is also using flonase nasal spray as needed and will switch to nasonex when flonase runs out (due to cost w/[facility]).  3. Lipids were WNL at last check. Pt continuing to take pravastatin 40mg  daily.  Review of Systems - Per HPI.     Objective:   Physical Exam BP 142/79  Pulse 84  Temp(Src) 98.5 F (36.9 C) (Oral)  Ht 5\' 2"  (1.575 m)  Wt 167 lb (75.751 kg)  BMI 30.54 kg/m2  GEN: NAD PULM: Normal effort NEURO: Alert, normal speech, no focal deficits, EOMI     Assessment:     Deborah Simon is a 62 y.o. female with h/o well-controlled DM Type II, well-controlled HTN, well-controlled HLD and obesity here for f/u of her diabetes.     Plan:     # Health maintenance - Pt to call to set up mammogram - Pt has been good with follow-up; at next refill, okay to refill 6-12 months at a time.  # See problem list for additional

## 2012-08-20 NOTE — Assessment & Plan Note (Signed)
Pt taking flonase until runs out, then will switch to nasonex which [facility] has. - At next refill, fill nasonex.

## 2012-08-20 NOTE — Assessment & Plan Note (Signed)
LDL well-controlled at 86. Pt's [place] CVD risk at 2% and she is diabetic aged 62-75, so qualifies for moderate-intensity statin therapy which she is currently on.  - Continue pravastatin 40mg  daily

## 2012-08-20 NOTE — Assessment & Plan Note (Addendum)
A1c today is relatively unchanged at 6.5 (6.4 in October). - Printed metformin XR as pt should be able to get 500mg  tabs for relatively cheap at most drug stores (other than [facility]). Slowly increase to 1000mg  BID.  - F/u in 3-4 months and recheck A1c - At f/u recommend ophtho and do diabetic foot exam if pt hasnt had this year

## 2012-08-20 NOTE — Assessment & Plan Note (Signed)
Improvement with cetirizine daily. However, with some reactive component to airways evidenced by PFTs with Pharm clinic, recommend fluticasone inhaler. - Fluticasone inhaler 2 puffs BID - Continue cetirizine - Continue flonase if able to obtain via [facility] - Alubuterol q4 hours PRN

## 2012-08-20 NOTE — Assessment & Plan Note (Signed)
Pt wants to lose weight and would like to speak with dietitian. - Referral to Dr. Gerilyn Pilgrim placed

## 2012-09-13 ENCOUNTER — Other Ambulatory Visit: Payer: Self-pay | Admitting: Family Medicine

## 2012-09-13 DIAGNOSIS — Z1231 Encounter for screening mammogram for malignant neoplasm of breast: Secondary | ICD-10-CM

## 2012-09-15 ENCOUNTER — Ambulatory Visit: Payer: PRIVATE HEALTH INSURANCE | Admitting: Family Medicine

## 2012-09-15 ENCOUNTER — Encounter: Payer: Self-pay | Admitting: Family Medicine

## 2012-09-15 ENCOUNTER — Ambulatory Visit (INDEPENDENT_AMBULATORY_CARE_PROVIDER_SITE_OTHER): Payer: PRIVATE HEALTH INSURANCE | Admitting: Family Medicine

## 2012-09-15 VITALS — Ht 62.0 in | Wt 167.5 lb

## 2012-09-15 DIAGNOSIS — E669 Obesity, unspecified: Secondary | ICD-10-CM

## 2012-09-15 DIAGNOSIS — E119 Type 2 diabetes mellitus without complications: Secondary | ICD-10-CM

## 2012-09-15 NOTE — Patient Instructions (Addendum)
-   If you would like to be checking blood sugars at home, ask Dr. Karie Schwalbe next time you see her.   - Try soy milk instead of almond milk for its protein content.  (Or you may want to try the lactase milk.) - Cereal: Look for at least 5 grams of fiber per serving, such as bran cereals, Wheat Chex, Shredded Wheat 'n Bran.   Diet Recommendations for Diabetes   Starchy (carb) foods include: Bread, rice, pasta, potatoes, corn, crackers, bagels, muffins, all baked goods.   Protein foods include: Meat, fish, poultry, eggs, dairy foods, and beans such as pinto and kidney beans (beans also provide carbohydrate).   1. Eat at least 3 meals and 1-2 snacks per day. Never go more than 4-5 hours while awake without eating.  2. Limit starchy foods to TWO per meal and ONE per snack. ONE portion of a starchy  food is equal to the following:   - ONE slice of bread (or its equivalent, such as half of a hamburger bun).   - 1/2 cup of a "scoopable" starchy food such as potatoes or rice.   - 1 OUNCE (28 grams) of starchy snack foods such as crackers or pretzels (look on label).   - 15 grams of carbohydrate as shown on food label.  3. Both lunch and dinner should include a protein food, a carb food, and vegetables.   - Obtain twice as many veg's as protein or carbohydrate foods for both lunch and dinner.   - Try to keep frozen veg's on hand for a quick vegetable serving.     - Fresh or frozen veg's are best.  4. GOAL:  Walk at least 30 min 3 X wk.  PLAN AHEAD for the days and times you will exercise.   - Call Dr. Gerilyn Pilgrim if you need to reschedule your May 8th 1:30 appt:  [phone].

## 2012-09-15 NOTE — Progress Notes (Signed)
Medical Nutrition Therapy:  Appt start time: 1500 end time:  1600.  Assessment:  Primary concerns today: Weight management and Blood sugar control.  Deborah Simon works at [employer], varied hours, which affects her eating.  Once in a while breaks are delayed, if they are short-staffed.   Usual eating pattern includes 3 meals and 2 snacks per day. 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  She will be cutting back to part-time after turning 62 in July.   Frequent foods include water only.  Avoided foods include sodas, milk & ice cream (lactose-intolerant) most fish (disliked).    24-hr recall: (Up at 5 AM) B (6 AM)-   2 c Honey Nut cereal, 1/2 c almond milk Snk (9 AM)-   1 banana L (12 PM)-  2 c steamed cabbage, margarine, chips, water Snk (1 PM)-  chips D (4 PM)-  Large salad w/ 3/4 oz cheese & 1 egg, 2 tbsp dressing Snk (8 PM)-  10 crackers w/ peanut butter, water Every Tuesday [name] eats no meat, no sweets, no drinks other than water (for religious reasons).   Progress Towards Goal(s):  In progress.   Nutritional Diagnosis:  NI-5.8.2 Excessive carbohydrate intake As related to protein (proportion of CHO to pro).  As evidenced by 3 of 5 eating episodes yesterday being mainly carbohydrate.    Intervention:  Nutrition education.  Monitoring/Evaluation:  Dietary intake, exercise, and body weight in 4 week(s).

## 2012-09-23 ENCOUNTER — Ambulatory Visit (HOSPITAL_COMMUNITY)
Admission: RE | Admit: 2012-09-23 | Discharge: 2012-09-23 | Disposition: A | Discharge status: Home / Self Care | Payer: PRIVATE HEALTH INSURANCE | Source: Ambulatory Visit | Attending: Family Medicine | Admitting: Family Medicine

## 2012-09-23 DIAGNOSIS — Z1231 Encounter for screening mammogram for malignant neoplasm of breast: Secondary | ICD-10-CM | POA: Insufficient documentation

## 2012-10-04 ENCOUNTER — Encounter: Payer: Self-pay | Admitting: Family Medicine

## 2012-10-04 ENCOUNTER — Other Ambulatory Visit: Payer: Self-pay | Admitting: Family Medicine

## 2012-10-04 DIAGNOSIS — Z0000 Encounter for general adult medical examination without abnormal findings: Secondary | ICD-10-CM | POA: Insufficient documentation

## 2012-10-04 DIAGNOSIS — R05 Cough: Secondary | ICD-10-CM

## 2012-10-04 MED ORDER — ALBUTEROL SULFATE HFA 108 (90 BASE) MCG/ACT IN AERS: 2.0000 | INHALATION_SPRAY | Freq: Four times a day (QID) | RESPIRATORY_TRACT | Status: DC | PRN

## 2012-10-21 ENCOUNTER — Ambulatory Visit: Payer: PRIVATE HEALTH INSURANCE | Admitting: Family Medicine

## 2012-10-29 ENCOUNTER — Encounter: Payer: Self-pay | Admitting: Family Medicine

## 2012-10-29 ENCOUNTER — Ambulatory Visit (INDEPENDENT_AMBULATORY_CARE_PROVIDER_SITE_OTHER): Payer: PRIVATE HEALTH INSURANCE | Admitting: Family Medicine

## 2012-10-29 VITALS — BP 129/82 | HR 85 | Temp 98.3°F | Ht 62.0 in | Wt 166.0 lb

## 2012-10-29 DIAGNOSIS — J45909 Unspecified asthma, uncomplicated: Secondary | ICD-10-CM

## 2012-10-29 DIAGNOSIS — K5900 Constipation, unspecified: Secondary | ICD-10-CM

## 2012-10-29 DIAGNOSIS — Z23 Encounter for immunization: Secondary | ICD-10-CM

## 2012-10-29 DIAGNOSIS — E119 Type 2 diabetes mellitus without complications: Secondary | ICD-10-CM

## 2012-10-29 DIAGNOSIS — E1059 Type 1 diabetes mellitus with other circulatory complications: Secondary | ICD-10-CM

## 2012-10-29 DIAGNOSIS — J4530 Mild persistent asthma, uncomplicated: Secondary | ICD-10-CM

## 2012-10-29 LAB — POCT GLYCOSYLATED HEMOGLOBIN (HGB A1C): Hemoglobin A1C: 6.4

## 2012-10-29 MED ORDER — ZOSTER VACCINE LIVE 19400 UNT/0.65ML ~~LOC~~ SOLR: 0.6500 mL | Freq: Once | SUBCUTANEOUS | Status: DC

## 2012-10-29 NOTE — Progress Notes (Signed)
Subjective:     Patient ID: Deborah Simon, female   DOB: 1950-10-02, 62 y.o.   MRN: [id number]  CC  - discuss A1c  HPI - Deborah Simon is a 62 y.o. female with h/o diabetes mellitus type II, obesity, and asthma here for follow-up of A1c.  1. DM - Though patient is only 2 months from last A1c, very much wants to check it today. Is taking metformin 500mg  BID, as she forgot we had discussed taking 1000mg  BID at last visit. One unpleasant side effect is lack of increased stool which she had with regular metformin and appreciated due to history of constipation. Stool softeners help though. She prefers continuing with XL and readdressing when she is near finishing the medication she has already bought. Denies other side effects. Saw Dr Gerilyn Pilgrim to discuss diet/exercise and found this helpful but feels she has tools now to walk 3x/week and chart what she is eating and does not need to see her again. 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  2. Asthma - Unable to get fluticasone inhlaler started in March due to cost/MAP program not having. Uses albuterol 0-2 times weekly, has not needed in last month, due to starting daily cetirizine and nasonex daily PRN.   3. HLD - Uses statin with no SE.  Review of Systems - Per HPI; otherwise, all systems reviewed and negative.  Past Medical History  Diagnosis Date  . Hypertension   . Diabetes mellitus   . Hyperlipidemia         Objective:   Physical Exam BP 129/82  Pulse 85  Temp(Src) 98.3 F (36.8 C)  Ht 5\' 2"  (1.575 m)  Wt 166 lb (75.297 kg)  BMI 30.35 kg/m2 GEN: NAD, pleasant, well-groomed female PULM: Normal effort PSYCH: Pleasant, good mood, affect congruent, normal speech    Assessment:     Deborah Simon is a 62 y.o. female with h/o diabetes mellitus type II, obesity, and asthma here for follow-up of A1c.    Plan:     # Health maintenance - Follow up on colonoscopy records - Pneumovac  today, gave rx for zostavax though may be limited by cost - Check if had diabetic foot exam and optho exam this year at f/u - States had colonoscopy <10 years ago - follow-up if done and normal - Repeat mammogram in 1 year - done recently and negative  # See problem list for problem-specific plans.

## 2012-10-29 NOTE — Patient Instructions (Addendum)
It was great to see you again Deborah Simon!  For your diabetes, your A1c is 6.4, down from 6.5 last time.  - Continue taking metformin XL, increasing to 1000mg  twice daily. - If constipation gets worse, let me know. If you'd like, we can switch back to regular metformin once you run out of these. Take miralax and eat plenty of fiber/drink plenty of water - Your goal before the next visit is to start using the stationary bike you have 3-5 times weekly for 30 minutes. You are doing great. - We will recheck an A1c in 3 months - make a follow-up appointment for this.  For your asthma, I am glad cetirizine has helped so much. - If you start needing your albuterol >2 x weekly, we may need to reconsider starting the fluticasone inhaler.  We will give you a pneumovac shot today. I am writing a prescription for the zoster vaccine. However, without insurance, this may cost lots in which case just let me know if you decide not to get it.  Take care and seek immediate care sooner than 3 months if you develop any concerns.

## 2012-10-30 DIAGNOSIS — K5900 Constipation, unspecified: Secondary | ICD-10-CM | POA: Insufficient documentation

## 2012-10-30 NOTE — Assessment & Plan Note (Signed)
Chronic problem for patient, improved when previously on metformin, now back with metformin XL. Responds to stool softener. -Try miralax, drink plenty of water, eat plenty of fiber. - If continues to be a problem, we can go back to regular metformin as pt liked the effect on her BMs

## 2012-10-30 NOTE — Assessment & Plan Note (Signed)
Doing well with daily cetirizine helping a lot, and taking nasonex daily PRN. Unable to get fluticasone inhaler - Hold on fluticasone inhaler for now with good control - May readdress if develops poor control - Pneumovac shot today

## 2012-10-30 NOTE — Assessment & Plan Note (Addendum)
A1c 6.4 today, wonderful control, down from 6.5 previously. Weight 166lbs from 167.5 last visit April. - Will increase metformin XR 500mg  as previously prescribed to 2 tablets BID.  - once done with your bottles (before run out) let me know and we can change to regular metformin if you want for help with constipation - Your goal before the next visit is to start using the stationary bike you have 3-5 times weekly for 30 minutes. You are doing great. - Follow up for recheck A1c in 3 months

## 2012-12-06 ENCOUNTER — Other Ambulatory Visit: Payer: Self-pay | Admitting: Family Medicine

## 2012-12-06 DIAGNOSIS — R05 Cough: Secondary | ICD-10-CM

## 2012-12-06 MED ORDER — OLMESARTAN MEDOXOMIL-HCTZ 40-12.5 MG PO TABS: 1.0000 | ORAL_TABLET | Freq: Every day | ORAL | Status: DC

## 2012-12-06 MED ORDER — ALBUTEROL SULFATE HFA 108 (90 BASE) MCG/ACT IN AERS: 2.0000 | INHALATION_SPRAY | Freq: Four times a day (QID) | RESPIRATORY_TRACT | Status: DC | PRN

## 2012-12-06 NOTE — Telephone Encounter (Signed)
Refilling benicar 60 tab with 3 refills (8 month supply), patient has good f/u so ok to fill for 6-12 months at a time.

## 2012-12-09 ENCOUNTER — Other Ambulatory Visit: Payer: Self-pay | Admitting: *Deleted

## 2012-12-09 NOTE — Telephone Encounter (Signed)
Can you please call and ask patient if she needs this medication sent to [facility] on [address] or MAP program? [address].

## 2012-12-09 NOTE — Telephone Encounter (Signed)
To the health dept. Per fax

## 2012-12-14 ENCOUNTER — Other Ambulatory Visit: Payer: Self-pay | Admitting: Family Medicine

## 2012-12-14 MED ORDER — METOPROLOL SUCCINATE ER 25 MG PO TB24: 25.0000 mg | ORAL_TABLET | Freq: Every day | ORAL | Status: DC

## 2012-12-14 NOTE — Telephone Encounter (Signed)
I have it right now - putting in request. Wyatt Haste, RN-BSN

## 2012-12-14 NOTE — Telephone Encounter (Signed)
Patient stated the [facility] faxed [place] a refill request 7/1 and she needs this filled today since she is out of her medication. JW

## 2012-12-14 NOTE — Telephone Encounter (Signed)
Med called in and left on voice mail for refill. Wyatt Haste, RN-BSN

## 2012-12-14 NOTE — Telephone Encounter (Signed)
Have you seen this refill request?  Jarvis Knodel,  Syrus Nakama

## 2012-12-20 ENCOUNTER — Telehealth: Payer: Self-pay | Admitting: Family Medicine

## 2012-12-20 DIAGNOSIS — E785 Hyperlipidemia, unspecified: Secondary | ICD-10-CM

## 2012-12-20 NOTE — Telephone Encounter (Signed)
Pt is calling for a refill request for Pravastatin 40mg  to [facility] on [address]. JW

## 2012-12-20 NOTE — Telephone Encounter (Signed)
Will forward to pcp

## 2012-12-21 MED ORDER — PRAVASTATIN SODIUM 40 MG PO TABS: 40.0000 mg | ORAL_TABLET | Freq: Every day | ORAL | Status: DC

## 2012-12-21 NOTE — Telephone Encounter (Addendum)
Prescription sent. Please notify patient. Thank you

## 2012-12-21 NOTE — Telephone Encounter (Signed)
Pt is aware.  

## 2012-12-21 NOTE — Telephone Encounter (Signed)
Pt informed. Fleeger, Jessica Dawn  

## 2012-12-23 ENCOUNTER — Other Ambulatory Visit: Payer: Self-pay

## 2013-02-15 ENCOUNTER — Ambulatory Visit: Payer: No Typology Code available for payment source

## 2013-02-18 ENCOUNTER — Telehealth: Payer: Self-pay | Admitting: Family Medicine

## 2013-02-18 MED ORDER — METFORMIN HCL 1000 MG PO TABS: 1000.0000 mg | ORAL_TABLET | Freq: Two times a day (BID) | ORAL | Status: DC

## 2013-02-18 NOTE — Telephone Encounter (Signed)
Will forward to MD. Jazmin Hartsell,CMA  

## 2013-02-18 NOTE — Telephone Encounter (Signed)
The patient is calling because she is about to finish the Metformin XR so as discussed with Dr. Karie Schwalbe, she would like the Rx for the other Metformin to be sent in to [facility].  She should be finished with the current Rx by Monday, so she is going to need the new Rx very soon.

## 2013-02-18 NOTE — Telephone Encounter (Signed)
Sent prescription

## 2013-02-23 ENCOUNTER — Encounter: Payer: Self-pay | Admitting: Family Medicine

## 2013-02-23 ENCOUNTER — Ambulatory Visit (INDEPENDENT_AMBULATORY_CARE_PROVIDER_SITE_OTHER): Payer: No Typology Code available for payment source | Admitting: Family Medicine

## 2013-02-23 VITALS — BP 119/64 | HR 80 | Temp 98.1°F | Ht 62.0 in | Wt 167.0 lb

## 2013-02-23 DIAGNOSIS — E119 Type 2 diabetes mellitus without complications: Secondary | ICD-10-CM

## 2013-02-23 DIAGNOSIS — Z0000 Encounter for general adult medical examination without abnormal findings: Secondary | ICD-10-CM

## 2013-02-23 LAB — POCT GLYCOSYLATED HEMOGLOBIN (HGB A1C): Hemoglobin A1C: 6.3

## 2013-02-23 NOTE — Assessment & Plan Note (Addendum)
A1c well-controlled at 6.3 today from 6.4. Pt recently switched from XR to regular metformin 1000mg  BID, is trying to walk, and is watching carbs. Weight stable from last visit 160s lbs. - Continue metformin - Continue exercise and watching carbs - Foot exam and retinal scan done today. Encouraged good foot care and regular inspection - Follow up in 6 months for next A1c and weight management follow up

## 2013-02-23 NOTE — Patient Instructions (Addendum)
Retinal scan and flu shot today. Your A1c is stable today - good job! Continue taking medication regularly, eating foods with not much added sugar or carbohydrates, and exercising regularly. Follow up in 6 months and we can recheck your diabetes and follow up on your weight management.  If you develop concerns in the meantime, see me sooner.

## 2013-02-23 NOTE — Progress Notes (Signed)
Patient ID: Deborah Simon, female   DOB: 02-Sep-1950, 62 y.o.   MRN: [id number] Subjective:   CC: Follow up diabetes  HPI:   1. Diabetes -   DM Type II:  Patient is compliant with metformin 1000mg  BID. Not checking blood sugars. No hypoglycemic symptoms. ROS -  Positive: none Negative: CP, HA, vision changes, GI upset. Tingling in hands and feet Vision: Seen 2 years ago, unsure where. Does not have appt set up  Pt reports trying to "eat right" and walking 1 hour 3x/week when weather permits. She has not walked this week due to heat but plans to start back now. Son got rid of her bike.   2. Asthma - Not using flovent except once month, due to improvement with cetirizine and cost of flovent.  3. HTN - Compliant with medications. Plans to get new BP monitor. Has not had headache which is her usual indicator that she has high blood pressure.  Review of Systems - Per HPI.    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  PMH: Meds reviewed. No longer needing tramadol for low back pain. Removed from list.   Objective:  Physical Exam BP 119/64  Pulse 80  Temp(Src) 98.1 F (36.7 C) (Oral)  Ht 5\' 2"  (1.575 m)  Wt 167 lb (75.751 kg)  BMI 30.54 kg/m2 GEN: NAD HEENT: Atraumatic, normocephalic, neck supple, EOMI CV: 2+ bilateral DP pulses regular rate PULM: normal effort SKIN: No rash or cyanosis; warm and well-perfused EXTR: No lower extremity edema or calf tenderness PSYCH: Mood and affect euthymic, normal rate and volume of speech NEURO: Awake, alert, no focal deficits grossly, normal speech Diabetic foot exam:  Visual: Bunyon bilaterally that is nontender and nonerythematous; otherwise, no deformity or skin breakdown.  Sensation: in tact bilaterally to monofilament test to all parts except bilateral heels that have thickened skin.  Pulse: DP in tact bilaterally  Assessment:     Deborah Simon is a 62 y.o. female with h/o DM Type II here for Follow Up.    Plan:     # See problem list for problem-specific plans. - Flu shot given today - Zoster shot too expensive.

## 2013-02-25 ENCOUNTER — Encounter: Payer: Self-pay | Admitting: Family Medicine

## 2013-05-17 ENCOUNTER — Encounter: Payer: Self-pay | Admitting: Family Medicine

## 2013-05-19 ENCOUNTER — Ambulatory Visit: Payer: No Typology Code available for payment source

## 2013-06-14 ENCOUNTER — Other Ambulatory Visit: Payer: Self-pay | Admitting: Family Medicine

## 2013-07-22 ENCOUNTER — Other Ambulatory Visit: Payer: Self-pay | Admitting: Family Medicine

## 2013-07-22 MED ORDER — OLMESARTAN MEDOXOMIL-HCTZ 40-12.5 MG PO TABS: 1.0000 | ORAL_TABLET | Freq: Every day | ORAL | Status: DC

## 2013-09-06 ENCOUNTER — Other Ambulatory Visit: Payer: Self-pay | Admitting: Family Medicine

## 2013-10-14 ENCOUNTER — Ambulatory Visit (INDEPENDENT_AMBULATORY_CARE_PROVIDER_SITE_OTHER): Payer: No Typology Code available for payment source | Admitting: Family Medicine

## 2013-10-14 ENCOUNTER — Encounter: Payer: Self-pay | Admitting: Family Medicine

## 2013-10-14 VITALS — BP 148/82 | HR 92 | Temp 97.8°F | Ht 62.0 in | Wt 173.0 lb

## 2013-10-14 DIAGNOSIS — J4530 Mild persistent asthma, uncomplicated: Secondary | ICD-10-CM

## 2013-10-14 DIAGNOSIS — K219 Gastro-esophageal reflux disease without esophagitis: Secondary | ICD-10-CM

## 2013-10-14 DIAGNOSIS — J45909 Unspecified asthma, uncomplicated: Secondary | ICD-10-CM

## 2013-10-14 DIAGNOSIS — I10 Essential (primary) hypertension: Secondary | ICD-10-CM

## 2013-10-14 DIAGNOSIS — K5900 Constipation, unspecified: Secondary | ICD-10-CM

## 2013-10-14 DIAGNOSIS — E119 Type 2 diabetes mellitus without complications: Secondary | ICD-10-CM

## 2013-10-14 LAB — POCT GLYCOSYLATED HEMOGLOBIN (HGB A1C): Hemoglobin A1C: 6.6

## 2013-10-14 MED ORDER — METFORMIN HCL 500 MG PO TABS: 500.0000 mg | ORAL_TABLET | Freq: Two times a day (BID) | ORAL | Status: DC

## 2013-10-14 MED ORDER — RANITIDINE HCL 150 MG PO TABS: ORAL_TABLET | ORAL | Status: DC

## 2013-10-14 MED ORDER — METOPROLOL SUCCINATE ER 25 MG PO TB24: 25.0000 mg | ORAL_TABLET | Freq: Every day | ORAL | Status: DC

## 2013-10-14 NOTE — Progress Notes (Signed)
Patient ID: Deborah Simon, female   DOB: 11/23/1950, 63 y.o.   MRN: [id number]  Subjective:   CC: Follow up diabetes, asthma, and HCTZ  HPI:    Diabetes  Due to frequent stools, patient changed from metformin 1000mg  BID to 500mg  BID and would like new rx. She no longer has bowel issues. She does not check blood sugar at home. Denies signs of hypo or hyperglycemia. Denies chest pain, vision blurriness, dyspnea, or headaches. Has less tinging hands and feet than previously, possibly due to less standing (right foot bunyon). She feels better on days she does not work. She does not have ophthalmologist. She had previously been exercising by walking 1 hr 3x/week but has stopped due to weather change. She tries to "eat right" with lots of fruits, drinking water, and not drinking teas/sodas. She does drink OJ and other juices most days.  HTN Compliant with medicatinos (benicar-HCTZ 40-12.5mg  daily, toprol-XL 25mg  daily) but wants to switch from benicar-Hctz to something that is cheaper. Home BPs 120/70s though elevated in clinic today. Denies headache (her usual indicator of HTN). Denies chest pain or dyspnea.  Asthma Not using flovent or albuterol at all because taking daily cetirizine.  Side pain  Reports severe right-sided pain after eating [place] a few days ago that lasted half the night and resolved after having a BM. Pain was epigastric and right-sided up to her shoulder. This is the only time that has occurred and did not recur.   Review of Systems - Per HPI.    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  PMH: Meds reviewed. Prava not daily bc feels good Takes potassium 99mg  daily Fiber pill daily, docusate 1-00mg  bid  Objective:  Physical Exam BP 148/82  Pulse 92  Temp(Src) 97.8 F (36.6 C) (Oral)  Ht 5\' 2"  (1.575 m)  Wt 173 lb (78.472 kg)  BMI 31.63 kg/m2 GEN: NAD, pleasant, well-groomed HEENT: Atraumatic, normocephalic, neck supple, EOMI, sclera  clear  CV: RRR, II/VI systolic murmur Right and LUSB, 1+ bilateral DP and radial pulses PULM: CTAB, normal effort ABD: Soft, nontender, nondistended, obese SKIN: No rash or cyanosis; warm and well-perfused EXTR: No lower extremity edema or calf tenderness PSYCH: Mood and affect euthymic, normal rate and volume of speech NEURO: Awake, alert, no focal deficits grossly, normal speech  Assessment:     Deborah BrunetteBeverly Jindra is a 63 y.o. female with h/o DM Type II here for Follow Up.    Plan:     Diabetes Well controlled with last A1c 6.3 9/10. - A1c today>>6.6.  - Continue metformin 500mg  BID - re-prescribed for 90 day supply at a time. - Discussed cutting back on juices and restarting walking 3x/week. - Lab-only appt and will order future lipid panel. - F/u 3 mo. Needs foot exam. - Ophthalmology referral placed.  HTN Well-controlled at home, likely white-coat HTN in office. - Refilled toprol XL 90 day supply. - Will send rx for lisinopril-HCTZ to [facility] and ask them to send other covered drugs if not covered.  Asthma Well-controlled, takes cetirizine daily. - continue cetirizine  Side pain  Likely indigestion vs constipation. - Monitor for recurrence or fever or other symptoms which would warrant evaluation.  GERD - Refilled zantac per pt request  Of note patient takes K daily - check BMET as future lab  Follow-up 3 months for next DM checkup.

## 2013-10-14 NOTE — Patient Instructions (Signed)
It was good to see you.  I am referring you to ophthalmology for annual eye visits. If you do not hear from them in 1 week, call us. Let me know if you have trouble getting your meds. Come back for follow up in 3 months. Your A1c is well controlled at 6.6 today! Good work. Get back into walking and cut back on orange juice.  Best,  Leona SingletonMaria T Rameen Quinney, MD

## 2013-10-17 ENCOUNTER — Telehealth: Payer: Self-pay | Admitting: Family Medicine

## 2013-10-17 ENCOUNTER — Other Ambulatory Visit: Payer: No Typology Code available for payment source

## 2013-10-17 DIAGNOSIS — E119 Type 2 diabetes mellitus without complications: Secondary | ICD-10-CM

## 2013-10-17 DIAGNOSIS — I10 Essential (primary) hypertension: Secondary | ICD-10-CM

## 2013-10-17 LAB — BASIC METABOLIC PANEL
BUN: 12 mg/dL (ref 6–23)
CHLORIDE: 101 meq/L (ref 96–112)
CO2: 25 meq/L (ref 19–32)
Calcium: 10.4 mg/dL (ref 8.4–10.5)
Creat: 0.97 mg/dL (ref 0.50–1.10)
Glucose, Bld: 149 mg/dL — ABNORMAL HIGH (ref 70–99)
Potassium: 4 mEq/L (ref 3.5–5.3)
Sodium: 141 mEq/L (ref 135–145)

## 2013-10-17 LAB — LIPID PANEL
Cholesterol: 171 mg/dL (ref 0–200)
HDL: 48 mg/dL (ref >39)
LDL CALC: 102 mg/dL — AB (ref 0–99)
Total CHOL/HDL Ratio: 3.6 Ratio
Triglycerides: 106 mg/dL (ref <150)
VLDL: 21 mg/dL (ref 0–40)

## 2013-10-17 MED ORDER — LISINOPRIL-HYDROCHLOROTHIAZIDE 20-12.5 MG PO TABS: 1.0000 | ORAL_TABLET | Freq: Every day | ORAL | Status: DC

## 2013-10-17 NOTE — Assessment & Plan Note (Signed)
-   Refilled zantac per pt request

## 2013-10-17 NOTE — Telephone Encounter (Signed)
LM for patient to call back.  Please give message from MD when she does.  Thanks Chia Mowers,CMA  

## 2013-10-17 NOTE — Assessment & Plan Note (Signed)
Well-controlled, takes cetirizine daily. - continue cetirizine

## 2013-10-17 NOTE — Assessment & Plan Note (Signed)
Well-controlled at home, likely white-coat HTN in office. - Refilled toprol XL 90 day supply. - Will send rx for lisinopril-HCTZ to [facility] and ask them to send other covered drugs if not covered.

## 2013-10-17 NOTE — Progress Notes (Signed)
BMP AND FLP DONE TODAY Stanly Si 

## 2013-10-17 NOTE — Telephone Encounter (Signed)
Actually, went ahead and [facility]. Have her call them to see if it is covered, and if not, I will send to MAP.  ThxLeona Singleton.  Gray Doering T Kenyada Hy, MD

## 2013-10-17 NOTE — Telephone Encounter (Signed)
Pt called because she said that Dr. Karie Schwalbe was going to replace her Benicar with another medication. She said that it was not called in with her another medications. Please call in or fax to MAP program. jw

## 2013-10-17 NOTE — Assessment & Plan Note (Signed)
Side pain Likely indigestion vs constipation. - Monitor for recurrence or fever or other symptoms which would warrant evaluation.

## 2013-10-17 NOTE — Assessment & Plan Note (Signed)
Well controlled with last A1c 6.3 9/10. - A1c today>>6.6.  - Continue metformin 500mg  BID - re-prescribed for 90 day supply at a time. - Discussed cutting back on juices and restarting walking 3x/week. - Lab-only appt and will order future lipid panel. - F/u 3 mo. Needs foot exam. - Ophthalmology referral placed.

## 2013-10-17 NOTE — Telephone Encounter (Signed)
Please let her know I apologize for forgetting to do this and will send something in later today when I get over to clinic.   Deborah SingletonMaria T Cherrise Occhipinti, MD

## 2013-10-18 ENCOUNTER — Telehealth: Payer: Self-pay | Admitting: Family Medicine

## 2013-10-18 MED ORDER — ATORVASTATIN CALCIUM 40 MG PO TABS: 40.0000 mg | ORAL_TABLET | Freq: Every day | ORAL | Status: DC

## 2013-10-18 NOTE — Telephone Encounter (Signed)
Called patient to discuss labs.  Cholesterol Patient's 10 year ASCVD risk is 22.9% and she is a diabetic. Would favor high intensity statin due to risk >7.5%. She is currently on pravastatin 40mg  daily (moderate-intensity statin). She admits she was not taking that daily. We discussed my preference to start atorvastatin 40mg  daily but she prefers finishing the pravastatin she just picked up first and then starting atorvastatin.  - [facility] and pt will start once finishes pravastatin she just picked up. - Pt to take statin daily. - Recheck lipids 4-6 months.  HTN Yesterday, [facility] of Benicar-HCTZ since the latter was too expensive.  - Pt informed and will check price of lisinopril-HCTZ and have pharmacy notify us of alternative if too expensive.  Leona SingletonMaria T Kendelle Schweers, MD

## 2013-10-21 ENCOUNTER — Telehealth: Payer: Self-pay | Admitting: Family Medicine

## 2013-10-21 DIAGNOSIS — R05 Cough: Secondary | ICD-10-CM

## 2013-10-21 DIAGNOSIS — R059 Cough, unspecified: Secondary | ICD-10-CM

## 2013-10-21 NOTE — Telephone Encounter (Signed)
Pt called because she knows that she told Dr. Karie Schwalbe that she didn't need the albuterol, but she does need it. [facility] would [facility]. kw

## 2013-10-24 MED ORDER — ALBUTEROL SULFATE HFA 108 (90 BASE) MCG/ACT IN AERS: 2.0000 | INHALATION_SPRAY | Freq: Four times a day (QID) | RESPIRATORY_TRACT | Status: DC | PRN

## 2013-10-24 NOTE — Telephone Encounter (Signed)
Please let her know I have sent this in. Thank you!  Deborah SingletonMaria T Jadrian Bulman, MD

## 2013-10-24 NOTE — Telephone Encounter (Signed)
LM for patient "medication has been sent into pharmacy. "  Burnard HawthorneJazmin Yazmina Pareja,CMA

## 2013-11-10 ENCOUNTER — Other Ambulatory Visit: Payer: Self-pay | Admitting: *Deleted

## 2013-11-10 ENCOUNTER — Telehealth: Payer: Self-pay | Admitting: Family Medicine

## 2013-11-10 NOTE — Telephone Encounter (Signed)
90 day supply. Oluwatamilore Starnes L Darcell Yacoub, RN  

## 2013-11-10 NOTE — Telephone Encounter (Signed)
Pt called and needs a refill on her Lipitor called in to a mail order pharmacy that is through her insurance and this has to be in a 90 day supple for them to cover this. Please call [phone] and hit 3 to call in. jw

## 2013-11-14 MED ORDER — ATORVASTATIN CALCIUM 40 MG PO TABS: 40.0000 mg | ORAL_TABLET | Freq: Every day | ORAL | Status: DC

## 2013-11-14 MED ORDER — METFORMIN HCL 500 MG PO TABS: 500.0000 mg | ORAL_TABLET | Freq: Two times a day (BID) | ORAL | Status: DC

## 2013-11-15 NOTE — Telephone Encounter (Signed)
Blue team could you call this in for her? Thanks. Leona Singleton, MD

## 2013-11-16 MED ORDER — ATORVASTATIN CALCIUM 40 MG PO TABS: 40.0000 mg | ORAL_TABLET | Freq: Every day | ORAL | Status: DC

## 2013-11-16 NOTE — Telephone Encounter (Signed)
[facility]. Polina Burmaster,CMA

## 2013-11-29 ENCOUNTER — Other Ambulatory Visit: Payer: Self-pay | Admitting: Family Medicine

## 2013-11-29 DIAGNOSIS — Z1231 Encounter for screening mammogram for malignant neoplasm of breast: Secondary | ICD-10-CM

## 2013-12-02 ENCOUNTER — Ambulatory Visit (HOSPITAL_COMMUNITY)
Admission: RE | Admit: 2013-12-02 | Discharge: 2013-12-02 | Disposition: A | Discharge status: Home / Self Care | Payer: No Typology Code available for payment source | Source: Ambulatory Visit | Attending: Family Medicine | Admitting: Family Medicine

## 2013-12-02 ENCOUNTER — Telehealth: Payer: Self-pay | Admitting: Family Medicine

## 2013-12-02 DIAGNOSIS — Z1231 Encounter for screening mammogram for malignant neoplasm of breast: Secondary | ICD-10-CM | POA: Insufficient documentation

## 2013-12-02 NOTE — Telephone Encounter (Signed)
Patient states that new HTN meds do not work as well as Tourist information centre managerBenicar. She has been checking her BP daily and has had some elevated readings. Would like to discuss either starting Benicar again or trying something different that will still be in her price range. Please call on cell.

## 2013-12-05 ENCOUNTER — Telehealth: Payer: Self-pay | Admitting: Family Medicine

## 2013-12-05 NOTE — Telephone Encounter (Signed)
Pt called because she is now taking Lisinopril and she is taking 2 instead of one since that seems to keep her BP on track. One pill was not helping. Please call and advise her on if that is okay or what she should do. jw

## 2013-12-06 NOTE — Telephone Encounter (Signed)
It is okay to take 2 (she has basically increased her dose to lisinopril-HCTZ 40-25mg  daily which is an appropriate dose).  - Please ask her to tell you what readings were on 1 lisinopril-hctz tablet and what they now are on 2.  - I would like to see her in 2-3 weeks so we can check some labs. If she feels lightheaded, I would like her to check her BP and record that for me.  - If her BP drops below 110 systolic or 70 diastolic, I want her to only take 1 tablet that day.  Deborah SingletonMaria T Kesley Mullens, MD

## 2013-12-07 NOTE — Telephone Encounter (Signed)
Pt states that she has been taking 2 pills since Friday (6/19) and her BP have been better around 130/80.  She will keep track of these and call back to make appt. Fleeger, Maryjo RochesterJessica Dawn

## 2013-12-08 ENCOUNTER — Encounter: Payer: Self-pay | Admitting: Family Medicine

## 2013-12-26 ENCOUNTER — Telehealth: Payer: Self-pay | Admitting: Family Medicine

## 2013-12-26 MED ORDER — LISINOPRIL-HYDROCHLOROTHIAZIDE 20-12.5 MG PO TABS: 2.0000 | ORAL_TABLET | Freq: Every day | ORAL | Status: DC

## 2013-12-26 NOTE — Telephone Encounter (Signed)
Tried to call patient but phone only rang and was unable to leave a message.  Please inform that rx was sent to pharmacy and that she needs a follow up appt to see Dr. Karie Schwalbe.  Thanks Jazmin Hartsell,CMA

## 2013-12-26 NOTE — Telephone Encounter (Signed)
I refilled it but I had wanted her to follow up 2-3 weeks from our last visit to recheck BP and labs, so please have her make this appt.  Deborah SingletonMaria T [name], MD

## 2013-12-26 NOTE — Telephone Encounter (Signed)
Patient was advised to double her dosage of Lisinopril. She is now almost out and needs RX to be changed to 2 tabs daily. Please advise.

## 2013-12-27 ENCOUNTER — Telehealth: Payer: Self-pay | Admitting: *Deleted

## 2013-12-27 ENCOUNTER — Telehealth: Payer: Self-pay | Admitting: Family Medicine

## 2013-12-27 DIAGNOSIS — I10 Essential (primary) hypertension: Secondary | ICD-10-CM

## 2013-12-27 MED ORDER — LISINOPRIL-HYDROCHLOROTHIAZIDE 20-12.5 MG PO TABS: 2.0000 | ORAL_TABLET | Freq: Every day | ORAL | Status: DC

## 2013-12-27 NOTE — Telephone Encounter (Signed)
[facility] requesting a new Rx for Lisinop-HCTZ.  [facility]/26/[facility].  Pt called to verify pharmacy.  Pt stated [facility].  [facility]  Pt also advised to schedule an appt with PCP for blood pressure f/u and lab work in the next 2-3 weeks.  Pt stated wants she gets her work schedule she will call back and schedule.  Clovis PuMartin, [name] L, RN

## 2013-12-27 NOTE — Telephone Encounter (Signed)
Pt called earlier today and needed [facility]. Her home delivery service has already mailed her's out and will not need us to send a refill to [facility]. jw

## 2013-12-27 NOTE — Telephone Encounter (Signed)
From notes from [name], it looks like this issue has been resolved but I wills end to her to advise.  Deborah SingletonMaria T Deborah Menn, MD

## 2014-02-10 ENCOUNTER — Other Ambulatory Visit: Payer: Self-pay | Admitting: *Deleted

## 2014-02-12 MED ORDER — METOPROLOL SUCCINATE ER 25 MG PO TB24: 25.0000 mg | ORAL_TABLET | Freq: Every day | ORAL | Status: DC

## 2014-02-24 ENCOUNTER — Ambulatory Visit (INDEPENDENT_AMBULATORY_CARE_PROVIDER_SITE_OTHER): Payer: No Typology Code available for payment source | Admitting: Family Medicine

## 2014-02-24 VITALS — BP 120/76 | HR 72 | Temp 98.2°F | Ht 62.0 in | Wt 164.5 lb

## 2014-02-24 DIAGNOSIS — I10 Essential (primary) hypertension: Secondary | ICD-10-CM

## 2014-02-24 DIAGNOSIS — E119 Type 2 diabetes mellitus without complications: Secondary | ICD-10-CM

## 2014-02-24 MED ORDER — ONETOUCH ULTRA MINI W/DEVICE KIT: 1.0000 | PACK | Status: DC | PRN

## 2014-02-24 NOTE — Progress Notes (Signed)
Patient ID: Deborah Simon, female   DOB: [birth year], 63 y.o.   MRN: [id number] Subjective:   CC: Follow up HTN and DM  HPI:   F/u DM A1c 6.6 in May Metformin 500 BID patient is taking regularly. No symptoms of hyper/hypoglycemia (diaphoresis, anxiety, blurred vision, polyuria, polydypsia, nausea, confusion). Discussed juice and walking - She has switched to more vegetable juices, breakfast and lunch daily, and walking 2x/week 3 miles. Still needs foot exam. STarted lipitor due to ASCVD risk.  Saw ophthalmology who prescribed new glasses and did diabetic eye exam that was normal per patient.  F/u HTN Well controlled at home with elevated BP in office last visit. Today, BP well controlled and she has lost 9 lbs since last visit intentionally. Denies chest pain, dyspnea, leg swelling, blurred vision, or dizziness/syncope. After last visit, pt self-increased lisinopril-HCTZ. Numbers at home well-controlled in 120-130s/70s.   Review of Systems - Per HPI.   PMH: Reviewed, meds reviewed SH: Never smoker    Objective:  Physical Exam BP 120/76  Pulse 72  Temp(Src) 98.2 F (36.8 C) (Oral)  Ht  (1.575 m)  Wt 164 lb 8 oz (74.617 kg)  BMI 30.08 kg/m2 GEN: NAD CV: RRR, no m/r/g PULM: CTAB, normal effort EXT: No LE edema or calf tenderness Foot exam: 2+ bilateral DP pulses, no skin breakdown, bilateral bunions present, normal monifilament test except no sensation 3rd MTP joint and heel bilaterally.    Assessment:     Deborah Simon is a 63 y.o. female with h/o DM and HTN here for f/u of both.    Plan:     F/u DM A1c previously well-controlled, not checked today. BP well controlled. Started lipitor recently. Nl eye exam reportedly. Foot exam today with decreased monofilament testing at heels and under 3rd MTP joint bilaterally. - Continue wonderful weight los efforts. Add 1 day per week. Three healthy meals daily. - Check lipids in 4-6 weeks. - Continue current regimen. -  Ordered blood sugar monitor per pt request. Informed her she does not need to check frequently. - F/u 3-6 mo  F/u HTN Well-controlled on current regimen. Dieting and exercising. Lost 9 lbs since last visit. - Continue current medication, diet, and exercise.  - BMET today given increase in lisinopril-HCTZ. - F/u 3-6 months.   Follow-up: Follow up in 3-6 months for f/u of DM and HTN.   Leona Singleton, MD [facility]

## 2014-02-24 NOTE — Patient Instructions (Addendum)
Great to see you.  You are doing a great job with weight loss through diet and exercise!! Continue working on exercise - one goal would be to add 1 more day to the week. Keep an eye on having three healthy meals per day, especially breakfast and lunch.  Continue taking medications as prescribed.  We are checking labs and I will call if anything is NOT normal.  Best,  Leona Singleton, MD

## 2014-02-25 LAB — BASIC METABOLIC PANEL
BUN: 8 mg/dL (ref 6–23)
CHLORIDE: 102 meq/L (ref 96–112)
CO2: 26 mEq/L (ref 19–32)
Calcium: 9.9 mg/dL (ref 8.4–10.5)
Creat: 0.93 mg/dL (ref 0.50–1.10)
GLUCOSE: 130 mg/dL — AB (ref 70–99)
POTASSIUM: 3.9 meq/L (ref 3.5–5.3)
SODIUM: 138 meq/L (ref 135–145)

## 2014-02-26 ENCOUNTER — Encounter: Payer: Self-pay | Admitting: Family Medicine

## 2014-02-26 NOTE — Assessment & Plan Note (Signed)
Well-controlled on current regimen. Dieting and exercising. Lost 9 lbs since last visit. - Continue current medication, diet, and exercise.  - BMET today given increase in lisinopril-HCTZ. - F/u 3-6 months.

## 2014-02-26 NOTE — Assessment & Plan Note (Signed)
A1c previously well-controlled, not checked today. BP well controlled. Started lipitor recently. Nl eye exam reportedly. Foot exam today with decreased monofilament testing at heels and under 3rd MTP joint bilaterally. - Continue wonderful weight los efforts. Add 1 day per week. Three healthy meals daily. - Check lipids in 4-6 weeks. - Continue current regimen. - Ordered blood sugar monitor per pt request. Informed her she does not need to check frequently. - F/u 3-6 mo

## 2014-03-01 ENCOUNTER — Encounter: Payer: Self-pay | Admitting: Family Medicine

## 2014-03-06 ENCOUNTER — Telehealth: Payer: Self-pay | Admitting: Family Medicine

## 2014-03-06 MED ORDER — RANITIDINE HCL 150 MG PO TABS: ORAL_TABLET | ORAL | Status: DC

## 2014-03-06 NOTE — Telephone Encounter (Signed)
LMOVM for pt to return call.  Please inform when she returns call that I contacted [facility], they advised that they do not need additional info from [place], that she will just need to call and let them know she wants the glucometer sent out.  She can call [phone]   DR. THEKKEKANDAM, Also forwarding to you to send in ranitidine. Deborah Simon, Deborah Simon

## 2014-03-06 NOTE — Telephone Encounter (Signed)
Pt called because the Blood Glucose Monitoring Supply w/device kit will be free but [facility] has some additional information they need from [place] and we need to call [phone]. Also her Ranitidine needs to be sent to [facility] now also since it is a lot cheaper. She is filling a 30 day supply at [facility] but we need to go ahead and send this to them since it takes a while for them to process new prescriptions. jw

## 2014-03-06 NOTE — Telephone Encounter (Signed)
Sent ranitidine rx to [facility]. Thanks.  Leona Singleton, MD

## 2014-03-17 ENCOUNTER — Telehealth: Payer: Self-pay | Admitting: Family Medicine

## 2014-03-17 MED ORDER — FREESTYLE FREEDOM LITE W/DEVICE KIT: 1.0000 [IU] | PACK | Freq: Once | Status: DC

## 2014-03-17 NOTE — Telephone Encounter (Signed)
Pt is still having issues with getting her glucometer.  [facility], the issue is that the one touch ultra is not covered but the freestyle lite is.  Contacted [facility] and called in the freestyle meter verbally and changed in pt chart.  Pt informed.  Fleeger, Maryjo RochesterJessica Dawn

## 2014-03-30 ENCOUNTER — Other Ambulatory Visit: Payer: Self-pay | Admitting: Family Medicine

## 2014-03-30 NOTE — Telephone Encounter (Signed)
Patient calls for refill of this meds.

## 2014-03-31 NOTE — Telephone Encounter (Signed)
Refilled.  Deborah SingletonMaria T Jodette Wik, MD

## 2014-03-31 NOTE — Telephone Encounter (Deleted)
   Leona SingletonMaria T Aysen Shieh, MD

## 2014-04-04 ENCOUNTER — Ambulatory Visit (INDEPENDENT_AMBULATORY_CARE_PROVIDER_SITE_OTHER): Payer: No Typology Code available for payment source | Admitting: *Deleted

## 2014-04-04 DIAGNOSIS — Z23 Encounter for immunization: Secondary | ICD-10-CM

## 2014-08-10 ENCOUNTER — Encounter: Payer: Self-pay | Admitting: Family Medicine

## 2014-08-10 ENCOUNTER — Ambulatory Visit (INDEPENDENT_AMBULATORY_CARE_PROVIDER_SITE_OTHER): Payer: No Typology Code available for payment source | Admitting: Family Medicine

## 2014-08-10 VITALS — BP 136/73 | HR 79 | Temp 98.5°F | Wt 164.4 lb

## 2014-08-10 DIAGNOSIS — J209 Acute bronchitis, unspecified: Secondary | ICD-10-CM

## 2014-08-10 MED ORDER — BENZONATATE 200 MG PO CAPS: 200.0000 mg | ORAL_CAPSULE | Freq: Three times a day (TID) | ORAL | Status: DC | PRN

## 2014-08-10 MED ORDER — ACETAMINOPHEN-CODEINE #3 300-30 MG PO TABS: 1.0000 | ORAL_TABLET | Freq: Four times a day (QID) | ORAL | Status: DC | PRN

## 2014-08-10 NOTE — Progress Notes (Signed)
  Subjective:     Deborah Simon is a 64 y.o. female here for evaluation of a cough. Onset of symptoms was 4 days ago. Symptoms have been unchanged since that time. The cough is dry and is aggravated by nothing. Associated symptoms include: postnasal drip. Patient does have a history of asthma. Patient does not have a history of environmental allergens. Patient has not traveled recently. Patient does not have a history of smoking. Patient has not had a previous chest x-ray. Patient has not had a PPD done.  Denies N/V/D, abdominal pain, severe chills or sweats.  Did have one febrile episode to [address] F but nothing since two days ago.    The following portions of the patient's history were reviewed and updated as appropriate: allergies, current medications, past family history, past medical history, past social history, past surgical history and problem list.  Review of Systems Pertinent items are noted in HPI.    Objective:    Oxygen saturation 97% on room air BP 136/73 mmHg  Pulse 79  Temp(Src) 98.5 F (36.9 C) (Oral)  Wt 164 lb 7 oz (74.588 kg) General appearance: alert, cooperative and appears stated age Throat: o/P clear Lungs: clear to auscultation bilaterally Heart: regular rate and rhythm    Assessment:    Acute Bronchitis    Plan:    Explained lack of efficacy of antibiotics in viral disease. Antitussives per medication orders. Avoid exposure to tobacco smoke and fumes. B-agonist inhaler. Call if shortness of breath worsens, blood in sputum, change in character of cough, development of fever or chills, inability to maintain nutrition and hydration. Avoid exposure to tobacco smoke and fumes.

## 2014-08-10 NOTE — Patient Instructions (Signed)

## 2014-08-11 ENCOUNTER — Telehealth: Payer: Self-pay | Admitting: Family Medicine

## 2014-08-11 NOTE — Telephone Encounter (Addendum)
The medication prescribed yesterday for patient's cough and congestion is not helping. Patient report that she's still having a lot of coughing and unable to sleep.  Would like a Zpak called in.

## 2014-08-11 NOTE — Telephone Encounter (Signed)
Will forward to PCP to review. Catie Chiao, CMA. 

## 2014-08-11 NOTE — Telephone Encounter (Signed)
Pt calling back, did not see her pcp, she saw Dr. Paulina FusiHess yesterday, he is here today, please advise.

## 2014-08-11 NOTE — Telephone Encounter (Signed)
Discussed Sx relief for sinus/nasal rhinitis she is having and if no better with Nasal Saline, Mucinex, Nasonex, and Afrin by Monday, can consider ABx.  Pt amenable to this plan.  Thanks, Twana FirstBryan R. Paulina FusiHess, DO [facility] 08/11/2014, 2:38 PM

## 2014-08-15 ENCOUNTER — Ambulatory Visit (INDEPENDENT_AMBULATORY_CARE_PROVIDER_SITE_OTHER): Payer: No Typology Code available for payment source | Admitting: Family Medicine

## 2014-08-15 ENCOUNTER — Encounter: Payer: Self-pay | Admitting: Family Medicine

## 2014-08-15 ENCOUNTER — Ambulatory Visit
Admission: RE | Admit: 2014-08-15 | Discharge: 2014-08-15 | Disposition: A | Discharge status: Home / Self Care | Payer: No Typology Code available for payment source | Source: Ambulatory Visit | Attending: Family Medicine | Admitting: Family Medicine

## 2014-08-15 VITALS — BP 143/81 | HR 76 | Temp 98.3°F | Wt 156.0 lb

## 2014-08-15 DIAGNOSIS — J209 Acute bronchitis, unspecified: Secondary | ICD-10-CM

## 2014-08-15 DIAGNOSIS — R059 Cough, unspecified: Secondary | ICD-10-CM

## 2014-08-15 DIAGNOSIS — R05 Cough: Secondary | ICD-10-CM

## 2014-08-15 MED ORDER — AZITHROMYCIN 250 MG PO TABS: ORAL_TABLET | ORAL | Status: DC

## 2014-08-15 MED ORDER — ALBUTEROL SULFATE HFA 108 (90 BASE) MCG/ACT IN AERS: 2.0000 | INHALATION_SPRAY | Freq: Four times a day (QID) | RESPIRATORY_TRACT | Status: DC | PRN

## 2014-08-15 NOTE — Patient Instructions (Signed)
It was great seeing you today.   1. You likely have bronchitis but I will get a chest xray to evaluate for pneumonia.  2. Take Azithromycin: 2 pills on the first day, followed by 1 pill daily for the next 4 days 3. Use albuterol as needed for your cough 4. Continue taking Tylenol #3 and Tessalon pearls for cough as prescribed  If you have any questions or concerns before then, please call the clinic at [phone].  Take Care,   Dr Wenda LowJames Gaila Engebretsen

## 2014-08-15 NOTE — Progress Notes (Signed)
   Subjective:    Patient ID: Deborah Simon, female    DOB: 02/11/1951, 64 y.o.   MRN: [id number]  Seen for Same day visit for   CC: cough  COUGH  Has been coughing for 10 days. Cough is: worsening Sputum production: yellow Medications tried: Tylenol # 3 and Tessalon pearls w/o relief Taking blood pressure medications: yes  Symptoms Runny nose: no Mucous in back of throat: no Throat burning or reflux: no Wheezing or asthma: yes Fever: yes - subjective Chest Pain: no Shortness of breath: no Leg swelling: no Hemoptysis: no Weight loss: no  ROS see HPI Smoking Status noted  Recently seen in clinic and diagnosed with bronchitis and prescribed symptomatic treatment. Cough has persisted and sputum production is worsened.   Objective:  BP 143/81 mmHg  Pulse 76  Temp(Src) 98.3 F (36.8 C) (Oral)  Wt 156 lb (70.761 kg)  SpO2 97%  General: NAD Cardiac: RRR, normal heart sounds, no murmurs. 2+ radial and PT pulses bilaterally Respiratory: Mild diffuse wheeze; normal effort Abdomen: soft, nontender, nondistended, no hepatic or splenomegaly. Bowel sounds present Extremities: no edema or cyanosis. WWP. Skin: warm and dry, no rashes noted Neuro: alert and oriented, no focal deficits    Assessment & Plan:  See Problem List Documentation

## 2014-08-15 NOTE — Assessment & Plan Note (Signed)
Productive cough last > 10 days with mild wheeze. Other vitals wnl - Will obtain CXR to evaluate for PNA; low suspicion given VSS - Zpack for persistent cough likely bronchitis  - Refilled albuterol for cough/wheeze - continue symptom management

## 2014-08-16 ENCOUNTER — Ambulatory Visit: Payer: No Typology Code available for payment source | Admitting: Family Medicine

## 2014-08-18 ENCOUNTER — Encounter (HOSPITAL_COMMUNITY): Payer: Self-pay | Admitting: Emergency Medicine

## 2014-08-18 ENCOUNTER — Emergency Department (HOSPITAL_COMMUNITY)
Admission: EM | Admit: 2014-08-18 | Discharge: 2014-08-18 | Disposition: A | Discharge status: Home / Self Care | Payer: No Typology Code available for payment source | Attending: Emergency Medicine | Admitting: Emergency Medicine

## 2014-08-18 ENCOUNTER — Ambulatory Visit: Payer: No Typology Code available for payment source | Admitting: Family Medicine

## 2014-08-18 ENCOUNTER — Telehealth: Payer: Self-pay | Admitting: Family Medicine

## 2014-08-18 ENCOUNTER — Other Ambulatory Visit: Payer: Self-pay | Admitting: Family Medicine

## 2014-08-18 ENCOUNTER — Encounter: Payer: Self-pay | Admitting: *Deleted

## 2014-08-18 ENCOUNTER — Ambulatory Visit (INDEPENDENT_AMBULATORY_CARE_PROVIDER_SITE_OTHER): Payer: No Typology Code available for payment source | Admitting: Family Medicine

## 2014-08-18 DIAGNOSIS — E119 Type 2 diabetes mellitus without complications: Secondary | ICD-10-CM | POA: Diagnosis not present

## 2014-08-18 DIAGNOSIS — Z7982 Long term (current) use of aspirin: Secondary | ICD-10-CM | POA: Insufficient documentation

## 2014-08-18 DIAGNOSIS — T464X5A Adverse effect of angiotensin-converting-enzyme inhibitors, initial encounter: Secondary | ICD-10-CM | POA: Insufficient documentation

## 2014-08-18 DIAGNOSIS — Z7951 Long term (current) use of inhaled steroids: Secondary | ICD-10-CM | POA: Diagnosis not present

## 2014-08-18 DIAGNOSIS — R220 Localized swelling, mass and lump, head: Secondary | ICD-10-CM | POA: Diagnosis present

## 2014-08-18 DIAGNOSIS — I10 Essential (primary) hypertension: Secondary | ICD-10-CM | POA: Insufficient documentation

## 2014-08-18 DIAGNOSIS — Z79899 Other long term (current) drug therapy: Secondary | ICD-10-CM | POA: Insufficient documentation

## 2014-08-18 DIAGNOSIS — T783XXA Angioneurotic edema, initial encounter: Secondary | ICD-10-CM | POA: Insufficient documentation

## 2014-08-18 DIAGNOSIS — Z880 Allergy status to penicillin: Secondary | ICD-10-CM | POA: Diagnosis not present

## 2014-08-18 DIAGNOSIS — T783XXD Angioneurotic edema, subsequent encounter: Secondary | ICD-10-CM

## 2014-08-18 DIAGNOSIS — E785 Hyperlipidemia, unspecified: Secondary | ICD-10-CM | POA: Diagnosis not present

## 2014-08-18 MED ORDER — METHYLPREDNISOLONE SODIUM SUCC 125 MG IJ SOLR
125.0000 mg | Freq: Once | INTRAMUSCULAR | Status: AC
  Administered 2014-08-18: 125 mg via INTRAVENOUS
  Filled 2014-08-18: qty 2

## 2014-08-18 MED ORDER — EPINEPHRINE 0.3 MG/0.3ML IJ SOAJ: INTRAMUSCULAR | Status: DC

## 2014-08-18 MED ORDER — SODIUM CHLORIDE 0.9 % IV BOLUS (SEPSIS)
1000.0000 mL | Freq: Once | INTRAVENOUS | Status: AC
  Administered 2014-08-18: 1000 mL via INTRAVENOUS

## 2014-08-18 NOTE — Progress Notes (Signed)
I was available as preceptor to resident for this patient's office visit.  

## 2014-08-18 NOTE — Progress Notes (Signed)
   Subjective:    Patient ID: Deborah Simon, female    DOB: 03/21/1951, 64 y.o.   MRN: [id number]  HPI  Onset yesterday of lower lip non-painfulswelling Progressed to upper lip  Non-painful swelling today. Patient went to ED wherediagnosed with angioedema. Told to take benadry and stop Lisinopril ED told pt to follow up with PCP, so patient came right over from ED  Patient not having difficulty swallowing saliva or breathing. Patient also taking Azithromycin for a "cold"     Review of Systems  No fever No nausea/vomiting, no abdominal pain.     Objective:   Physical Exam SaO2 (RA) = 98%   Gen: no apparent distress, husband at her side. Able to speak in full sentences.  HEENT: upper and lower lips visibly edematous without lesions or skin breaks. No erythema.  No enlargement of tongue.  Posterior pharynx easily viewed without evidence of edema or narrowing. Ausculatation over larynx without inspiratory or expiratory wheezing Lungs: BCTA, no accessory muscle use,      Assessment & Plan:   Angioedema of lips - No threatening of airways - Last dose of Lisinopril was yesterday.  Pt's last dose of Azithromycin was today. - Recommendations: - Benadryl 25 mg ORAL until swelling of lips begins to subside. Her husband had brought a new box of Benadryl.  Pt took 25 mg orally in the office before discharge to community - Epipen 0.3 mg IM as needed if develop shortness of breath or difficulty handling her own secretions, then call 911, may repeat in 5 minutes if no response.  - [facility] to discuss other treatment options for her Hypertension.

## 2014-08-18 NOTE — ED Notes (Signed)
No swelling to pt's tongue, MD in room to discuss discharge instructions with this RN.

## 2014-08-18 NOTE — ED Notes (Signed)
Patient came home from work and ate some dinner, now with unilateral swelling to lips on the right side of the lower lip.  Patient denies any shortness of breath.  Patient denies any tongue swelling.  Patient does take Lisinopril.  Patient did take two pills of Benadryl around 8p.

## 2014-08-18 NOTE — ED Notes (Signed)
Pt verbalized understanding of d/c instructions and to follow up with Primary Dr to discuss change in BP meds. Pt has no further questions.

## 2014-08-18 NOTE — ED Provider Notes (Signed)
CSN: [id number]     Arrival date & time 08/18/14  0037 History  This chart was scribed for Marcoantonio Legault Alfonso Patten, MD by Rayfield Citizen, ED Scribe. This patient was seen in room D32C/D32C and the patient's care was started at 12:46 AM.    Chief Complaint  Patient presents with  . Angioedema   Patient is a 64 y.o. female presenting with allergic reaction. The history is provided by the patient. No language interpreter was used.  Allergic Reaction Presenting symptoms: swelling (Swelling of the right lower lip)   Presenting symptoms: no rash   Severity:  Moderate Prior allergic episodes:  Allergies to medications Context: medications  New detergent/soap: Lisinopril.   Relieved by:  Nothing Worsened by:  Nothing tried Ineffective treatments:  Antihistamines    HPI Comments: Deborah Simon is a 64 y.o. female with past medical history of HTN, HLD, DM who presents to the Emergency Department complaining of swelling to the right side of the lower lip. Patient explains that her lower lip began swelling around 16:30 this afternoon; she took two Benadryl around 20:00 without relief. She also notes recent mild cough.   Patient has been taking lisinopril "for years." Last dose this morning.   Past Medical History  Diagnosis Date  . Hypertension   . Diabetes mellitus   . Hyperlipidemia    Past Surgical History  Procedure Laterality Date  . Cholecystectomy  10/07/11    Single site   Family History  Problem Relation Age of Onset  . Stroke Mother   . Stroke Father    History  Substance Use Topics  . Smoking status: Never Smoker   . Smokeless tobacco: Never Used  . Alcohol Use: No   OB History    No data available     Review of Systems  HENT: Positive for facial swelling.   Skin: Negative for rash.  All other systems reviewed and are negative.  Allergies  Omeprazole and Penicillins  Home Medications   Prior to Admission medications   Medication Sig Start Date End Date Taking?  Authorizing Provider  acetaminophen-codeine (TYLENOL #3) 300-30 MG per tablet Take 1-2 tablets by mouth every 6 (six) hours as needed (cough). 08/10/14   Tamela Oddi Hess, DO  albuterol (PROVENTIL HFA;VENTOLIN HFA) 108 (90 BASE) MCG/ACT inhaler Inhale 2 puffs into the lungs every 6 (six) hours as needed for wheezing. 08/15/14 08/15/15  Olam Idler, MD  aspirin EC 81 MG tablet Take 81 mg by mouth daily.    Historical Provider, MD  atorvastatin (LIPITOR) 40 MG tablet Take 1 tablet (40 mg total) by mouth daily. 11/16/13   Willeen Niece, MD  azithromycin (ZITHROMAX Z-PAK) 250 MG tablet Take 2 pills on day 1; then take 1 pill daily for the next 4 days. 08/15/14   Olam Idler, MD  benzonatate (TESSALON) 200 MG capsule Take 1 capsule (200 mg total) by mouth 3 (three) times daily as needed for cough. 08/10/14   Tamela Oddi Hess, DO  Blood Glucose Monitoring Suppl (FREESTYLE FREEDOM LITE) W/DEVICE KIT 1 Units by Does not apply route once. Use to test blood glucose once a day 03/17/14   Willeen Niece, MD  cetirizine (ZYRTEC) 10 MG tablet Take 10 mg by mouth daily.    Historical Provider, MD  cyanocobalamin 500 MCG tablet Take 2,500 mcg by mouth daily.     Historical Provider, MD  docusate sodium (COLACE) 100 MG capsule Take 100 mg by mouth 2 (two) times daily as  needed for mild constipation.    Historical Provider, MD  FIBER DIET TABS Take by mouth.    Historical Provider, MD  fluticasone (FLOVENT HFA) 110 MCG/ACT inhaler Inhale 2 puffs into the lungs 2 (two) times daily. 08/19/12   Hilton Sinclair, MD  lisinopril-hydrochlorothiazide (PRINZIDE,ZESTORETIC) 20-12.5 MG per tablet Take 2 tablets by mouth daily. 03/31/14   Hilton Sinclair, MD  lisinopril-hydrochlorothiazide (ZESTORETIC) 20-12.5 MG per tablet Take 2 tablets by mouth daily. Needs MD appt. 12/27/13   Hilton Sinclair, MD  metFORMIN (GLUCOPHAGE) 500 MG tablet Take 1 tablet (500 mg total) by mouth 2 (two) times daily with a meal.    Hilton Sinclair,  MD  metoprolol succinate (TOPROL-XL) 25 MG 24 hr tablet Take 1 tablet (25 mg total) by mouth daily. 02/12/14   Hilton Sinclair, MD  mometasone (NASONEX) 50 MCG/ACT nasal spray Place 2 sprays into the nose daily as needed.    Historical Provider, MD  Multiple Vitamin (MULTIVITAMIN WITH MINERALS) TABS Take 1 tablet by mouth daily.    Historical Provider, MD  Potassium 99 MG TABS Take 595 mg by mouth.     Historical Provider, MD  ranitidine (ZANTAC) 150 MG capsule Take 1 capsule (150 mg total) by mouth 2 (two) times daily. 08/04/12 08/04/13  Hilton Sinclair, MD  ranitidine (ZANTAC) 150 MG tablet TAKE ONE TABLET BY MOUTH TWICE DAILY 03/06/14   Hilton Sinclair, MD   BP 136/72 mmHg  Pulse 81  Temp(Src) 98.9 F (37.2 C) (Oral)  Resp 18  Ht _0  (1.651 m)  Wt 146 lb (66.225 kg)  BMI 24.30 kg/m2  SpO2 100% Physical Exam  Constitutional: She is oriented to person, place, and time. She appears well-developed and well-nourished. No distress.  HENT:  Head: Normocephalic and atraumatic.  Mouth/Throat: Oropharynx is clear and moist. No oropharyngeal exudate.  No swelling of the tongue or uvula  Eyes: EOM are normal. Pupils are equal, round, and reactive to light.  Neck: Normal range of motion. Neck supple. No JVD present.  Cardiovascular: Normal rate, regular rhythm and normal heart sounds.  Exam reveals no gallop and no friction rub.   No murmur heard. Pulmonary/Chest: Effort normal and breath sounds normal. No respiratory distress. She has no wheezes. She has no rales.  No stridor  Abdominal: Soft. Bowel sounds are normal. She exhibits no mass. There is no tenderness. There is no rebound and no guarding.  Musculoskeletal: Normal range of motion. She exhibits no edema.  Moves all extremities normally.   Lymphadenopathy:    She has no cervical adenopathy.  Neurological: She is alert and oriented to person, place, and time. She displays normal reflexes.  Skin: Skin is warm and dry. No  rash noted.  Psychiatric: She has a normal mood and affect. Her behavior is normal.  Nursing note and vitals reviewed.   ED Course  Procedures   DIAGNOSTIC STUDIES: Oxygen Saturation is 97% on RA, adequate by my interpretation.    COORDINATION OF CARE: 12:50 AM Discussed treatment plan with pt at bedside and pt agreed to plan.   Labs Review Labs Reviewed - No data to display  Imaging Review No results found.   EKG Interpretation None      MDM   Final diagnoses:  None  airway intact no progression in the ED.   Ace- Inhibitor angioedema.  There is no proven benefit to steroids.  List lisinopril as an allergy.  Call your PMD in the am for  recheck and change of medication.    I personally performed the services described in this documentation, which was scribed in my presence. The recorded information has been reviewed and is accurate.       Carlisle Beers, MD 08/18/14 [phone]

## 2014-08-18 NOTE — Progress Notes (Signed)
   Pt in nurse clinic with spouse stating she went to ER for allergic reaction to lisinopril.  Pt's mouth were swollen, pt stated she did have a little trouble breathing.  Precept with Dr. McDiarmid. Will forward to Dr. McDiarmid.  Clovis PuMartin, Hera Celaya L, RN

## 2014-08-18 NOTE — ED Notes (Signed)
Pt able to swallow water from cup with no difficulty.

## 2014-08-18 NOTE — Telephone Encounter (Signed)
Pt asking to speak with someone about getting her insulin, says it is very expensive and she cannot afford it

## 2014-08-18 NOTE — Discharge Instructions (Signed)
Angioedema °Angioedema is sudden puffiness (swelling), often of the skin. It can happen: °· On your face or privates (genitals). °· In your belly (abdomen) or other body parts. °It usually happens quickly and gets better in 1 or 2 days. It often starts at night and is found when you wake up. You may get red, itchy patches of skin (hives). Attacks can be dangerous if your breathing passages get puffy. °The condition may happen only once, or it can come back at random times. It may happen for several years before it goes away for good. °HOME CARE °· Only take medicines as told by your doctor. °· Always carry your emergency allergy medicines with you. °· Wear a medical bracelet as told by your doctor. °· Avoid things that you know will cause attacks (triggers). °GET HELP IF: °· You have another attack. °· Your attacks happen more often or get worse. °· The condition was passed to you by your parents and you want to have children. °GET HELP RIGHT AWAY IF:  °· Your mouth, tongue, or lips are very puffy. °· You have trouble breathing. °· You have trouble swallowing. °· You pass out (faint). °MAKE SURE YOU:  °· Understand these instructions. °· Will watch your condition. °· Will get help right away if you are not doing well or get worse. °Document Released: 05/21/2009 Document Revised: 03/23/2013 Document Reviewed: 01/24/2013 °ExitCare® Patient Information ©2015 [employer]. This information is not intended to replace advice given to you by your health care provider. Make sure you discuss any questions you have with your health care provider. ° °

## 2014-08-21 ENCOUNTER — Ambulatory Visit: Payer: No Typology Code available for payment source | Admitting: Family Medicine

## 2014-08-21 MED ORDER — HYDROCHLOROTHIAZIDE 25 MG PO TABS: 25.0000 mg | ORAL_TABLET | Freq: Every day | ORAL | Status: DC

## 2014-08-21 NOTE — Telephone Encounter (Signed)
Will forward to MD. Jazmin Hartsell,CMA  

## 2014-08-21 NOTE — Telephone Encounter (Signed)
She was taken off her blood pressure pills (lisinopril) because they made her face swell. She was coming in to see dr t today but the appt had to be rescheduled because Dr T called out Her appt is march 17. Please advise

## 2014-08-21 NOTE — Telephone Encounter (Signed)
Blue Team, please call and let her know I decided that a good course of action would be to restart just HCTZ x 1-2 months (called in rx for 1 tab daily #60) and follow up in 2-4 weeks with me to see how she is doing. At that time, we can discuss starting an ARB which has some risk of swelling but is rare and would work like the lisinopril to protect her heart and kidneys with diabetes. We will discuss further in our visit. Please also let her know angioedema from ACE can recur 1-2 months after initial event so if this occurs, return to ED for re-evaluation.    Documentation -   Called to apologize for having to call out sick today and to  discuss options with her.  She presented to the ED 3/4 with angioedema and lisinopril-HCTZ was discontinued. She denies ever having trouble breathing, and angioedema of jaws, face, and eyes resolved and has not recurred. She could consider wearing medical alert bracelet stating angioedema to ACE.  She states home BPs have been <150/70 (goal for age, not for DM status).   She states she has not had issue with insulin (is not on this) that was mentioned in first phone note here.   Deborah SingletonMaria T Oreta Soloway, MD

## 2014-08-22 NOTE — Telephone Encounter (Signed)
LM for patient to call back. Jazmin Hartsell,CMA  

## 2014-08-25 ENCOUNTER — Telehealth: Payer: Self-pay | Admitting: Family Medicine

## 2014-08-25 NOTE — Telephone Encounter (Signed)
[name] called to say since she's been on the hctz her numbers are reading in the 160 systolic.  Wanted to know if she should take her med twice daily instead of once.  Please advise.

## 2014-08-27 NOTE — Telephone Encounter (Signed)
Left message on voicemail that she could increase HCTZ 25mg  from once to twice daily (50mg  total) and hold for dizziness, drops in BP <130/80, or other side effects and otherwise to keep track of BPs and f/u with me in 1-2 weeks to discuss.  Deborah SingletonMaria T Kaylise Blakeley, MD

## 2014-08-31 ENCOUNTER — Encounter: Payer: Self-pay | Admitting: Family Medicine

## 2014-08-31 ENCOUNTER — Ambulatory Visit (INDEPENDENT_AMBULATORY_CARE_PROVIDER_SITE_OTHER): Payer: No Typology Code available for payment source | Admitting: Family Medicine

## 2014-08-31 VITALS — BP 150/77 | HR 88 | Temp 98.5°F | Ht 65.0 in | Wt 157.0 lb

## 2014-08-31 DIAGNOSIS — J4530 Mild persistent asthma, uncomplicated: Secondary | ICD-10-CM

## 2014-08-31 DIAGNOSIS — T783XXD Angioneurotic edema, subsequent encounter: Secondary | ICD-10-CM

## 2014-08-31 DIAGNOSIS — I10 Essential (primary) hypertension: Secondary | ICD-10-CM

## 2014-08-31 DIAGNOSIS — R05 Cough: Secondary | ICD-10-CM

## 2014-08-31 DIAGNOSIS — E118 Type 2 diabetes mellitus with unspecified complications: Secondary | ICD-10-CM

## 2014-08-31 DIAGNOSIS — E785 Hyperlipidemia, unspecified: Secondary | ICD-10-CM

## 2014-08-31 DIAGNOSIS — R059 Cough, unspecified: Secondary | ICD-10-CM

## 2014-08-31 MED ORDER — LOSARTAN POTASSIUM 50 MG PO TABS
50.0000 mg | ORAL_TABLET | Freq: Every day | ORAL | Status: DC
Start: 2014-08-31 — End: 2014-09-01

## 2014-08-31 MED ORDER — METFORMIN HCL 500 MG PO TABS: 500.0000 mg | ORAL_TABLET | Freq: Two times a day (BID) | ORAL | Status: DC

## 2014-08-31 MED ORDER — BENZONATATE 200 MG PO CAPS: 200.0000 mg | ORAL_CAPSULE | Freq: Three times a day (TID) | ORAL | Status: DC | PRN

## 2014-08-31 MED ORDER — FLUTICASONE PROPIONATE HFA 110 MCG/ACT IN AERO: 2.0000 | INHALATION_SPRAY | Freq: Two times a day (BID) | RESPIRATORY_TRACT | Status: DC

## 2014-08-31 MED ORDER — ATORVASTATIN CALCIUM 40 MG PO TABS: 40.0000 mg | ORAL_TABLET | Freq: Every day | ORAL | Status: DC

## 2014-08-31 NOTE — Progress Notes (Signed)
Patient ID: Deborah Simon, female   DOB: 12/29/1950, 64 y.o.   MRN: [id number] Subjective:   CC:  Follow-up facial swelling, cough  HPI:   Follow-up facial swelling Patient was seen in the emergency room early March due to  Right lower lipswelling. It was presumed due to lisinopril, but she had also recently started azithromycin. This has since completely resolved and she has stopped azithromycin and lisinopril. She has not had any issues or difficulty with breathing or throat swelling since that time.   Hypertension follow up Due to stopping  Lisinopril, her blood pressure has not been well controlled. We have increased HCTZ and she continue metoprolol but has still been in the 150s/90s consistently. She does not report any chest pain or dyspnea.  Cough She also reports a persistent cough since early March. It has not improved and she has mild clearish sputum. She denies any chest pain, fevers, difficulty breathing, or difficulty maintaining hydration. Congestion and headache have improved. She denies any unintentional weight loss. She has a history of asthma and has been using albuterol nightly. She is not using the Flovent that is on her med list and does not seem to know anything about it.  Review of Systems - Per HPI.   PMH:  Allergic rhinitis, mild persistent asthma, chronic back pain, diabetes type 2, GERD, hyperlipidemia, hypertension, obesity    Objective:  Physical Exam BP 150/77 mmHg  Pulse 88  Temp(Src) 98.5 F (36.9 C) (Oral)  Ht 5\' 5"  (1.651 m)  Wt 157 lb (71.215 kg)  BMI 26.13 kg/m2 GEN: NAD CV: RRR, no m/r/g PULM: CTAB< normal effort HEENT: O/p clear, neck supple EXTR: No LE edema or calf tenderness    Assessment:     Deborah Simon is a 64 y.o. female here for cough, HTN follow up, and f/u of lip angioedema.    Plan:     # See problem list and after visit summary for problem-specific plans. - Diabetes: Blood sugars reviewed, well controlled. F/u with me when  available to fully discuss.   Follow-up: Follow up in 2-3 weeks for f/u of cough and HTN.     Leona SingletonMaria T Klaus Casteneda, MD [facility]

## 2014-08-31 NOTE — Patient Instructions (Addendum)
Good to see you today. Your lungs sound clear and your throat does not look infected. I think this cough is left over from a recent pneumonia.  Be sure to use the Flovent inhaler daily. I have prescribed Tessalon Perles for your cough as well. Follow-up in 2-4 weeks if not improved completely.  For your blood pressure, start taking losartan daily (start with 1/2 tab, increase in 1-2 weeks to 1 tab unless BP consistently 120-140/90).  Continue checking home blood pressures. Follow-up with me in 2-3 weeks.  I am glad your blood sugars are well controlled. Be sure to follow-up with me at some point to fully discuss your diabetes.  Best,  Leona SingletonMaria T Gorge Almanza, MD

## 2014-08-31 NOTE — Assessment & Plan Note (Signed)
Angioedema March 4, presumed from lisinopril. This was discontinued along with azithromycin.  -Added azithromycin to allergies list is well - EpiPen was prescribed, the patient's insurance will not cover. Asked her to call insurance to inquire about this.

## 2014-08-31 NOTE — Telephone Encounter (Signed)
Pt is in clinic today. Jamice Carreno,CMA

## 2014-08-31 NOTE — Assessment & Plan Note (Signed)
Hypertension, poorly controlled above goal 130/94 in diabetic patient. Lisinopril recently stopped due to angioedema. Inadequate control even with increase in HCTZ.  -Continue HCTZ and metoprolol , add losartan starting with 25 mg and increasing after 1-2 weeks to 50 mg daily. - continue  checking blood pressures at home  - follow up in 2-3 weeks.

## 2014-08-31 NOTE — Assessment & Plan Note (Signed)
Continued cough, no other symptoms. Afebrile. Normal respiratory status. Lungs clear on exam. Likely continued irritation from recent pna. - Flovent inhaler daily encouraged and represcribed.  - F/u 2-4 weeks if not imprved. - Can take robitussin DM for cough. Also rx'ed tessalon perles.

## 2014-09-01 ENCOUNTER — Other Ambulatory Visit: Payer: Self-pay | Admitting: Family Medicine

## 2014-09-01 DIAGNOSIS — I10 Essential (primary) hypertension: Secondary | ICD-10-CM

## 2014-09-01 MED ORDER — LOSARTAN POTASSIUM 50 MG PO TABS: 50.0000 mg | ORAL_TABLET | Freq: Every day | ORAL | Status: DC

## 2014-09-01 NOTE — Telephone Encounter (Signed)
Pt called and needs her [facility]artan [facility]  [address] since it will take to many days before she gets this through the mail. jw

## 2014-09-04 ENCOUNTER — Telehealth: Payer: Self-pay | Admitting: Family Medicine

## 2014-09-04 ENCOUNTER — Other Ambulatory Visit: Payer: Self-pay | Admitting: *Deleted

## 2014-09-04 DIAGNOSIS — I10 Essential (primary) hypertension: Secondary | ICD-10-CM

## 2014-09-04 MED ORDER — HYDROCHLOROTHIAZIDE 25 MG PO TABS: 25.0000 mg | ORAL_TABLET | Freq: Every day | ORAL | Status: DC

## 2014-09-04 MED ORDER — LOSARTAN POTASSIUM 50 MG PO TABS: 50.0000 mg | ORAL_TABLET | Freq: Every day | ORAL | Status: DC

## 2014-09-04 NOTE — Telephone Encounter (Signed)
Pt needs blood pressure medicine, [facility]othiazide[facility], would like to know if Losartan can be sent [facility] but if too expensive then she will need something else/ Deborah Simon, ASA

## 2014-09-04 NOTE — Telephone Encounter (Signed)
I have resent these both [facility].  Deborah SingletonMaria T Toryn Dewalt, MD

## 2014-09-11 ENCOUNTER — Telehealth: Payer: Self-pay | Admitting: *Deleted

## 2014-09-11 DIAGNOSIS — J4541 Moderate persistent asthma with (acute) exacerbation: Secondary | ICD-10-CM

## 2014-09-11 MED ORDER — BECLOMETHASONE DIPROPIONATE 80 MCG/ACT IN AERS: 1.0000 | INHALATION_SPRAY | Freq: Two times a day (BID) | RESPIRATORY_TRACT | Status: DC

## 2014-09-11 NOTE — Telephone Encounter (Signed)
L/M stating to call office back but stated to use inhaler 1 puff twice daily as directed below. Zakari Bathe, CMA.

## 2014-09-11 NOTE — Telephone Encounter (Signed)
Received a call from [facility] stating pt's insurance will not cover Flovent unless pt has tried and failed the preferred medication Qvar.  Per Dr. Benjamin Stainhekkekandam; pt has not tried and failed Qvar.  Verbal order given to change to Qvar 80 mcg 2 puffs 2 times daily; 90 day supply given; 4 refills.  Left voice message for pt informing her of medication change.  Clovis PuMartin, Amr Sturtevant L, RN

## 2014-09-11 NOTE — Telephone Encounter (Signed)
Please let her know to actually start at 1 puff twice daily and if needed we can increase to the 2 puffs twice daily. She should follow up with me in 1 month or so to see how this is going.  Deborah SingletonMaria T Laron Angelini, MD

## 2014-09-11 NOTE — Addendum Note (Signed)
Addended by: Simone CuriaHEKKEKANDAM, Evonda Enge T on: 09/11/2014 12:32 PM   Modules accepted: Orders, Medications

## 2014-09-13 NOTE — Telephone Encounter (Signed)
Patient aware of advice below. Please refer to new msg from 09/13/14. Deborah Simon, CMA.

## 2014-09-13 NOTE — Telephone Encounter (Signed)
Tried calling pt again and L/M stating as directed below and to call office for any questions or concerns. Gaither Biehn, CMA.

## 2014-09-13 NOTE — Telephone Encounter (Signed)
Will forward to PCP for review. She has FU appt with you on 09/27/14.  Tamim Skog, CMA

## 2014-09-13 NOTE — Telephone Encounter (Signed)
Thank you for letting me know.  Deborah SingletonMaria T Nekesha Font, MD

## 2014-09-13 NOTE — Telephone Encounter (Signed)
Deborah Simon called back to say she's doing very well on her bp medicine and doesn't need to have any new inhalers.  She will keep her appt.

## 2014-09-27 ENCOUNTER — Ambulatory Visit (INDEPENDENT_AMBULATORY_CARE_PROVIDER_SITE_OTHER): Payer: No Typology Code available for payment source | Admitting: Family Medicine

## 2014-09-27 ENCOUNTER — Encounter: Payer: Self-pay | Admitting: Family Medicine

## 2014-09-27 VITALS — BP 134/70 | HR 85 | Temp 98.3°F | Ht 62.0 in | Wt 159.9 lb

## 2014-09-27 DIAGNOSIS — E785 Hyperlipidemia, unspecified: Secondary | ICD-10-CM | POA: Diagnosis not present

## 2014-09-27 DIAGNOSIS — I10 Essential (primary) hypertension: Secondary | ICD-10-CM | POA: Diagnosis not present

## 2014-09-27 DIAGNOSIS — E119 Type 2 diabetes mellitus without complications: Secondary | ICD-10-CM

## 2014-09-27 DIAGNOSIS — J4530 Mild persistent asthma, uncomplicated: Secondary | ICD-10-CM

## 2014-09-27 DIAGNOSIS — E118 Type 2 diabetes mellitus with unspecified complications: Secondary | ICD-10-CM

## 2014-09-27 LAB — COMPREHENSIVE METABOLIC PANEL
ALBUMIN: 4.7 g/dL (ref 3.5–5.2)
ALK PHOS: 75 U/L (ref 39–117)
ALT: 20 U/L (ref 0–35)
AST: 21 U/L (ref 0–37)
BILIRUBIN TOTAL: 0.3 mg/dL (ref 0.2–1.2)
BUN: 10 mg/dL (ref 6–23)
CO2: 28 mEq/L (ref 19–32)
CREATININE: 0.81 mg/dL (ref 0.50–1.10)
Calcium: 9.9 mg/dL (ref 8.4–10.5)
Chloride: 103 mEq/L (ref 96–112)
GLUCOSE: 136 mg/dL — AB (ref 70–99)
POTASSIUM: 3.4 meq/L — AB (ref 3.5–5.3)
Sodium: 138 mEq/L (ref 135–145)
Total Protein: 7 g/dL (ref 6.0–8.3)

## 2014-09-27 LAB — POCT GLYCOSYLATED HEMOGLOBIN (HGB A1C): HEMOGLOBIN A1C: 6.5

## 2014-09-27 MED ORDER — ATORVASTATIN CALCIUM 40 MG PO TABS: 40.0000 mg | ORAL_TABLET | Freq: Every day | ORAL | Status: DC

## 2014-09-27 MED ORDER — METFORMIN HCL 500 MG PO TABS: 500.0000 mg | ORAL_TABLET | Freq: Two times a day (BID) | ORAL | Status: DC

## 2014-09-27 NOTE — Assessment & Plan Note (Signed)
Blood pressure relatively well controlled today and at home. -Continue current regimen. -We will check renal function today given start of losartan the last visit. -Follow-up in 3 months or sooner if blood pressure is remaining over 130/90 regularly. -Continue exercising 30 minutes to one hour at least 3 days per week and keeping salt intake to minimum.

## 2014-09-27 NOTE — Assessment & Plan Note (Signed)
A1c not checked in some time but has typically been well controlled <7. Renal function normal in September. Taking aspirin and Lipitor daily. -Overdue for diabetic eye exam. Asked patient to check with her insurance about cost as she was concerned about this. Let me know if very expensive and we can consider other options. -Asked patient to avoid major exfoliation of her heels and check feet regularly. -A1c today. Continue metformin daily and A1c check in another 3-6 months. -Will be due for lipid panel again around September. Renal function checked today. -Refilled metformin and atorvastatin.

## 2014-09-27 NOTE — Patient Instructions (Signed)
Good to see you.  Your blood pressure looks good today. Continue taking the medications you were taking at this time. Continue exercising at least 3 days a week. We will need to recheck your kidney function and cholesterol in September.  For your breathing, continue current medications including allergy medicine and albuterol as needed. If you start needing the albuterol more than twice per week, let me know and we will need to reassess a controller inhaler. For your diabetes we are checking an A1c today. Please call your insurance to find out about the diabetic eye exam and if this will be affordable. This is an important preventative exam for diabetics every year.  I am refilling your atorvastatin and metformin today. I will call you with A1c results.  Leona SingletonMaria T Nesta Kimple, MD

## 2014-09-27 NOTE — Assessment & Plan Note (Signed)
Breathing much improved. Patient takes allergy medicines regularly and has well-controlled asthma. Has not needed albuterol more than once weekly in the past multiple weeks. -Continue albuterol when necessary. Notify me if needing more than 2 times a week and we will reevaluate chronic inhaled corticosteroid. -None at this time due to cost.

## 2014-09-27 NOTE — Progress Notes (Signed)
Patient ID: Deborah Simon, female   DOB: 01/19/1951, 64 y.o.   MRN: [id number] Subjective:   CC: Follow-up hypertension, DM, and breathing  HPI:   Follow-up hypertension At patient's last visit, hypertension was above goal of 130/90 in diabetic patient. Lisinopril had been stopped due to angioedema but inadequate control even with increase of HCTZ and continuing metoprolol. Added losartan 25 mg daily. She has been checking home blood pressures which have been 120s to 130s over 70s. She denies side effects of new medications. She has been compliant with doses. She denies headaches, blurry vision, chest pain, shortness of breath, or any appreciable leg swelling. She does not add any salt to her cooking and has been exercising 3 days weekly at the gym.  Follow-up diabetes Patient has been taking her metformin regularly. She had normal renal function in September. She has not had in diabetic eye exam this year. She takes aspirin and Lipitor daily. She checks her feet and has had no symptoms or skin breakdown. She does occasionally use some kind of tool to smooth out the bottom of her feet. No symptoms of hypo-or hyperglycemia. No chest pain or shortness of breath. Blood sugar checked at home was 69.  Follow-up breathing Last visit, patient had continued cough but no other symptoms and was afebrile. Lungs were clear. Thought it was due to irritation of recent pneumonia. Encouraged her to use her daily Flovent but due to cost this was changed to Qvar. Patient was unable to pick this up due to still costing $300 even with insurance coverage. Since that time, she has only been using her albuterol 0-1 times per week. She is using allergy medication and states her symptoms are very well controlled. She does not want to be on a controller at this time. She has not had any fevers or chills or other complaints.  Review of Systems - Per HPI.   PMH - Allergic rhinitis, mild persistent asthma, chronic back pain,  diabetes type 2, GERD, hyperlipidemia, hypertension, obesity Smoking status: Never smoker    Objective:  Physical Exam BP 134/70 mmHg  Pulse 85  Temp(Src) 98.3 F (36.8 C) (Oral)  Ht 5\' 2"  (1.575 m)  Wt 159 lb 14.4 oz (72.53 kg)  BMI 29.24 kg/m2 GEN: NAD Cardiovascular: Regular rate and rhythm, no murmurs rubs or gallops Pulmonary: Clear to auscultation bilaterally, normal effort Abdomen: Soft, nontender, nondistended Extremities: No lower extremity edema or calf tenderness Diabetic foot exam: Normal with no deformity, erythema, or swelling, 2+ bilateral dorsalis pedis pulses, monofilament testing relatively normal bilaterally but with diminished sensation under the third metatarsal pad and heel bilaterally, no skin breakdown.    Assessment:     Deborah BrunetteBeverly Thiemann is a 64 y.o. female here for follow-up of hypertension, diabetes, and breathing    Plan:     # See problem list and after visit summary for problem-specific plans.   # Health Maintenance: Not discussed  Follow-up: Follow up in 3 months for follow-up of hypertension.     Deborah SingletonMaria T Gianni Fuchs, MD [facility]

## 2014-09-28 ENCOUNTER — Telehealth: Payer: Self-pay | Admitting: Family Medicine

## 2014-09-28 NOTE — Telephone Encounter (Signed)
Called to let Ms Deborah Simon know that her renal function was normal and A1c was unchanged and still well-controlled. Her potassium was slightly low so we discussed eating foods high in potassium every few days. She voiced understanding.  Deborah SingletonMaria T Kail Fraley, MD

## 2014-10-13 ENCOUNTER — Telehealth: Payer: Self-pay | Admitting: Family Medicine

## 2014-10-13 NOTE — Telephone Encounter (Signed)
Will forward to MD for medical necessity letter for her diabetic eye exam. Jazmin Hartsell,CMA

## 2014-10-13 NOTE — Telephone Encounter (Addendum)
[name] called to say that her insurance co, [employer], is requesting a statement from provider that patient need to have another eye exam to have visit covered.  Their fax # is [phone].

## 2014-10-15 ENCOUNTER — Other Ambulatory Visit: Payer: Self-pay | Admitting: Family Medicine

## 2014-10-19 ENCOUNTER — Encounter: Payer: Self-pay | Admitting: Family Medicine

## 2014-10-19 NOTE — Telephone Encounter (Signed)
I have created and placed letter in fax bin.  Deborah SingletonMaria T Fairy Ashlock, MD

## 2014-10-30 ENCOUNTER — Telehealth: Payer: Self-pay | Admitting: Family Medicine

## 2014-10-30 DIAGNOSIS — I10 Essential (primary) hypertension: Secondary | ICD-10-CM

## 2014-10-30 MED ORDER — LOSARTAN POTASSIUM 50 MG PO TABS: 50.0000 mg | ORAL_TABLET | Freq: Every day | ORAL | Status: DC

## 2014-10-30 MED ORDER — HYDROCHLOROTHIAZIDE 25 MG PO TABS: 25.0000 mg | ORAL_TABLET | Freq: Two times a day (BID) | ORAL | Status: DC

## 2014-10-30 NOTE — Telephone Encounter (Signed)
Done. [facility]  Deborah SingletonMaria T Tivon Lemoine, MD

## 2014-10-30 NOTE — Telephone Encounter (Signed)
Refill request. Will forward to PCP for review. Quadry Kampa, CMA. 

## 2014-10-30 NOTE — Telephone Encounter (Signed)
Needs refill on HCTZ Is taking it [facility] Also needs to fax the new rx to [facility]

## 2014-12-04 ENCOUNTER — Other Ambulatory Visit: Payer: Self-pay | Admitting: Family Medicine

## 2014-12-04 DIAGNOSIS — I10 Essential (primary) hypertension: Secondary | ICD-10-CM

## 2014-12-04 NOTE — Telephone Encounter (Signed)
Pt called and needs a refill sent to her [facility] pharmacy [facility] for her Hydrochlorothiazide . jw

## 2014-12-06 MED ORDER — HYDROCHLOROTHIAZIDE 25 MG PO TABS: 25.0000 mg | ORAL_TABLET | Freq: Two times a day (BID) | ORAL | Status: DC

## 2014-12-06 NOTE — Telephone Encounter (Signed)
Sent. [facility] (see phone note). Leona Singleton, MD

## 2014-12-22 LAB — HM DIABETES EYE EXAM

## 2015-01-08 ENCOUNTER — Encounter: Payer: Self-pay | Admitting: Gastroenterology

## 2015-01-10 ENCOUNTER — Telehealth: Payer: Self-pay | Admitting: Family Medicine

## 2015-01-10 NOTE — Telephone Encounter (Signed)
Patient calling and states she needs a refill on [facility]oxicam [facility] [phone]. Thank you, Dorothey Baseman, ASA

## 2015-01-10 NOTE — Telephone Encounter (Signed)
We have never prescribed meloxicam to her.  Please verify medication.  If she is needing this medication, she will have to be seen.

## 2015-01-13 ENCOUNTER — Other Ambulatory Visit: Payer: Self-pay | Admitting: Family Medicine

## 2015-02-19 ENCOUNTER — Other Ambulatory Visit: Payer: Self-pay | Admitting: Family Medicine

## 2015-02-28 ENCOUNTER — Telehealth: Payer: Self-pay | Admitting: Family Medicine

## 2015-02-28 NOTE — Telephone Encounter (Signed)
Pt called because she needs a refill on her Metoprolol called in. She has scheduled an appointment for 04/13/15 to meet her new doctor.  She can only come in on Fridays and this is the first Friday that Dr. Nadine Counts has. jw

## 2015-03-02 ENCOUNTER — Other Ambulatory Visit: Payer: Self-pay | Admitting: Family Medicine

## 2015-03-02 DIAGNOSIS — I10 Essential (primary) hypertension: Secondary | ICD-10-CM

## 2015-03-02 MED ORDER — METOPROLOL SUCCINATE ER 25 MG PO TB24: 25.0000 mg | ORAL_TABLET | Freq: Every day | ORAL | Status: DC

## 2015-03-02 NOTE — Telephone Encounter (Signed)
Informed pt of the below. Sunday Spillers, CMA

## 2015-03-02 NOTE — Telephone Encounter (Signed)
Done.  Sent in for 3 month supply.

## 2015-03-13 ENCOUNTER — Telehealth: Payer: Self-pay | Admitting: Family Medicine

## 2015-03-13 ENCOUNTER — Other Ambulatory Visit: Payer: Self-pay | Admitting: Family Medicine

## 2015-03-13 DIAGNOSIS — K219 Gastro-esophageal reflux disease without esophagitis: Secondary | ICD-10-CM

## 2015-03-13 MED ORDER — RANITIDINE HCL 150 MG PO TABS: ORAL_TABLET | ORAL | Status: DC

## 2015-03-13 NOTE — Telephone Encounter (Signed)
Needs refill on randidine [facility]

## 2015-03-13 NOTE — Telephone Encounter (Signed)
Pt informed Rx was refilled. Sunday Spillers, CMA

## 2015-03-13 NOTE — Telephone Encounter (Signed)
Completed.

## 2015-04-13 ENCOUNTER — Ambulatory Visit: Payer: No Typology Code available for payment source | Admitting: Family Medicine

## 2015-04-17 ENCOUNTER — Encounter: Payer: Self-pay | Admitting: Family Medicine

## 2015-04-17 ENCOUNTER — Ambulatory Visit (INDEPENDENT_AMBULATORY_CARE_PROVIDER_SITE_OTHER): Payer: No Typology Code available for payment source | Admitting: Family Medicine

## 2015-04-17 VITALS — BP 142/71 | HR 72 | Temp 98.3°F | Ht 62.0 in | Wt 164.1 lb

## 2015-04-17 DIAGNOSIS — G8929 Other chronic pain: Secondary | ICD-10-CM

## 2015-04-17 DIAGNOSIS — Z1159 Encounter for screening for other viral diseases: Secondary | ICD-10-CM

## 2015-04-17 DIAGNOSIS — I10 Essential (primary) hypertension: Secondary | ICD-10-CM

## 2015-04-17 DIAGNOSIS — E119 Type 2 diabetes mellitus without complications: Secondary | ICD-10-CM

## 2015-04-17 DIAGNOSIS — M545 Low back pain: Secondary | ICD-10-CM

## 2015-04-17 DIAGNOSIS — Z114 Encounter for screening for human immunodeficiency virus [HIV]: Secondary | ICD-10-CM

## 2015-04-17 LAB — POCT GLYCOSYLATED HEMOGLOBIN (HGB A1C): Hemoglobin A1C: 6.4

## 2015-04-17 MED ORDER — HYDROCHLOROTHIAZIDE 25 MG PO TABS: 25.0000 mg | ORAL_TABLET | Freq: Two times a day (BID) | ORAL | Status: DC

## 2015-04-17 MED ORDER — METOPROLOL SUCCINATE ER 25 MG PO TB24: 25.0000 mg | ORAL_TABLET | Freq: Every day | ORAL | Status: DC

## 2015-04-17 MED ORDER — LOSARTAN POTASSIUM 50 MG PO TABS: 50.0000 mg | ORAL_TABLET | Freq: Every day | ORAL | Status: DC

## 2015-04-17 MED ORDER — MELOXICAM 15 MG PO TABS: 15.0000 mg | ORAL_TABLET | Freq: Every day | ORAL | Status: DC

## 2015-04-17 NOTE — Assessment & Plan Note (Signed)
Well controlled with Mobic.  NO red flags on exam - Return precautions reviewed.

## 2015-04-17 NOTE — Assessment & Plan Note (Addendum)
Well controlled on triple therapy. - Refills provided on medications today - BMET, lipid panel ordered - Patient to schedule fasting lab appt - Return in 1 month for annual physical exam

## 2015-04-17 NOTE — Assessment & Plan Note (Signed)
Well controlled. A1c 6.4 today - Patient to continue Metformin - BMET, A1c ordered - Continue 1-2 times daily CBG testing

## 2015-04-17 NOTE — Patient Instructions (Signed)
Schedule an appointment for fasting labs up front.  Plan to follow up with me for physical exam and pap smear.  I have sent in your refills.

## 2015-04-17 NOTE — Progress Notes (Signed)
    Subjective: CC: DM, HTN HPI: Patient is a 64 y.o. female presenting to clinic today for office visit. Concerns today include:  1. Diabetes:  High at home: 104 Low at home: 101 Taking medications: Metformin Side effects: none ROS: denies fever, chills, dizziness, LOC, polyuria, polydipsia, numbness or tingling in extremities or chest pain. Last eye exam: 12/22/2014 Last foot exam: 09/26/2104 Last A1c: 09/27/14, 6.5 Nephropathy screen indicated?: ARB Last flu, zoster and/or pneumovax: UTD  2. Hypertension Blood pressure at home: 110/70 Blood pressure today: 142/71 Meds: Compliant with HCTZ, Losartan, Metoprolol Side effects: none ROS: Denies headache, dizziness, visual changes, nausea, vomiting, chest pain, abdominal pain or shortness of breath.  3. Low back pain Controlled with Mobic.  Was seen in urgent care and was prescribed this.  She reports that xrays were obtained of lumbar spine and OA was the only thing they could find.  No weakness, falls, numbness/tingling.  Social History Reviewed: non smoker. FamHx and MedHx updated.  Please see EMR. Health Maintenance: TDap, pap smear due  ROS: Per HPI  Objective: Office vital signs reviewed. BP 142/71 mmHg  Pulse 72  Temp(Src) 98.3 F (36.8 C) (Oral)  Ht 5\' 2"  (1.575 m)  Wt 164 lb 1.6 oz (74.435 kg)  BMI 30.01 kg/m2  Physical Examination:  General: Awake, alert, well nourished, NAD HEENT: Normal, MMM Cardio: RRR, S1S2 heard, no murmurs appreciated Pulm: CTAB, no wheezes, rhonchi or rales, normal WOB Extremities: WWP, No edema, cyanosis or clubbing; +2 radial pulses bilaterally MSK: Normal gait and station Neuro: Strength and sensation grossly intact, follows commands  Assessment/ Plan: 64 y.o. female with  Essential hypertension Well controlled on triple therapy. - Refills provided on medications today - BMET, lipid panel ordered - Patient to schedule fasting lab appt - Return in 1 month for annual  physical exam  Diabetes mellitus without complication (HCC) Well controlled. A1c 6.4 today - Patient to continue Metformin - BMET, A1c ordered - Continue 1-2 times daily CBG testing  BACK PAIN, LUMBAR, CHRONIC Well controlled with Mobic.  NO red flags on exam - Return precautions reviewed.  Need for hepatitis C screening test - Hepatitis C antibody; Future  Screening for HIV (human immunodeficiency virus) - HIV antibody; Future  Raliegh IpAshly M Gottschalk, DO PGY-2, [facility]

## 2015-05-18 ENCOUNTER — Other Ambulatory Visit (INDEPENDENT_AMBULATORY_CARE_PROVIDER_SITE_OTHER): Payer: No Typology Code available for payment source

## 2015-05-18 DIAGNOSIS — E119 Type 2 diabetes mellitus without complications: Secondary | ICD-10-CM

## 2015-05-18 DIAGNOSIS — Z1159 Encounter for screening for other viral diseases: Secondary | ICD-10-CM

## 2015-05-18 DIAGNOSIS — Z114 Encounter for screening for human immunodeficiency virus [HIV]: Secondary | ICD-10-CM

## 2015-05-18 DIAGNOSIS — I10 Essential (primary) hypertension: Secondary | ICD-10-CM

## 2015-05-18 LAB — LIPID PANEL
CHOL/HDL RATIO: 2.5 ratio (ref <=5.0)
CHOLESTEROL: 107 mg/dL — AB (ref 125–200)
HDL: 42 mg/dL — ABNORMAL LOW (ref >=46)
LDL Cholesterol: 54 mg/dL (ref <130)
Triglycerides: 56 mg/dL (ref <150)
VLDL: 11 mg/dL (ref <30)

## 2015-05-18 LAB — BASIC METABOLIC PANEL WITH GFR
BUN: 11 mg/dL (ref 7–25)
CO2: 28 mmol/L (ref 20–31)
Calcium: 9.5 mg/dL (ref 8.6–10.4)
Chloride: 105 mmol/L (ref 98–110)
Creat: 1.2 mg/dL — ABNORMAL HIGH (ref 0.50–0.99)
GFR, EST NON AFRICAN AMERICAN: 48 mL/min — AB (ref >=60)
GFR, Est African American: 55 mL/min — ABNORMAL LOW (ref >=60)
GLUCOSE: 106 mg/dL — AB (ref 65–99)
POTASSIUM: 3.9 mmol/L (ref 3.5–5.3)
Sodium: 142 mmol/L (ref 135–146)

## 2015-05-18 LAB — HEPATITIS C ANTIBODY: HCV Ab: NEGATIVE

## 2015-05-18 NOTE — Progress Notes (Signed)
Labs done today Tinna Kolker 

## 2015-05-21 LAB — HIV ANTIBODY (ROUTINE TESTING W REFLEX): HIV 1&2 Ab, 4th Generation: NONREACTIVE

## 2015-05-22 ENCOUNTER — Other Ambulatory Visit: Payer: Self-pay | Admitting: Family Medicine

## 2015-05-22 ENCOUNTER — Encounter: Payer: Self-pay | Admitting: Family Medicine

## 2015-05-22 DIAGNOSIS — R7989 Other specified abnormal findings of blood chemistry: Secondary | ICD-10-CM

## 2015-05-22 NOTE — Progress Notes (Signed)
Discussed BMP, elevated renal function.  Likely secondary to Mobic use.  Patient to discontinue for the next week.  May use 1/2 tablet if needed for pain.  Recheck BMP in 1 week.  Emery Dupuy M. Nadine CountsGottschalk, DO PGY-2, [facility]

## 2015-09-21 ENCOUNTER — Telehealth: Payer: Self-pay | Admitting: Family Medicine

## 2015-09-21 ENCOUNTER — Other Ambulatory Visit: Payer: Self-pay | Admitting: Family Medicine

## 2015-09-21 DIAGNOSIS — E118 Type 2 diabetes mellitus with unspecified complications: Secondary | ICD-10-CM

## 2015-09-21 DIAGNOSIS — K219 Gastro-esophageal reflux disease without esophagitis: Secondary | ICD-10-CM

## 2015-09-21 DIAGNOSIS — E785 Hyperlipidemia, unspecified: Secondary | ICD-10-CM

## 2015-09-21 DIAGNOSIS — I10 Essential (primary) hypertension: Secondary | ICD-10-CM

## 2015-09-21 MED ORDER — METFORMIN HCL 500 MG PO TABS: 500.0000 mg | ORAL_TABLET | Freq: Two times a day (BID) | ORAL | Status: DC

## 2015-09-21 MED ORDER — LOSARTAN POTASSIUM 50 MG PO TABS: 50.0000 mg | ORAL_TABLET | Freq: Every day | ORAL | Status: DC

## 2015-09-21 MED ORDER — RANITIDINE HCL 150 MG PO TABS: ORAL_TABLET | ORAL | Status: DC

## 2015-09-21 MED ORDER — ATORVASTATIN CALCIUM 40 MG PO TABS: 40.0000 mg | ORAL_TABLET | Freq: Every day | ORAL | Status: DC

## 2015-09-21 MED ORDER — HYDROCHLOROTHIAZIDE 25 MG PO TABS: 25.0000 mg | ORAL_TABLET | Freq: Two times a day (BID) | ORAL | Status: DC

## 2015-09-21 NOTE — Telephone Encounter (Signed)
Done

## 2015-09-21 NOTE — Telephone Encounter (Signed)
Pt is calling because she changed insurance so she has to change where she gets her medications. Hew NEW PHARMACY is [facility] on [address]. She need all new prescriptions sent in : Atorvastatin, hydrochlorothiazide, Cozaar, Metformin, Zantac. Deborah Jacobsonjw

## 2015-09-25 ENCOUNTER — Other Ambulatory Visit: Payer: Self-pay | Admitting: Family Medicine

## 2015-09-25 ENCOUNTER — Telehealth: Payer: Self-pay | Admitting: Family Medicine

## 2015-09-25 DIAGNOSIS — I10 Essential (primary) hypertension: Secondary | ICD-10-CM

## 2015-09-25 MED ORDER — METOPROLOL SUCCINATE ER 25 MG PO TB24: 25.0000 mg | ORAL_TABLET | Freq: Every day | ORAL | Status: DC

## 2015-09-25 NOTE — Telephone Encounter (Signed)
Pt called and needs a refill on her Metoprolol called in to her new pharmacy. [facility] [address]. jw

## 2015-11-16 ENCOUNTER — Encounter: Payer: Self-pay | Admitting: Family Medicine

## 2015-11-16 ENCOUNTER — Ambulatory Visit (INDEPENDENT_AMBULATORY_CARE_PROVIDER_SITE_OTHER): Payer: BLUE CROSS/BLUE SHIELD | Admitting: Family Medicine

## 2015-11-16 VITALS — BP 154/77 | HR 72 | Temp 97.7°F | Ht 62.0 in | Wt 167.0 lb

## 2015-11-16 DIAGNOSIS — R748 Abnormal levels of other serum enzymes: Secondary | ICD-10-CM | POA: Diagnosis not present

## 2015-11-16 DIAGNOSIS — E119 Type 2 diabetes mellitus without complications: Secondary | ICD-10-CM | POA: Diagnosis not present

## 2015-11-16 DIAGNOSIS — I10 Essential (primary) hypertension: Secondary | ICD-10-CM | POA: Diagnosis not present

## 2015-11-16 DIAGNOSIS — R7989 Other specified abnormal findings of blood chemistry: Secondary | ICD-10-CM

## 2015-11-16 LAB — BASIC METABOLIC PANEL WITH GFR
BUN: 16 mg/dL (ref 7–25)
CALCIUM: 9.6 mg/dL (ref 8.6–10.4)
CO2: 28 mmol/L (ref 20–31)
CREATININE: 1 mg/dL — AB (ref 0.50–0.99)
Chloride: 103 mmol/L (ref 98–110)
GFR, EST AFRICAN AMERICAN: 69 mL/min (ref >=60)
GFR, Est Non African American: 60 mL/min (ref >=60)
Glucose, Bld: 120 mg/dL — ABNORMAL HIGH (ref 65–99)
Potassium: 4.1 mmol/L (ref 3.5–5.3)
SODIUM: 142 mmol/L (ref 135–146)

## 2015-11-16 LAB — POCT GLYCOSYLATED HEMOGLOBIN (HGB A1C): HEMOGLOBIN A1C: 6.4

## 2015-11-16 MED ORDER — BENZONATATE 100 MG PO CAPS: 100.0000 mg | ORAL_CAPSULE | Freq: Three times a day (TID) | ORAL | Status: DC | PRN

## 2015-11-16 NOTE — Progress Notes (Signed)
Date of Visit: 11/16/2015   HPI:  Patient presents to follow up on renal function, also for cough.  Renal function - was contacted by PCP after labs resulted in December, saying that kidney function was a little bit down. Patient stopped taking meloxcam at that time. Back pain has done ok. She stands for 6-8 hours at a time at work.  Cough - had cold a few weeks ago. Has had lingering coughf or 3 weeks. Tried mucinex. Would like rx for tessalon perles. No fevers.   Hypertension - did not take blood pressure medications yet today as she thought she needed to be fasting. Denies chest pain or shortness of breath.   Diabetes - A1c today 6.5. She was pleased to hear this. Gets eye exam yearly.  ROS: See HPI.  PMFSH: history of hyperlipidemia, obesity, allergic rhinitis, GERD, asthma, hypertension, chronic low back pain, diabetes   PHYSICAL EXAM: BP 154/77 mmHg  Pulse 72  Temp(Src) 97.7 F (36.5 C) (Oral)  Ht 5\' 2"  (1.575 m)  Wt 167 lb (75.751 kg)  BMI 30.54 kg/m2 Gen: NAD, pleasant, cooperative HEENT: normocephalic, atraumatic  Heart: regular rate and rhythm, no murmur Lungs: clear to auscultation bilaterally, normal work of breathing  Neuro: alert, grossly nonfocal, speech normal Ext: No appreciable lower extremity edema bilaterally  Diabetic foot exam: 2+ DP pulses bilat, normal monofilament testing bilaterally. No lesions or significant calluses.   ASSESSMENT/PLAN:  Health maintenance:  -overdue on several HM items, advised scheduling physical with PCP some time in next 1-2 months to discuss  Cough - postviral, no signs of bacterial superinfection. rx tessalon per patient request.   Essential hypertension Elevated blood pressure but did not take medications yet today. Follow up to recheck at physical appointment with PCP.   Diabetes mellitus without complication (HCC) A1c well controlled today at 6.4. Continue metformin.   Cardiac: on statin, aspirin, last lipids in  December Renal: on losartan, recheck renal function today Eye: patient reports yearly eye exams Foot: normal exam today Immunizations: defer to health maintenance visit   FOLLOW UP: Follow up in 1-2 months with PCP for health maintenance visit.  GrenadaBrittany J. Pollie MeyerMcIntyre, MD [facility]

## 2015-11-16 NOTE — Patient Instructions (Signed)
Diabetes is well controlled  Rechecking kidney function today  Follow up with Dr. Nadine CountsGottschalk for a physical at some point in the next 1-2 months  Sent in tessalon for your cough  Be well, Dr. Pollie MeyerMcIntyre

## 2015-11-19 ENCOUNTER — Telehealth: Payer: Self-pay | Admitting: *Deleted

## 2015-11-19 NOTE — Assessment & Plan Note (Signed)
Elevated blood pressure but did not take medications yet today. Follow up to recheck at physical appointment with PCP.

## 2015-11-19 NOTE — Assessment & Plan Note (Signed)
A1c well controlled today at 6.4. Continue metformin.   Cardiac: on statin, aspirin, last lipids in December Renal: on losartan, recheck renal function today Eye: patient reports yearly eye exams Foot: normal exam today Immunizations: defer to health maintenance visit

## 2015-11-19 NOTE — Telephone Encounter (Signed)
Patient would like to switch PCP's to Dr. Pollie MeyerMcintyre, states she can never get in with Dr. Nadine CountsGottschalk. Informed patient would send request to our director

## 2015-12-28 NOTE — Telephone Encounter (Signed)
Dr. Pollie MeyerMcIntyre out of office and will return in August.  Will discuss with her and call patient back if status of PCP change request.  Altamese Dilling~Annalisse Minkoff, BSN, RN-BC

## 2016-01-22 ENCOUNTER — Other Ambulatory Visit: Payer: Self-pay | Admitting: Family Medicine

## 2016-01-22 DIAGNOSIS — R059 Cough, unspecified: Secondary | ICD-10-CM

## 2016-01-22 DIAGNOSIS — R05 Cough: Secondary | ICD-10-CM

## 2016-01-22 MED ORDER — ALBUTEROL SULFATE HFA 108 (90 BASE) MCG/ACT IN AERS: 2.0000 | INHALATION_SPRAY | Freq: Four times a day (QID) | RESPIRATORY_TRACT | 0 refills | Status: DC | PRN

## 2016-01-22 NOTE — Telephone Encounter (Signed)
Pt would like a refill on her inhaler, she would like 2. Please advise. Thanks! ep

## 2016-01-22 NOTE — Telephone Encounter (Signed)
Will approve once but patient has not been seen for breathing since 08/2014.  Needs appointment to address ongoing need for Albuterol.

## 2016-01-22 NOTE — Telephone Encounter (Signed)
Left voice message for patient to call for an appointment.  Laquonda Welby L, RN  

## 2016-01-31 NOTE — Progress Notes (Signed)
Subjective:     Patient ID: Deborah Simon, female   DOB: 03/25/1951, 65 y.o.   MRN: [id number]  HPI Deborah Simon is a 65yo female presenting today for medication refill. -Received a letter from [facility] [employer] stating Proventil is no longer covered. Recommends changed to Ventolin for coverage. -Patient reports symptoms are well controlled with albuterol. She normally uses albuterol at night. -Requests copy of lab work obtained at last office visit. -Denies any acute concerns.  Review of Systems Per HPI. Other systems negative.    Objective:   Physical Exam  Constitutional: She appears well-developed and well-nourished. No distress.  Cardiovascular: Normal rate.   Pulmonary/Chest: Effort normal. No respiratory distress. She has no wheezes.  Psychiatric: She has a normal mood and affect. Her behavior is normal.      Assessment and Plan:     Asthma, mild persistent -Well controlled -Will transition from Proventil to Ventolin -Follow-up with PCP

## 2016-02-01 ENCOUNTER — Encounter: Payer: Self-pay | Admitting: Family Medicine

## 2016-02-01 ENCOUNTER — Ambulatory Visit (INDEPENDENT_AMBULATORY_CARE_PROVIDER_SITE_OTHER): Payer: Medicare Other | Admitting: Family Medicine

## 2016-02-01 DIAGNOSIS — J4530 Mild persistent asthma, uncomplicated: Secondary | ICD-10-CM | POA: Diagnosis present

## 2016-02-01 MED ORDER — ALBUTEROL SULFATE HFA 108 (90 BASE) MCG/ACT IN AERS: 1.0000 | INHALATION_SPRAY | Freq: Four times a day (QID) | RESPIRATORY_TRACT | 4 refills | Status: DC | PRN

## 2016-02-01 NOTE — Patient Instructions (Signed)
Thank you so much for coming to visit today! Your Albuterol inhaler has been changed from Proventil to Ventolin. You were given a copy of your labwork.  Dr. Caroleen Hammanumley

## 2016-02-01 NOTE — Assessment & Plan Note (Signed)
-  Well controlled -Will transition from Proventil to Ventolin -Follow-up with PCP

## 2016-03-28 DIAGNOSIS — Z23 Encounter for immunization: Secondary | ICD-10-CM | POA: Diagnosis not present

## 2016-05-30 ENCOUNTER — Other Ambulatory Visit: Payer: Self-pay | Admitting: Family Medicine

## 2016-05-30 DIAGNOSIS — I10 Essential (primary) hypertension: Secondary | ICD-10-CM

## 2016-05-30 NOTE — Telephone Encounter (Signed)
Please have patient schedule appt to be seen for annual exam.  Toprol sent in x3 months.

## 2016-06-04 NOTE — Telephone Encounter (Signed)
Left message for patient to call office. If she calls back please schedule her for an annual physical. Deborah Simon S Amelya Mabry, CMA

## 2016-06-27 DIAGNOSIS — E088 Diabetes mellitus due to underlying condition with unspecified complications: Secondary | ICD-10-CM | POA: Diagnosis not present

## 2016-06-27 DIAGNOSIS — I10 Essential (primary) hypertension: Secondary | ICD-10-CM | POA: Diagnosis not present

## 2016-06-27 DIAGNOSIS — E6609 Other obesity due to excess calories: Secondary | ICD-10-CM | POA: Diagnosis not present

## 2016-07-08 DIAGNOSIS — E6609 Other obesity due to excess calories: Secondary | ICD-10-CM | POA: Diagnosis not present

## 2016-07-08 DIAGNOSIS — E538 Deficiency of other specified B group vitamins: Secondary | ICD-10-CM | POA: Diagnosis not present

## 2016-07-08 DIAGNOSIS — E088 Diabetes mellitus due to underlying condition with unspecified complications: Secondary | ICD-10-CM | POA: Diagnosis not present

## 2016-07-08 DIAGNOSIS — Z6827 Body mass index (BMI) 27.0-27.9, adult: Secondary | ICD-10-CM | POA: Diagnosis not present

## 2016-07-08 DIAGNOSIS — D649 Anemia, unspecified: Secondary | ICD-10-CM | POA: Diagnosis not present

## 2016-07-08 DIAGNOSIS — I10 Essential (primary) hypertension: Secondary | ICD-10-CM | POA: Diagnosis not present

## 2016-07-11 DIAGNOSIS — Z780 Asymptomatic menopausal state: Secondary | ICD-10-CM | POA: Diagnosis not present

## 2016-07-11 DIAGNOSIS — Z1231 Encounter for screening mammogram for malignant neoplasm of breast: Secondary | ICD-10-CM | POA: Diagnosis not present

## 2016-08-02 ENCOUNTER — Other Ambulatory Visit: Payer: Self-pay | Admitting: Family Medicine

## 2016-08-02 DIAGNOSIS — R05 Cough: Secondary | ICD-10-CM

## 2016-08-02 DIAGNOSIS — R059 Cough, unspecified: Secondary | ICD-10-CM

## 2016-08-04 NOTE — Telephone Encounter (Signed)
Please have pt schedule an appt with me for her asthma/ annual exam.  2 months of Proventil rx'd.

## 2016-08-08 DIAGNOSIS — J069 Acute upper respiratory infection, unspecified: Secondary | ICD-10-CM | POA: Diagnosis not present

## 2016-08-08 DIAGNOSIS — D649 Anemia, unspecified: Secondary | ICD-10-CM | POA: Diagnosis not present

## 2016-08-08 DIAGNOSIS — E088 Diabetes mellitus due to underlying condition with unspecified complications: Secondary | ICD-10-CM | POA: Diagnosis not present

## 2016-08-08 DIAGNOSIS — I10 Essential (primary) hypertension: Secondary | ICD-10-CM | POA: Diagnosis not present

## 2016-08-22 NOTE — Telephone Encounter (Signed)
LVM for pt to call back to assist her in scheduling a visit for her physical per Dr. Nadine CountsGottschalk message. Lamonte SakaiZimmerman Rumple, Rodney Wigger D, [place]

## 2016-08-27 ENCOUNTER — Other Ambulatory Visit: Payer: Self-pay | Admitting: Family Medicine

## 2016-08-27 DIAGNOSIS — K219 Gastro-esophageal reflux disease without esophagitis: Secondary | ICD-10-CM

## 2016-08-27 DIAGNOSIS — I10 Essential (primary) hypertension: Secondary | ICD-10-CM

## 2016-09-19 DIAGNOSIS — D649 Anemia, unspecified: Secondary | ICD-10-CM | POA: Diagnosis not present

## 2016-09-19 DIAGNOSIS — E6609 Other obesity due to excess calories: Secondary | ICD-10-CM | POA: Diagnosis not present

## 2016-09-19 DIAGNOSIS — I10 Essential (primary) hypertension: Secondary | ICD-10-CM | POA: Diagnosis not present

## 2016-09-19 DIAGNOSIS — E089 Diabetes mellitus due to underlying condition without complications: Secondary | ICD-10-CM | POA: Diagnosis not present

## 2016-11-23 ENCOUNTER — Other Ambulatory Visit: Payer: Self-pay | Admitting: Family Medicine

## 2016-11-23 DIAGNOSIS — K219 Gastro-esophageal reflux disease without esophagitis: Secondary | ICD-10-CM

## 2016-11-24 ENCOUNTER — Other Ambulatory Visit: Payer: Self-pay | Admitting: Family Medicine

## 2016-11-24 DIAGNOSIS — K219 Gastro-esophageal reflux disease without esophagitis: Secondary | ICD-10-CM

## 2016-12-16 ENCOUNTER — Other Ambulatory Visit: Payer: Self-pay | Admitting: Family Medicine

## 2016-12-16 DIAGNOSIS — I10 Essential (primary) hypertension: Secondary | ICD-10-CM

## 2017-01-02 DIAGNOSIS — L659 Nonscarring hair loss, unspecified: Secondary | ICD-10-CM | POA: Diagnosis not present

## 2017-01-02 DIAGNOSIS — E088 Diabetes mellitus due to underlying condition with unspecified complications: Secondary | ICD-10-CM | POA: Diagnosis not present

## 2017-01-02 DIAGNOSIS — D649 Anemia, unspecified: Secondary | ICD-10-CM | POA: Diagnosis not present

## 2017-01-02 DIAGNOSIS — E6609 Other obesity due to excess calories: Secondary | ICD-10-CM | POA: Diagnosis not present

## 2017-01-02 DIAGNOSIS — I10 Essential (primary) hypertension: Secondary | ICD-10-CM | POA: Diagnosis not present

## 2017-01-24 ENCOUNTER — Other Ambulatory Visit: Payer: Self-pay | Admitting: Family Medicine

## 2017-01-24 DIAGNOSIS — E118 Type 2 diabetes mellitus with unspecified complications: Secondary | ICD-10-CM

## 2017-01-27 ENCOUNTER — Other Ambulatory Visit: Payer: Self-pay | Admitting: Family Medicine

## 2017-01-27 DIAGNOSIS — E118 Type 2 diabetes mellitus with unspecified complications: Secondary | ICD-10-CM

## 2017-05-22 DIAGNOSIS — Z6826 Body mass index (BMI) 26.0-26.9, adult: Secondary | ICD-10-CM | POA: Diagnosis not present

## 2017-05-22 DIAGNOSIS — I10 Essential (primary) hypertension: Secondary | ICD-10-CM | POA: Diagnosis not present

## 2017-05-22 DIAGNOSIS — E089 Diabetes mellitus due to underlying condition without complications: Secondary | ICD-10-CM | POA: Diagnosis not present

## 2017-07-09 DIAGNOSIS — Z7984 Long term (current) use of oral hypoglycemic drugs: Secondary | ICD-10-CM | POA: Diagnosis not present

## 2017-07-09 DIAGNOSIS — H2513 Age-related nuclear cataract, bilateral: Secondary | ICD-10-CM | POA: Diagnosis not present

## 2017-07-09 DIAGNOSIS — H524 Presbyopia: Secondary | ICD-10-CM | POA: Diagnosis not present

## 2017-07-09 DIAGNOSIS — E119 Type 2 diabetes mellitus without complications: Secondary | ICD-10-CM | POA: Diagnosis not present

## 2017-07-09 DIAGNOSIS — H25013 Cortical age-related cataract, bilateral: Secondary | ICD-10-CM | POA: Diagnosis not present

## 2017-07-17 DIAGNOSIS — Z1231 Encounter for screening mammogram for malignant neoplasm of breast: Secondary | ICD-10-CM | POA: Diagnosis not present

## 2017-09-25 DIAGNOSIS — I10 Essential (primary) hypertension: Secondary | ICD-10-CM | POA: Diagnosis not present

## 2017-09-25 DIAGNOSIS — E6609 Other obesity due to excess calories: Secondary | ICD-10-CM | POA: Diagnosis not present

## 2017-09-25 DIAGNOSIS — E088 Diabetes mellitus due to underlying condition with unspecified complications: Secondary | ICD-10-CM | POA: Diagnosis not present

## 2018-01-14 ENCOUNTER — Other Ambulatory Visit: Payer: Self-pay

## 2018-01-14 DIAGNOSIS — L669 Cicatricial alopecia, unspecified: Secondary | ICD-10-CM | POA: Diagnosis not present

## 2018-01-25 DIAGNOSIS — E6609 Other obesity due to excess calories: Secondary | ICD-10-CM | POA: Diagnosis not present

## 2018-01-25 DIAGNOSIS — E088 Diabetes mellitus due to underlying condition with unspecified complications: Secondary | ICD-10-CM | POA: Diagnosis not present

## 2018-01-25 DIAGNOSIS — I10 Essential (primary) hypertension: Secondary | ICD-10-CM | POA: Diagnosis not present

## 2018-01-29 DIAGNOSIS — E6609 Other obesity due to excess calories: Secondary | ICD-10-CM | POA: Diagnosis not present

## 2018-01-29 DIAGNOSIS — Z6826 Body mass index (BMI) 26.0-26.9, adult: Secondary | ICD-10-CM | POA: Diagnosis not present

## 2018-01-29 DIAGNOSIS — E1100 Type 2 diabetes mellitus with hyperosmolarity without nonketotic hyperglycemic-hyperosmolar coma (NKHHC): Secondary | ICD-10-CM | POA: Diagnosis not present

## 2018-01-29 DIAGNOSIS — I10 Essential (primary) hypertension: Secondary | ICD-10-CM | POA: Diagnosis not present

## 2018-02-22 DIAGNOSIS — E088 Diabetes mellitus due to underlying condition with unspecified complications: Secondary | ICD-10-CM | POA: Diagnosis not present

## 2018-02-22 DIAGNOSIS — E6609 Other obesity due to excess calories: Secondary | ICD-10-CM | POA: Diagnosis not present

## 2018-02-22 DIAGNOSIS — I10 Essential (primary) hypertension: Secondary | ICD-10-CM | POA: Diagnosis not present

## 2018-02-26 DIAGNOSIS — E088 Diabetes mellitus due to underlying condition with unspecified complications: Secondary | ICD-10-CM | POA: Diagnosis not present

## 2018-02-26 DIAGNOSIS — E6609 Other obesity due to excess calories: Secondary | ICD-10-CM | POA: Diagnosis not present

## 2018-02-26 DIAGNOSIS — Z6826 Body mass index (BMI) 26.0-26.9, adult: Secondary | ICD-10-CM | POA: Diagnosis not present

## 2018-02-26 DIAGNOSIS — I10 Essential (primary) hypertension: Secondary | ICD-10-CM | POA: Diagnosis not present

## 2018-02-26 DIAGNOSIS — E1100 Type 2 diabetes mellitus with hyperosmolarity without nonketotic hyperglycemic-hyperosmolar coma (NKHHC): Secondary | ICD-10-CM | POA: Diagnosis not present

## 2018-03-01 DIAGNOSIS — E089 Diabetes mellitus due to underlying condition without complications: Secondary | ICD-10-CM | POA: Diagnosis not present

## 2018-03-01 DIAGNOSIS — Z6825 Body mass index (BMI) 25.0-25.9, adult: Secondary | ICD-10-CM | POA: Diagnosis not present

## 2018-03-01 DIAGNOSIS — D649 Anemia, unspecified: Secondary | ICD-10-CM | POA: Diagnosis not present

## 2018-03-01 DIAGNOSIS — M02831 Other reactive arthropathies, right wrist: Secondary | ICD-10-CM | POA: Diagnosis not present

## 2018-03-06 DIAGNOSIS — E119 Type 2 diabetes mellitus without complications: Secondary | ICD-10-CM | POA: Diagnosis not present

## 2018-03-06 DIAGNOSIS — M25551 Pain in right hip: Secondary | ICD-10-CM | POA: Diagnosis not present

## 2018-03-11 DIAGNOSIS — B852 Pediculosis, unspecified: Secondary | ICD-10-CM | POA: Diagnosis not present

## 2018-03-11 DIAGNOSIS — E6609 Other obesity due to excess calories: Secondary | ICD-10-CM | POA: Diagnosis not present

## 2018-03-11 DIAGNOSIS — D649 Anemia, unspecified: Secondary | ICD-10-CM | POA: Diagnosis not present

## 2018-03-11 DIAGNOSIS — E089 Diabetes mellitus due to underlying condition without complications: Secondary | ICD-10-CM | POA: Diagnosis not present

## 2018-03-11 DIAGNOSIS — I10 Essential (primary) hypertension: Secondary | ICD-10-CM | POA: Diagnosis not present

## 2018-03-11 DIAGNOSIS — E088 Diabetes mellitus due to underlying condition with unspecified complications: Secondary | ICD-10-CM | POA: Diagnosis not present

## 2018-03-11 DIAGNOSIS — E1100 Type 2 diabetes mellitus with hyperosmolarity without nonketotic hyperglycemic-hyperosmolar coma (NKHHC): Secondary | ICD-10-CM | POA: Diagnosis not present

## 2018-03-16 DIAGNOSIS — B852 Pediculosis, unspecified: Secondary | ICD-10-CM | POA: Diagnosis not present

## 2018-03-16 DIAGNOSIS — E1100 Type 2 diabetes mellitus with hyperosmolarity without nonketotic hyperglycemic-hyperosmolar coma (NKHHC): Secondary | ICD-10-CM | POA: Diagnosis not present

## 2018-03-16 DIAGNOSIS — I10 Essential (primary) hypertension: Secondary | ICD-10-CM | POA: Diagnosis not present

## 2018-03-18 DIAGNOSIS — Z23 Encounter for immunization: Secondary | ICD-10-CM | POA: Diagnosis not present

## 2018-04-12 ENCOUNTER — Encounter: Payer: Self-pay | Admitting: Gastroenterology

## 2018-05-07 ENCOUNTER — Encounter: Payer: Self-pay | Admitting: Gastroenterology

## 2018-06-25 ENCOUNTER — Ambulatory Visit (AMBULATORY_SURGERY_CENTER): Payer: Self-pay | Admitting: *Deleted

## 2018-06-25 VITALS — Ht 62.0 in | Wt 154.0 lb

## 2018-06-25 DIAGNOSIS — Z1211 Encounter for screening for malignant neoplasm of colon: Secondary | ICD-10-CM

## 2018-06-25 MED ORDER — PEG-KCL-NACL-NASULF-NA ASC-C 140 G PO SOLR: 1.0000 | Freq: Once | ORAL | 0 refills | Status: AC

## 2018-06-25 NOTE — Progress Notes (Signed)
Patient denies any allergies to eggs or soy. Patient denies any problems with anesthesia/sedation. Patient denies any oxygen use at home. Patient denies taking any diet/weight loss medications or blood thinners. EMMI education assisgned to patient on colonoscopy, this was explained and instructions given to patient. Patient states she has Medicaid, Pl[place]u given.

## 2018-07-09 ENCOUNTER — Ambulatory Visit (AMBULATORY_SURGERY_CENTER): Payer: Medicare Other | Admitting: Gastroenterology

## 2018-07-09 ENCOUNTER — Encounter: Payer: Self-pay | Admitting: Gastroenterology

## 2018-07-09 VITALS — BP 140/80 | HR 61 | Temp 97.8°F | Resp 17 | Ht 62.0 in | Wt 154.0 lb

## 2018-07-09 DIAGNOSIS — Z1211 Encounter for screening for malignant neoplasm of colon: Secondary | ICD-10-CM

## 2018-07-09 MED ORDER — SODIUM CHLORIDE 0.9 % IV SOLN: 500.0000 mL | Freq: Once | INTRAVENOUS | Status: DC

## 2018-07-09 NOTE — Op Note (Signed)
[facility] Patient Name: Deborah Simon Procedure Date: 07/09/2018 7:56 AM MRN: [id number] Endoscopist: Sherilyn Cooter L. Myrtie Neither , MD Age: 68 Referring MD:  Date of Birth: [birth year] Gender: Female Account #: [id number] Procedure:                Colonoscopy Indications:              Screening for colorectal malignant neoplasm (no                            adenomatous polyps 03/2008) Medicines:                Monitored Anesthesia Care Procedure:                Pre-Anesthesia Assessment:                           - Prior to the procedure, a History and Physical                            was performed, and patient medications and                            allergies were reviewed. The patient's tolerance of                            previous anesthesia was also reviewed. The risks                            and benefits of the procedure and the sedation                            options and risks were discussed with the patient.                            All questions were answered, and informed consent                            was obtained. Prior Anticoagulants: The patient has                            taken no previous anticoagulant or antiplatelet                            agents. ASA Grade Assessment: II - A patient with                            mild systemic disease. After reviewing the risks                            and benefits, the patient was deemed in                            satisfactory condition to undergo the procedure.  After obtaining informed consent, the colonoscope                            was passed under direct vision. Throughout the                            procedure, the patient's blood pressure, pulse, and                            oxygen saturations were monitored continuously. The                            Model CF-HQ190L [id number]) scope was introduced                            through the anus and advanced to  the the terminal                            ileum, with identification of the appendiceal                            orifice and IC valve. The colonoscopy was performed                            without difficulty. The patient tolerated the                            procedure well. The quality of the bowel                            preparation was excellent. The terminal ileum,                            ileocecal valve, appendiceal orifice, and rectum                            were photographed. The bowel preparation used was                            Plenvu. Scope In: 7:59:58 AM Scope Out: 8:13:06 AM Scope Withdrawal Time: 0 hours 10 minutes 11 seconds  Total Procedure Duration: 0 hours 13 minutes 8 seconds  Findings:                 The perianal and digital rectal examinations were                            normal.                           The terminal ileum appeared normal.                           A few small-mouthed diverticula were found in the  left colon and right colon.                           Internal hemorrhoids were found.                           The exam was otherwise without abnormality on                            direct and retroflexion views. Complications:            No immediate complications. Estimated Blood Loss:     Estimated blood loss: none. Impression:               - The examined portion of the ileum was normal.                           - Diverticulosis in the left colon and in the right                            colon.                           - Internal hemorrhoids.                           - The examination was otherwise normal on direct                            and retroflexion views.                           - No specimens collected. Recommendation:           - Patient has a contact number available for                            emergencies. The signs and symptoms of potential                             delayed complications were discussed with the                            patient. Return to normal activities tomorrow.                            Written discharge instructions were provided to the                            patient.                           - Resume previous diet.                           - Continue present medications.                           -  No repeat colonoscopy due to current age 58(66                            years or older) and the absence of colonic polyps. Henry L. Myrtie Neitheranis, MD 07/09/2018 8:19:53 AM This report has been signed electronically.

## 2018-07-09 NOTE — Patient Instructions (Signed)
YOU HAD AN ENDOSCOPIC PROCEDURE TODAY AT THE [facility]:   Refer to the procedure report that was given to you for any specific questions about what was found during the examination.  If the procedure report does not answer your questions, please call your gastroenterologist to clarify.  If you requested that your care partner not be given the details of your procedure findings, then the procedure report has been included in a sealed envelope for you to review at your convenience later.  YOU SHOULD EXPECT: Some feelings of bloating in the abdomen. Passage of more gas than usual.  Walking can help get rid of the air that was put into your GI tract during the procedure and reduce the bloating. If you had a lower endoscopy (such as a colonoscopy or flexible sigmoidoscopy) you may notice spotting of blood in your stool or on the toilet paper. If you underwent a bowel prep for your procedure, you may not have a normal bowel movement for a few days.  Please Note:  You might notice some irritation and congestion in your nose or some drainage.  This is from the oxygen used during your procedure.  There is no need for concern and it should clear up in a day or so.  SYMPTOMS TO REPORT IMMEDIATELY:   Following lower endoscopy (colonoscopy or flexible sigmoidoscopy):  Excessive amounts of blood in the stool  Significant tenderness or worsening of abdominal pains  Swelling of the abdomen that is new, acute  Fever of 100F or higher  For urgent or emergent issues, a gastroenterologist can be reached at any hour by calling [phone].   DIET:  We do recommend a small meal at first, but then you may proceed to your regular diet.  Drink plenty of fluids but you should avoid alcoholic beverages for 24 hours.  ACTIVITY:  You should plan to take it easy for the rest of today and you should NOT DRIVE or use heavy machinery until tomorrow (because of the sedation medicines used during the test).     FOLLOW UP: Our staff will call the number listed on your records the next business day following your procedure to check on you and address any questions or concerns that you may have regarding the information given to you following your procedure. If we do not reach you, we will leave a message.  However, if you are feeling well and you are not experiencing any problems, there is no need to return our call.  We will assume that you have returned to your regular daily activities without incident.  If any biopsies were taken you will be contacted by phone or by letter within the next 1-3 weeks.  Please call us at [phone] if you have not heard about the biopsies in 3 weeks.    SIGNATURES/CONFIDENTIALITY: You and/or your care partner have signed paperwork which will be entered into your electronic medical record.  These signatures attest to the fact that that the information above on your After Visit Summary has been reviewed and is understood.  Full responsibility of the confidentiality of this discharge information lies with you and/or your care-partner. 

## 2018-07-09 NOTE — Progress Notes (Signed)
PT taken to PACU. Monitors in place. VSS. Report given to RN. 

## 2018-07-09 NOTE — Progress Notes (Signed)
Pt's states no medical or surgical changes since previsit or office visit.  Pt's blood sugar- 88.  She states she "feels just fine, no feelings of low blood sugar, took Metformin yesterday, but not today."

## 2018-07-12 ENCOUNTER — Telehealth: Payer: Self-pay

## 2018-07-12 DIAGNOSIS — Z6826 Body mass index (BMI) 26.0-26.9, adult: Secondary | ICD-10-CM | POA: Diagnosis not present

## 2018-07-12 DIAGNOSIS — J069 Acute upper respiratory infection, unspecified: Secondary | ICD-10-CM | POA: Diagnosis not present

## 2018-07-12 DIAGNOSIS — I10 Essential (primary) hypertension: Secondary | ICD-10-CM | POA: Diagnosis not present

## 2018-07-12 NOTE — Telephone Encounter (Signed)
  Follow up Call-  Call back number 07/09/2018  Post procedure Call Back phone  # [phone]  Permission to leave phone message Yes  Some recent data might be hidden     Patient questions:  Do you have a fever, pain , or abdominal swelling? No. Pain Score  0 *  Have you tolerated food without any problems? Yes.    Have you been able to return to your normal activities? Yes.    Do you have any questions about your discharge instructions: Diet   No. Medications  No. Follow up visit  No.  Do you have questions or concerns about your Care? No.  Actions: * If pain score is 4 or above: No action needed, pain <4.

## 2018-07-19 DIAGNOSIS — E782 Mixed hyperlipidemia: Secondary | ICD-10-CM | POA: Diagnosis not present

## 2018-07-19 DIAGNOSIS — E1100 Type 2 diabetes mellitus with hyperosmolarity without nonketotic hyperglycemic-hyperosmolar coma (NKHHC): Secondary | ICD-10-CM | POA: Diagnosis not present

## 2018-07-19 DIAGNOSIS — I10 Essential (primary) hypertension: Secondary | ICD-10-CM | POA: Diagnosis not present

## 2018-07-19 DIAGNOSIS — Z6825 Body mass index (BMI) 25.0-25.9, adult: Secondary | ICD-10-CM | POA: Diagnosis not present

## 2018-07-19 DIAGNOSIS — D649 Anemia, unspecified: Secondary | ICD-10-CM | POA: Diagnosis not present

## 2018-07-24 DIAGNOSIS — R05 Cough: Secondary | ICD-10-CM | POA: Diagnosis not present

## 2018-07-24 DIAGNOSIS — J069 Acute upper respiratory infection, unspecified: Secondary | ICD-10-CM | POA: Diagnosis not present

## 2018-08-03 ENCOUNTER — Encounter (HOSPITAL_COMMUNITY): Payer: Self-pay | Admitting: Emergency Medicine

## 2018-08-03 ENCOUNTER — Emergency Department (HOSPITAL_COMMUNITY)
Admission: EM | Admit: 2018-08-03 | Discharge: 2018-08-04 | Disposition: A | Discharge status: Home / Self Care | Payer: Medicare Other | Attending: Emergency Medicine | Admitting: Emergency Medicine

## 2018-08-03 ENCOUNTER — Emergency Department (HOSPITAL_COMMUNITY): Payer: Medicare Other

## 2018-08-03 ENCOUNTER — Other Ambulatory Visit: Payer: Self-pay

## 2018-08-03 DIAGNOSIS — I10 Essential (primary) hypertension: Secondary | ICD-10-CM | POA: Insufficient documentation

## 2018-08-03 DIAGNOSIS — Z79899 Other long term (current) drug therapy: Secondary | ICD-10-CM | POA: Insufficient documentation

## 2018-08-03 DIAGNOSIS — J40 Bronchitis, not specified as acute or chronic: Secondary | ICD-10-CM

## 2018-08-03 DIAGNOSIS — J45909 Unspecified asthma, uncomplicated: Secondary | ICD-10-CM | POA: Insufficient documentation

## 2018-08-03 DIAGNOSIS — E119 Type 2 diabetes mellitus without complications: Secondary | ICD-10-CM | POA: Insufficient documentation

## 2018-08-03 DIAGNOSIS — J209 Acute bronchitis, unspecified: Secondary | ICD-10-CM | POA: Insufficient documentation

## 2018-08-03 DIAGNOSIS — Z7984 Long term (current) use of oral hypoglycemic drugs: Secondary | ICD-10-CM | POA: Insufficient documentation

## 2018-08-03 DIAGNOSIS — Z7982 Long term (current) use of aspirin: Secondary | ICD-10-CM | POA: Diagnosis not present

## 2018-08-03 DIAGNOSIS — R05 Cough: Secondary | ICD-10-CM | POA: Diagnosis not present

## 2018-08-03 NOTE — ED Triage Notes (Addendum)
Per EMS, patient from home, recently diagnosed with bronchitis. Reports continuous cough x1 month. Reports taking tessalon perles and cough medicine with no relief.   BP 162/98 HR 94 CBG 156 T 98.1 RR 18  Patient denies chest pain. Speaking in full sentences.

## 2018-08-04 MED ORDER — GUAIFENESIN 100 MG/5ML PO SOLN: 10.0000 mL | Freq: Once | ORAL | Status: DC

## 2018-08-04 MED ORDER — GUAIFENESIN-CODEINE 100-10 MG/5ML PO SOLN: 5.0000 mL | Freq: Every evening | ORAL | 0 refills | Status: DC | PRN

## 2018-08-04 MED ORDER — GUAIFENESIN-CODEINE 100-10 MG/5ML PO SOLN
5.0000 mL | Freq: Once | ORAL | Status: AC
  Administered 2018-08-04: 5 mL via ORAL
  Filled 2018-08-04: qty 5

## 2018-08-04 NOTE — Discharge Instructions (Signed)
Prescription given for a cough medicine with codeine in it. Take medication as directed and do not operate machinery, drive a car, or work while taking this medication as it can make you drowsy.   Please follow up with your primary care provider within 3-5 days for re-evaluation of your symptoms. If you do not have a primary care provider, information for a healthcare clinic has been provided for you to make arrangements for follow up care. Please return to the emergency department for any new or worsening symptoms.

## 2018-08-04 NOTE — ED Notes (Signed)
Pt and visitors verbalized discharge instructions and follow up care. Alert and ambulatory. Leaving with visitors.

## 2018-08-04 NOTE — ED Provider Notes (Addendum)
[facility] DEPT Provider Note   CSN: [id number] Arrival date & time: 08/03/18  2040    History   Chief Complaint Chief Complaint  Patient presents with  . Cough    HPI Deborah Simon is a 68 y.o. female.     HPI   Pt is a 68 y/o female with a h/o HTN, HLD, DM who presents to the ED today cough that has been present for the last month. States that the cough is dry. Denies chest pain or shortness of breath. Denies rhinorrhea, congestion, or sore throat. Reports she has a dry spot in her throat that keeps making her cough.   She has been seen by her pcp several times for thi s cough and was put on a zpak that she did not finish. She was seen at [facility] center and was put on codeine syrup and another course of antibiotic that she thinks was doxycycline. She did not finish these antibiotics as well. She also received steroid shot that helped her symptoms.   Past Medical History:  Diagnosis Date  . Diabetes mellitus   . Hyperlipidemia   . Hypertension     Patient Active Problem List   Diagnosis Date Noted  . Cough 08/15/2014  . Unspecified constipation 10/30/2012  . Routine general medical examination at a health care facility 10/04/2012  . Asthma, mild persistent 08/12/2012  . External hemorrhoids without complication 27/74/1287  . GERD (gastroesophageal reflux disease) 11/13/2011  . Chronic cholecystitis with calculus 09/01/2011  . Diabetes mellitus without complication ([place]) 86/76/7209  . HYPERLIPIDEMIA 03/07/2009  . ALLERGIC RHINITIS 04/28/2008  . BACK PAIN, LUMBAR, CHRONIC 08/17/2007  . OBESITY 01/07/2007  . Essential hypertension 09/01/2006    Past Surgical History:  Procedure Laterality Date  . CHOLECYSTECTOMY  10/07/11   Single site  . COLONOSCOPY  03/27/2008   Dr.Patterson-normal     OB History   No obstetric history on file.      Home Medications    Prior to Admission medications   Medication Sig Start Date  End Date Taking? Authorizing Provider  aspirin EC 81 MG tablet Take 81 mg by mouth daily.    [provider]  atorvastatin (LIPITOR) 40 MG tablet Take 1 tablet (40 mg total) by mouth daily. 09/21/15   Janora Norlander, DO  Blood Glucose Monitoring Suppl (FREESTYLE FREEDOM LITE) W/DEVICE KIT 1 Units by Does not apply route once. Use to test blood glucose once a day 03/17/14   Willeen Niece, MD  docusate sodium (COLACE) 100 MG capsule Take 100 mg by mouth 2 (two) times daily as needed for mild constipation.    [provider]  EPINEPHrine 0.3 mg/0.3 mL IJ SOAJ injection If shortness of breath develops, inject into thigh  then call 911.  May repeat in 5 minutes if no improvement. Patient not taking: Reported on 06/25/2018 08/18/14   McDiarmid, Blane Ohara, MD  ferrous sulfate 325 (65 FE) MG tablet Take 325 mg by mouth daily with breakfast.    [provider]  guaiFENesin-codeine 100-10 MG/5ML syrup Take 5 mLs by mouth at bedtime as needed for up to 5 doses for cough. 08/04/18   Janiylah Hannis S, PA-C  hydrochlorothiazide (HYDRODIURIL) 25 MG tablet Take 1 tablet (25 mg total) by mouth 2 (two) times daily. 09/21/15   Janora Norlander, DO  losartan (COZAAR) 50 MG tablet Take 1 tablet (50 mg total) by mouth daily. 09/21/15   Janora Norlander, DO  meloxicam (  MOBIC) 15 MG tablet meloxicam 15 mg tablet  TAKE 1 TABLET(S) EVERY DAY BY ORAL ROUTE FOR 30 DAYS.    [provider]  metFORMIN (GLUCOPHAGE) 500 MG tablet Take 1 tablet (500 mg total) by mouth 2 (two) times daily with a meal. 09/21/15   Ronnie Doss M, DO  metoprolol succinate (TOPROL-XL) 25 MG 24 hr tablet TAKE 1 TABLET BY MOUTH DAILY 08/27/16   Janora Norlander, DO    Family History Family History  Problem Relation Age of Onset  . Stroke Mother   . Stroke Father   . Colon cancer Neg Hx   . Colon polyps Neg Hx   . Esophageal cancer Neg Hx   . Stomach cancer Neg Hx   . Rectal cancer Neg Hx     Social  History Social History   Tobacco Use  . Smoking status: Never Smoker  . Smokeless tobacco: Never Used  Substance Use Topics  . Alcohol use: No  . Drug use: No     Allergies   Omeprazole; Ace inhibitors; Azithromycin; Lisinopril; and Penicillins   Review of Systems Review of Systems  Constitutional: Negative for fever.  HENT: Negative for congestion, rhinorrhea and sore throat.        Dry throat  Eyes: Negative for visual disturbance.  Respiratory: Positive for cough. Negative for shortness of breath.   Cardiovascular: Negative for chest pain and leg swelling.  Gastrointestinal: Negative for abdominal pain.  Genitourinary: Negative for pelvic pain.  Musculoskeletal: Negative for back pain.  Skin: Negative for rash.  Neurological: Negative for headaches.     Physical Exam Updated Vital Signs BP (!) 161/97 (BP Location: Left Arm)   Pulse 91   Temp 98.1 F (36.7 C) (Oral)   Resp 16   SpO2 100%   Physical Exam Vitals signs and nursing note reviewed.  Constitutional:      General: She is not in acute distress.    Appearance: She is well-developed. She is not ill-appearing or toxic-appearing.  HENT:     Head: Normocephalic and atraumatic.  Eyes:     Conjunctiva/sclera: Conjunctivae normal.  Neck:     Musculoskeletal: Neck supple.  Cardiovascular:     Rate and Rhythm: Normal rate and regular rhythm.     Heart sounds: Normal heart sounds. No murmur.  Pulmonary:     Effort: Pulmonary effort is normal. No respiratory distress.     Breath sounds: Normal breath sounds. No stridor. No wheezing or rhonchi.     Comments: Dry Cough on exam. Abdominal:     General: Bowel sounds are normal.     Palpations: Abdomen is soft.     Tenderness: There is no abdominal tenderness.  Musculoskeletal:        General: No tenderness.     Right lower leg: No edema.     Left lower leg: No edema.  Skin:    General: Skin is warm and dry.  Neurological:     Mental Status: She is  alert.      ED Treatments / Results  Labs (all labs ordered are listed, but only abnormal results are displayed) Labs Reviewed - No data to display  EKG None  Radiology Dg Chest 2 View  Result Date: 08/03/2018 CLINICAL DATA:  Cough EXAM: CHEST - 2 VIEW COMPARISON:  08/15/2014 FINDINGS: The heart size and mediastinal contours are within normal limits. Both lungs are clear. The visualized skeletal structures are unremarkable. IMPRESSION: Clear lungs Electronically Signed   By: Lennette Bihari  [place] M.D.   On: 08/03/2018 22:20    Procedures Procedures (including critical care time)  Medications Ordered in ED Medications  guaiFENesin-codeine 100-10 MG/5ML solution 5 mL (5 mLs Oral Given 08/04/18 0203)     Initial Impression / Assessment and Plan / ED Course  I have reviewed the triage vital signs and the nursing notes.  Pertinent labs & imaging results that were available during my care of the patient were reviewed by me and considered in my medical decision making (see chart for details).     Final Clinical Impressions(s) / ED Diagnoses   Final diagnoses:  Bronchitis   Patient presenting to the ED today for evaluation of a cough for 1 month.  She has had several negative x-rays by her PCP.  She has also been started on 2 rounds of antibiotics of which both of them she has not finished.  She also has received cough medication without significant relief of symptoms.  No chest pain or shortness of breath.  No lower extremity swelling.  No risk factors for DVT/PE.  No other URI symptoms.  No fevers.  Afebrile here with normal vital signs other than hypertension which she has a history of.  Have low suspicion for PE in this patient.  Chest x-ray is negative.  Suspect persistent cough likely due to bronchitis.  Do not suspect a bacterial cause at this time.  She states that her cough actually occurs because she has a dry scratchy throat.  I advised cough drops, Chloraseptic spray, humidifier in  her room, hydration.  I gave her a dose of guaifenesin with codeine in the ED and plan for reassessment.  2:33 PM pt seen ambulating in the department attempting to leave. She states she feels much improved and is requesting to be discharged.   Given patient feels so much improvement after a dose of cough medication in the ED and that she is requesting discharge, feel she is likely safe for outpatient management.  We will give her Rx for medication received in the ED.  [facility] narcotic database reviewed and there are no red flags.  Have advised her to follow-up with her PCP.  Return precautions discussed.  All questions answered.  Patient stable for discharge.  ED Discharge Orders         Ordered    guaiFENesin-codeine 100-10 MG/5ML syrup  At bedtime PRN     08/04/18 0233           Rodney Booze, PA-C 08/04/18 0857    Rodney Booze, PA-C 08/04/18 0858    Fatima Blank, MD 08/05/18 [phone]

## 2018-08-13 DIAGNOSIS — Z6825 Body mass index (BMI) 25.0-25.9, adult: Secondary | ICD-10-CM | POA: Diagnosis not present

## 2018-08-13 DIAGNOSIS — E785 Hyperlipidemia, unspecified: Secondary | ICD-10-CM | POA: Diagnosis not present

## 2018-08-13 DIAGNOSIS — I10 Essential (primary) hypertension: Secondary | ICD-10-CM | POA: Diagnosis not present

## 2018-08-13 DIAGNOSIS — J9809 Other diseases of bronchus, not elsewhere classified: Secondary | ICD-10-CM | POA: Diagnosis not present

## 2018-08-20 DIAGNOSIS — Z1231 Encounter for screening mammogram for malignant neoplasm of breast: Secondary | ICD-10-CM | POA: Diagnosis not present

## 2018-09-30 DIAGNOSIS — Z0000 Encounter for general adult medical examination without abnormal findings: Secondary | ICD-10-CM | POA: Diagnosis not present

## 2018-10-08 DIAGNOSIS — I10 Essential (primary) hypertension: Secondary | ICD-10-CM | POA: Diagnosis not present

## 2018-10-08 DIAGNOSIS — M62838 Other muscle spasm: Secondary | ICD-10-CM | POA: Diagnosis not present

## 2018-10-22 DIAGNOSIS — I10 Essential (primary) hypertension: Secondary | ICD-10-CM | POA: Diagnosis not present

## 2018-10-22 DIAGNOSIS — E782 Mixed hyperlipidemia: Secondary | ICD-10-CM | POA: Diagnosis not present

## 2018-10-22 DIAGNOSIS — E1169 Type 2 diabetes mellitus with other specified complication: Secondary | ICD-10-CM | POA: Diagnosis not present

## 2018-10-22 DIAGNOSIS — G546 Phantom limb syndrome with pain: Secondary | ICD-10-CM | POA: Diagnosis not present

## 2018-10-22 DIAGNOSIS — M130 Polyarthritis, unspecified: Secondary | ICD-10-CM | POA: Diagnosis not present

## 2018-11-19 ENCOUNTER — Ambulatory Visit
Admission: RE | Admit: 2018-11-19 | Discharge: 2018-11-19 | Disposition: A | Discharge status: Home / Self Care | Payer: Medicare Other | Source: Ambulatory Visit | Attending: Family Medicine | Admitting: Family Medicine

## 2018-11-19 ENCOUNTER — Other Ambulatory Visit: Payer: Self-pay | Admitting: Family Medicine

## 2018-11-19 DIAGNOSIS — R52 Pain, unspecified: Secondary | ICD-10-CM

## 2018-11-19 DIAGNOSIS — M1611 Unilateral primary osteoarthritis, right hip: Secondary | ICD-10-CM | POA: Diagnosis not present

## 2018-11-19 DIAGNOSIS — M160 Bilateral primary osteoarthritis of hip: Secondary | ICD-10-CM | POA: Diagnosis not present

## 2018-11-19 DIAGNOSIS — E782 Mixed hyperlipidemia: Secondary | ICD-10-CM | POA: Diagnosis not present

## 2018-11-19 DIAGNOSIS — E1169 Type 2 diabetes mellitus with other specified complication: Secondary | ICD-10-CM | POA: Diagnosis not present

## 2018-11-19 DIAGNOSIS — M130 Polyarthritis, unspecified: Secondary | ICD-10-CM | POA: Diagnosis not present

## 2018-11-19 DIAGNOSIS — I10 Essential (primary) hypertension: Secondary | ICD-10-CM | POA: Diagnosis not present

## 2018-12-10 DIAGNOSIS — M130 Polyarthritis, unspecified: Secondary | ICD-10-CM | POA: Diagnosis not present

## 2018-12-10 DIAGNOSIS — I10 Essential (primary) hypertension: Secondary | ICD-10-CM | POA: Diagnosis not present

## 2018-12-10 DIAGNOSIS — E669 Obesity, unspecified: Secondary | ICD-10-CM | POA: Diagnosis not present

## 2018-12-10 DIAGNOSIS — E1100 Type 2 diabetes mellitus with hyperosmolarity without nonketotic hyperglycemic-hyperosmolar coma (NKHHC): Secondary | ICD-10-CM | POA: Diagnosis not present

## 2019-01-12 DIAGNOSIS — Z23 Encounter for immunization: Secondary | ICD-10-CM | POA: Diagnosis not present

## 2019-03-11 DIAGNOSIS — E6609 Other obesity due to excess calories: Secondary | ICD-10-CM | POA: Diagnosis not present

## 2019-03-11 DIAGNOSIS — E1169 Type 2 diabetes mellitus with other specified complication: Secondary | ICD-10-CM | POA: Diagnosis not present

## 2019-03-11 DIAGNOSIS — E785 Hyperlipidemia, unspecified: Secondary | ICD-10-CM | POA: Diagnosis not present

## 2019-03-11 DIAGNOSIS — R251 Tremor, unspecified: Secondary | ICD-10-CM | POA: Diagnosis not present

## 2019-03-11 DIAGNOSIS — I10 Essential (primary) hypertension: Secondary | ICD-10-CM | POA: Diagnosis not present

## 2019-03-11 DIAGNOSIS — M130 Polyarthritis, unspecified: Secondary | ICD-10-CM | POA: Diagnosis not present

## 2019-03-11 DIAGNOSIS — Z6827 Body mass index (BMI) 27.0-27.9, adult: Secondary | ICD-10-CM | POA: Diagnosis not present

## 2019-03-31 ENCOUNTER — Ambulatory Visit (INDEPENDENT_AMBULATORY_CARE_PROVIDER_SITE_OTHER): Payer: Medicare Other | Admitting: Neurology

## 2019-03-31 ENCOUNTER — Encounter: Payer: Self-pay | Admitting: Neurology

## 2019-03-31 ENCOUNTER — Other Ambulatory Visit: Payer: Self-pay

## 2019-03-31 ENCOUNTER — Telehealth: Payer: Self-pay | Admitting: Neurology

## 2019-03-31 VITALS — BP 198/106 | HR 85 | Ht 62.5 in | Wt 166.0 lb

## 2019-03-31 DIAGNOSIS — F439 Reaction to severe stress, unspecified: Secondary | ICD-10-CM | POA: Diagnosis not present

## 2019-03-31 DIAGNOSIS — R251 Tremor, unspecified: Secondary | ICD-10-CM | POA: Diagnosis not present

## 2019-03-31 NOTE — Progress Notes (Signed)
Subjective:    Patient ID: Deborah Simon is a 68 y.o. female.  HPI     Star Age, MD, PhD [facility] [address] P.O. [place], [place] [address]  Dear Dr. Criss Rosales,   I saw your patient, Deborah Simon, upon your kind request to my neurologic clinic today for initial consultation of her right hand tremor.  The patient is unaccompanied today.  As you know, Deborah Simon is a 68 year old right-handed woman with an underlying medical history of hypertension, hyperlipidemia, type 2 diabetes, and overweight state, who reports an intermittent right hand tremor for the past 4 weeks or so.  Symptoms came on fairly suddenly.  They are primarily related to using her right hand and arm.  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  She has quite a bit of stress at work and is contemplating retiring.  She does better at home.  She gets tearful at times because of stress and trembling and it helps to calm her down by talking to her.  Her son has been helpful in that regard and has been able to calm her down.  Rubbing her hands on her thighs helps calm her down.  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  She does not drink caffeine on a day-to-day basis, she does not drink alcohol and is a non-smoker.  She tries to hydrate well, drinks about 2 bottles of 16 ounce water per day at work and also water at home.  She lives with one of her 2 sons who are grown.  She has not had any treatment for anxiety or stress. I reviewed your office note from 03/11/2019, which you kindly included.  She had blood work in your office on 03/11/2019 and I reviewed the results: Lipid panel showed benign findings with a total cholesterol of 139, HDL  60, triglycerides 54 and LDL 66.  CMP showed glucose mildly elevated at 120, BUN 19, creatinine 1.14, otherwise normal findings, CBC with differential showed WBC of 6.5, hemoglobin 11.4 which is mildly low and hematocrit 33.7 which is also mildly low, platelets mildly elevated at 439, otherwise benign findings, A1c was mildly elevated at 6.6. She denies a family history of tremor or Parkinson's disease.  She has not fallen.  She has an eye examination once a year with Dr. Gershon Crane and is due for an appointment.  Her Past Medical History Is Significant For: Past Medical History:  Diagnosis Date  . Diabetes mellitus   . Hyperlipidemia   . Hypertension     Her Past Surgical History Is Significant For: Past Surgical History:  Procedure Laterality Date  . CHOLECYSTECTOMY  10/07/11   Single site  . COLONOSCOPY  03/27/2008   Dr.Patterson-normal    Her Family History Is Significant For: Family History  Problem Relation Age of Onset  . Stroke Mother   . Stroke Father   . Colon cancer Neg Hx   . Colon polyps Neg Hx   . Esophageal cancer Neg Hx   . Stomach cancer Neg Hx   . Rectal cancer Neg Hx     Her Social History Is Significant For: Social History   Socioeconomic History  . Marital  status: Single    Spouse name: Not on file  . Number of children: Not on file  . Years of education: Not on file  . Highest education level: Not on file  Occupational History  . Not on file  Social Needs  . Financial resource strain: Not on file  . Food insecurity    Worry: Not on file    Inability: Not on file  . Transportation needs    Medical: Not on file    Non-medical: Not on file  Tobacco Use  . Smoking status: Never Smoker  . Smokeless tobacco: Never Used  Substance and Sexual Activity  . Alcohol use: No  . Drug use: No  . Sexual activity: Not on file  Lifestyle  . Physical activity    Days per week: Not on file    Minutes per session: Not on file  . Stress: Not on file   Relationships  . Social Herbalist on phone: Not on file    Gets together: Not on file    Attends religious service: Not on file    Active member of club or organization: Not on file    Attends meetings of clubs or organizations: Not on file    Relationship status: Not on file  Other Topics Concern  . Not on file  Social History Narrative  . Not on file    Her Allergies Are:  Allergies  Allergen Reactions  . Omeprazole Other (See Comments)    headache  . Ace Inhibitors Swelling    Angioedema   . Azithromycin Swelling  . Lisinopril Swelling  . Penicillins Other (See Comments)    Note  Sure rash of some sort probably, nothing airway related  :   Her Current Medications Are:  Outpatient Encounter Medications as of 03/31/2019  Medication Sig  . amLODipine (NORVASC) 5 MG tablet Take 5 mg by mouth daily.  Marland Kitchen aspirin EC 81 MG tablet Take 81 mg by mouth daily.  Marland Kitchen atorvastatin (LIPITOR) 40 MG tablet Take 1 tablet (40 mg total) by mouth daily.  . baclofen (LIORESAL) 10 MG tablet Take 10 mg by mouth 2 (two) times daily.  . Blood Glucose Monitoring Suppl (FREESTYLE FREEDOM LITE) W/DEVICE KIT 1 Units by Does not apply route once. Use to test blood glucose once a day  . cetirizine (ZYRTEC) 10 MG tablet Take 10 mg by mouth daily.  . Chlorphen-PE-Acetaminophen (NOREL AD) 4-10-325 MG TABS Take by mouth.  . cyclobenzaprine (FLEXERIL) 10 MG tablet Take 10 mg by mouth 3 (three) times daily.  Marland Kitchen docusate sodium (COLACE) 100 MG capsule Take 100 mg by mouth 2 (two) times daily as needed for mild constipation.  Marland Kitchen EPINEPHrine 0.3 mg/0.3 mL IJ SOAJ injection If shortness of breath develops, inject into thigh  then call 911.  May repeat in 5 minutes if no improvement.  Marland Kitchen etodolac (LODINE) 400 MG tablet Take 400 mg by mouth 2 (two) times daily.  . ferrous sulfate 325 (65 FE) MG tablet Take 325 mg by mouth daily with breakfast.  . hydrochlorothiazide (HYDRODIURIL) 25 MG tablet Take 1 tablet  (25 mg total) by mouth 2 (two) times daily.  Marland Kitchen losartan (COZAAR) 100 MG tablet Take 100 mg by mouth daily.  Marland Kitchen losartan (COZAAR) 50 MG tablet Take 1 tablet (50 mg total) by mouth daily.  . meloxicam (MOBIC) 15 MG tablet meloxicam 15 mg tablet  TAKE 1 TABLET(S) EVERY DAY BY ORAL ROUTE FOR 30 DAYS.  Marland Kitchen  metFORMIN (GLUCOPHAGE) 500 MG tablet Take 1 tablet (500 mg total) by mouth 2 (two) times daily with a meal.  . metoprolol succinate (TOPROL-XL) 25 MG 24 hr tablet TAKE 1 TABLET BY MOUTH DAILY  . montelukast (SINGULAIR) 10 MG tablet Take 10 mg by mouth at bedtime.  . ranitidine (ZANTAC) 150 MG capsule Take 150 mg by mouth 2 (two) times daily.  . traMADol (ULTRAM) 50 MG tablet Take by mouth every 6 (six) hours as needed.  . [DISCONTINUED] cyclobenzaprine (FLEXERIL) 5 MG tablet Take 5 mg by mouth at bedtime.  . [DISCONTINUED] guaiFENesin-codeine 100-10 MG/5ML syrup Take 5 mLs by mouth at bedtime as needed for up to 5 doses for cough.   Facility-Administered Encounter Medications as of 03/31/2019  Medication  . 0.9 %  sodium chloride infusion  :   Review of Systems:  Out of a complete 14 point review of systems, all are reviewed and negative with the exception of these symptoms as listed below: Review of Systems  Neurological:       Pt presents today to discuss an intermittent tremor in her right hand that she has noticed for a few weeks. Pt is right handed.    Objective:  Neurological Exam  Physical Exam Physical Examination:   Vitals:   03/31/19 0943  BP: (!) 198/106  Pulse: 85    General Examination: The patient is a very pleasant 68 y.o. female in no acute distress. She appears well-developed and well-nourished and well groomed. Her blood pressure was elevated, she was mildly anxious.  She did not have any symptoms from her elevated blood pressure such as chest pain or shortness of breath or headache or blurry vision.  HEENT: Normocephalic, atraumatic, pupils are equal, round and  reactive to light and accommodation. Funduscopic exam is a little difficult because of bilateral cataracts but does not show any obvious abnormality. Extraocular tracking is good without limitation to gaze excursion or nystagmus noted. Normal smooth pursuit is noted. Hearing is grossly intact. face is symmetric with normal facial animation and normal facial sensation. Speech is clear with no dysarthria noted. There is no hypophonia. There is no lip, neck/head, jaw or voice tremor. Neck is supple with full range of passive and active motion. There are no carotid bruits on auscultation. Oropharynx exam reveals: mild mouth dryness, adequate dental hygiene and mild airway crowding. Tongue protrudes centrally and palate elevates symmetrically.  Chest: Clear to auscultation without wheezing, rhonchi or crackles noted.  Heart: S1+S2+0, regular and normal without murmurs, rubs or gallops noted.   Abdomen: Soft, non-tender and non-distended with normal bowel sounds appreciated on auscultation.  Extremities: There is no pitting edema in the distal lower extremities bilaterally. Pedal pulses are intact.  Skin: Warm and dry without trophic changes noted.  Musculoskeletal: exam reveals no obvious joint deformities, tenderness or joint swelling or erythema.   Neurologically:  Mental status: The patient is awake, alert and oriented in all 4 spheres. Her immediate and remote memory, attention, language skills and fund of knowledge are appropriate. There is no evidence of aphasia, agnosia, apraxia or anomia. Speech is clear with normal prosody and enunciation. Thought process is linear. Mood is normal and affect is normal.  Cranial nerves II - XII are as described above under HEENT exam. In addition: shoulder shrug is normal with equal shoulder height noted. Motor exam: Normal bulk, strength and tone is noted. There is no drift, or rebound.  On 03/31/2019: On Archimedes spiral drawing she has insecurity with the  left hand but no telltale trembling, with the right hand she has no tremor and very slight insecurity in tracing the spiral, handwriting with the right hand is legible, not tremulous, not micrographic. She has no postural, or action tremor in the upper extremities, no resting tremor in the upper or lower extremities.  No intention tremor.  Romberg is negative. Reflexes are 2+ throughout. Babinski: Toes are flexor bilaterally. Fine motor skills and coordination: intact with normal finger taps, normal hand movements, normal rapid alternating patting, normal foot taps and normal foot agility.  Cerebellar testing: No dysmetria or intention tremor on finger to nose testing. Heel to shin is unremarkable bilaterally. There is no truncal or gait ataxia.  Sensory exam: intact to light touch, vibration, temperature sense in the upper and lower extremities.  Gait, station and balance: She stands easily. No veering to one side is noted. No leaning to one side is noted. Posture is age-appropriate and stance is narrow based. Gait shows normal stride length and normal pace. No problems turning are noted. Tandem walk is unremarkable.         Assessment and Plan:   In summary, Shaunette Gassner is a very pleasant 68 y.o.-year old female with an underlying medical history of hypertension, hyperlipidemia, type 2 diabetes, and overweight state, who Presents for evaluation of her intermittent right hand tremor, noted over the past 4 weeks with fairly sudden onset.  She has noticed a correlation with anxiety and stress.  She has no telltale family history of tremor, examination and history are not in keeping with essential tremor. She does not have any obvious tremor today, in particular, she has no resting tremor, no postural tremor, no action tremor, and no intention tremor. In addition, she does not have any signs of parkinsonism.  She is largely reassured today.  She is advised to talk to about her stress and anxiety.  She is  advised to stay well-hydrated with water and well rested.  I will order a TSH today as thyroid dysfunction can sometimes cause trembling.  She does not over indulge in caffeine, in fact, she does not drink any caffeine on a day-to-day basis.  She is advised that an intermittent tremor does not have to be treated with medication.  I would like to proceed with a brain MRI with and without contrast to rule out a structural cause of her trembling.  She is agreeable.  She is advised to follow-up with you.  She is also advised to make her yearly eye checkup appointment with Dr. Kellie Moor office.  She does appear to have mild cataracts bilaterally.  So long as her brain MRI shows age-appropriate and reassuring findings I would be happy to see her back on an as-needed basis. She is advised of tremor triggers.  She has noticed a correlation with stress. I answered all her questions today and she was in agreement.  Thank you very much for allowing me to participate in the care of this nice patient. If I can be of any further assistance to you please do not hesitate to call me at [phone].  Sincerely,   Star Age, MD, PhD

## 2019-03-31 NOTE — Patient Instructions (Signed)
You have an intermittent tremor of the right hand. You do not currently have any tremors, I also do not see any signs of parkinsonism which is reassuring.  For your tremor, I would not recommend any new medication for fear of side effects (especially sleepiness) or medication interactions, especially in light of The intermittent nature of your tremor.  I would like to proceed with a blood test called TSH, Which is a thyroid screening test.  I would also like to proceed with a brain MRI with and without contrast to rule out any structural cause of your trembling.  So long as you are brain MRI is reassuring, I can see you back on an as-needed basis. We will call you with the results.  Please talk to your primary care physician about stress management and anxiety management as there could be a correlation with your tremor especially since you have noticed tremor at work under stress. Please remember, that any kind of tremor may be exacerbated by anxiety, anger, nervousness, stress, excitement, dehydration, sleep deprivation, by caffeine, and low blood sugar values or blood sugar fluctuations and thyroid dysfunction.

## 2019-03-31 NOTE — Telephone Encounter (Signed)
medicare order sent to GI. No auth they will reach out to the patient to schedule.  

## 2019-04-01 LAB — TSH: TSH: 0.757 u[IU]/mL (ref 0.450–4.500)

## 2019-04-01 NOTE — Progress Notes (Signed)
TSH, thyroid screening test was normal, pls update pt. Deborah Simon

## 2019-04-04 ENCOUNTER — Telehealth: Payer: Self-pay

## 2019-04-04 DIAGNOSIS — E782 Mixed hyperlipidemia: Secondary | ICD-10-CM | POA: Diagnosis not present

## 2019-04-04 DIAGNOSIS — E6609 Other obesity due to excess calories: Secondary | ICD-10-CM | POA: Diagnosis not present

## 2019-04-04 DIAGNOSIS — I10 Essential (primary) hypertension: Secondary | ICD-10-CM | POA: Diagnosis not present

## 2019-04-04 DIAGNOSIS — E1169 Type 2 diabetes mellitus with other specified complication: Secondary | ICD-10-CM | POA: Diagnosis not present

## 2019-04-04 NOTE — Telephone Encounter (Signed)
-----   Message from Star Age, MD sent at 04/01/2019 10:42 AM EDT ----- TSH, thyroid screening test was normal, pls update pt. Michel Bickers

## 2019-04-04 NOTE — Telephone Encounter (Signed)
I called pt and advised her of normal TSH. Pt verbalized understanding of results. Pt had no questions at this time but was encouraged to call back if questions arise.

## 2019-04-22 ENCOUNTER — Ambulatory Visit
Admission: RE | Admit: 2019-04-22 | Discharge: 2019-04-22 | Disposition: A | Discharge status: Home / Self Care | Payer: Medicare Other | Source: Ambulatory Visit | Attending: Neurology | Admitting: Neurology

## 2019-04-22 ENCOUNTER — Other Ambulatory Visit: Payer: Self-pay

## 2019-04-22 DIAGNOSIS — F439 Reaction to severe stress, unspecified: Secondary | ICD-10-CM

## 2019-04-22 DIAGNOSIS — R251 Tremor, unspecified: Secondary | ICD-10-CM | POA: Diagnosis not present

## 2019-04-22 MED ORDER — GADOBENATE DIMEGLUMINE 529 MG/ML IV SOLN
7.0000 mL | Freq: Once | INTRAVENOUS | Status: AC | PRN
  Administered 2019-04-22: 7 mL via INTRAVENOUS

## 2019-04-26 ENCOUNTER — Telehealth: Payer: Self-pay

## 2019-04-26 NOTE — Telephone Encounter (Signed)
I called pt to discuss her MRI results. No answer, left a message asking her to call me back. 

## 2019-04-26 NOTE — Progress Notes (Signed)
Please call patient and advise her that her recent brain MRI with and without contrast showed no obvious cause for her tremors.  There was a nonspecific finding of volume loss in her brain which was concentrated around the temporal areas.  We can monitor this.  I had suggested follow-up as needed for her tremors.  I recommend that she be monitored clinically for other symptoms such as memory loss, As brain volume loss which we call atrophy can be seen in the context of memory loss and dementia.  I would like to suggest a follow-up in about 5 months.  Please offer FU app with me or NP in that time frame.

## 2019-04-26 NOTE — Telephone Encounter (Signed)
-----   Message from Star Age, MD sent at 04/26/2019 12:29 PM EST ----- Please call patient and advise her that her recent brain MRI with and without contrast showed no obvious cause for her tremors.  There was a nonspecific finding of volume loss in her brain which was concentrated around the temporal areas.  We can monitor this.  I had suggested follow-up as needed for her tremors.  I recommend that she be monitored clinically for other symptoms such as memory loss, As brain volume loss which we call atrophy can be seen in the context of memory loss and dementia.  I would like to suggest a follow-up in about 5 months.  Please offer FU app with me or NP in that time frame.

## 2019-04-28 NOTE — Telephone Encounter (Signed)
I called pt to discuss her MRI results. No answer, left a message asking her to call me back. 

## 2019-05-03 NOTE — Telephone Encounter (Signed)
I called pt and discussed her MRI results and recommendations with her. Pt is agreeable to a 5 mo follow up to monitor memory and tremors. An appt was scheduled for 10/03/18 at 11:30am with MM. Pt asked that a letter be mailed to her with this appt date and time in it. Pt verbalized understanding of results. Pt had no questions at this time but was encouraged to call back if questions arise.

## 2019-05-17 DIAGNOSIS — F064 Anxiety disorder due to known physiological condition: Secondary | ICD-10-CM | POA: Diagnosis not present

## 2019-05-17 DIAGNOSIS — E1169 Type 2 diabetes mellitus with other specified complication: Secondary | ICD-10-CM | POA: Diagnosis not present

## 2019-05-17 DIAGNOSIS — R251 Tremor, unspecified: Secondary | ICD-10-CM | POA: Diagnosis not present

## 2019-05-20 DIAGNOSIS — M5441 Lumbago with sciatica, right side: Secondary | ICD-10-CM | POA: Diagnosis not present

## 2019-06-30 ENCOUNTER — Other Ambulatory Visit: Payer: Self-pay

## 2019-06-30 ENCOUNTER — Ambulatory Visit (INDEPENDENT_AMBULATORY_CARE_PROVIDER_SITE_OTHER): Payer: Medicare Other | Admitting: Family Medicine

## 2019-06-30 ENCOUNTER — Encounter: Payer: Self-pay | Admitting: Family Medicine

## 2019-06-30 VITALS — BP 130/82 | HR 98 | Temp 97.8°F | Ht 62.5 in | Wt 164.8 lb

## 2019-06-30 DIAGNOSIS — E1169 Type 2 diabetes mellitus with other specified complication: Secondary | ICD-10-CM | POA: Diagnosis not present

## 2019-06-30 DIAGNOSIS — R109 Unspecified abdominal pain: Secondary | ICD-10-CM

## 2019-06-30 DIAGNOSIS — I10 Essential (primary) hypertension: Secondary | ICD-10-CM

## 2019-06-30 DIAGNOSIS — R413 Other amnesia: Secondary | ICD-10-CM

## 2019-06-30 DIAGNOSIS — R251 Tremor, unspecified: Secondary | ICD-10-CM

## 2019-06-30 DIAGNOSIS — E785 Hyperlipidemia, unspecified: Secondary | ICD-10-CM

## 2019-06-30 DIAGNOSIS — E119 Type 2 diabetes mellitus without complications: Secondary | ICD-10-CM | POA: Diagnosis not present

## 2019-06-30 MED ORDER — ETODOLAC 400 MG PO TABS: 400.0000 mg | ORAL_TABLET | Freq: Two times a day (BID) | ORAL | 1 refills | Status: DC

## 2019-06-30 MED ORDER — HYDROCHLOROTHIAZIDE 25 MG PO TABS: 25.0000 mg | ORAL_TABLET | Freq: Every day | ORAL | 1 refills | Status: DC

## 2019-06-30 MED ORDER — CITALOPRAM HYDROBROMIDE 20 MG PO TABS: 20.0000 mg | ORAL_TABLET | Freq: Every day | ORAL | 1 refills | Status: DC

## 2019-06-30 MED ORDER — ATORVASTATIN CALCIUM 40 MG PO TABS: 40.0000 mg | ORAL_TABLET | Freq: Every day | ORAL | 1 refills | Status: DC

## 2019-06-30 MED ORDER — METFORMIN HCL 500 MG PO TABS: 500.0000 mg | ORAL_TABLET | Freq: Two times a day (BID) | ORAL | 1 refills | Status: DC

## 2019-06-30 MED ORDER — LOSARTAN POTASSIUM 50 MG PO TABS: 50.0000 mg | ORAL_TABLET | Freq: Every day | ORAL | 1 refills | Status: DC

## 2019-06-30 NOTE — Progress Notes (Signed)
Subjective:    Patient ID: Deborah Simon, female    DOB: [birth year], 69 y.o.   MRN: [id number]  HPI Chief Complaint  Patient presents with  . other    new pt. est. tremor in rt. arm, pain in rt. side for the past week   She is new to the practice and here for a complete physical exam. Previous medical care: Dr. Parke Simmers. Last there 4 months ago.   Other providers: Neurologist - Dr. Frances Furbish    Complains of a one month history of memory issues. States she has been told by her son that she repeats herself.  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 States she does not misplace things.  Has been taking Prevagen OTC.  States she has a tremor in her right hand and this made her have to stop working.  States she saw a neurologist, Dr. Frances Furbish for this and has a follow up appointment on 07/22/2018.   Complains of intermittent pain on her ride side for the past 4-6 months. Sharp pain that is intermittent. Wakes up her occasionally. The pain moves up and down her side. Movement makes her pain worse. Laying on her side makes it worse. Tramadol improves pain temporarily.   No changes in bowel habits. Denies urinary symptoms.   Denies fever, chills, night sweats, unexplained weight loss, dizziness, chest pain, palpitations, shortness of breath, abdominal pain, N/V/D, urinary symptoms, LE edema.   Good appetite.    Diabetes- checks BS at home every other day.  Cannot recall readings or last Hgb A1c.  Taking Metformin once daily   HTN- does not check BP at home. No machine. Reports good compliance with medication.  Taking metoprolol, amlodipine, HCTZ, losartan  Social history: Lives with one of her sons and daughter in law [place]. Is not working. Retired from [employer]. Worked there for 11 years.  HS graduate.   Denies smoking, drinking alcohol, drug use   Eats veggies and salad. 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   States she is up to date on preventive care.   Denies history of anxiety or depression.    Reviewed allergies, medications, past medical, surgical, family, and social history.   Review of Systems Pertinent positives and negatives in the history of present illness.     Objective:   Physical Exam BP 130/82   Pulse 98   Temp 97.8 F (36.6 C)   Ht 5' 2.5" (1.588 m)   Wt 164 lb 12.8 oz (74.8 kg)   LMP  (LMP Unknown)   BMI 29.66 kg/m   Alert and oriented and in no distress.  Neck is supple without adenopathy or thyromegaly. Cardiac exam shows a regular rhythm without murmurs or gallops. Lungs are clear to auscultation. Abdomen is soft, non distended, normal BS, diffuse tenderness to light palpations to bilateral upper and right lower quadrants. Negative Murphy's and McBurneys. Neg psoas. LLE with mild non pitting edema, RLE without edema, bilateral calves symmetric and non tender. Pulses intact. Skin is warm and dry. PERRLA, EOMs intact. DTRs symmetric. Intermittent tremor to right hand noted.        Assessment & Plan:  Diabetes mellitus without complication (HCC) - Plan: CBC with Differential, Comprehensive metabolic panel, Hemoglobin A1c - pleasant 69 year old who is new to me today. Unknown last Hgb A1c. Check labs and follow up. Continue on metformin for now.  Will request medical records from previous PCP  Essential hypertension - Plan: CBC with Differential, Comprehensive metabolic panel -  controlled with current medication regimen. Low sodium diet.   Hyperlipidemia associated with type 2 diabetes mellitus ([place]) - Plan: Lipid Panel Check lipids and continue on statin   Intermittent tremor - Plan: TSH, T4, Free, Vitamin B12 -worked up by neurologist   Side pain -discussed that this does not appear to be anything worrisome. No obvious systemic symptoms. Most likely MSK. Review previous PCP records regarding this and follow up  Memory difficulty - Plan: TSH, T4, Free, Vitamin B12 -states her son tells her she repeats herself. Will get previous PCP records.  Encouraged her to discuss this with her neurologist at upcoming appt. Consider MMSE at next visit if not done by neuro  Wanted refills on Etodolac, Metformin, Atorvastatin, HCTZ, Citalopram, losartan- sent in 6 months

## 2019-06-30 NOTE — Patient Instructions (Signed)
It was a pleasure meeting you today.  Continue on your current medications for now.  I will be in touch with your results.  I will also request your medical records from your previous primary care provider.  I recommend that you discuss memory concerns with your neurologist as well.  There is no obvious explanation for the side pain you are having.  It does not appear to be anything serious but it does appear to be related to a musculoskeletal issue. I will review your records about this as well and determine what we need to do next.  I recommend that you follow-up here in 4 to 6 weeks depending on when I can get your previous medical records.

## 2019-07-01 LAB — COMPREHENSIVE METABOLIC PANEL
ALT: 23 IU/L (ref 0–32)
AST: 24 IU/L (ref 0–40)
Albumin/Globulin Ratio: 2.2 (ref 1.2–2.2)
Albumin: 5.2 g/dL — ABNORMAL HIGH (ref 3.8–4.8)
Alkaline Phosphatase: 116 IU/L (ref 39–117)
BUN/Creatinine Ratio: 14 (ref 12–28)
BUN: 16 mg/dL (ref 8–27)
Bilirubin Total: 0.3 mg/dL (ref 0.0–1.2)
CO2: 25 mmol/L (ref 20–29)
Calcium: 11.3 mg/dL — ABNORMAL HIGH (ref 8.7–10.3)
Chloride: 100 mmol/L (ref 96–106)
Creatinine, Ser: 1.14 mg/dL — ABNORMAL HIGH (ref 0.57–1.00)
GFR calc Af Amer: 57 mL/min/{1.73_m2} — ABNORMAL LOW (ref >59)
GFR calc non Af Amer: 50 mL/min/{1.73_m2} — ABNORMAL LOW (ref >59)
Globulin, Total: 2.4 g/dL (ref 1.5–4.5)
Glucose: 107 mg/dL — ABNORMAL HIGH (ref 65–99)
Potassium: 4.4 mmol/L (ref 3.5–5.2)
Sodium: 139 mmol/L (ref 134–144)
Total Protein: 7.6 g/dL (ref 6.0–8.5)

## 2019-07-01 LAB — CBC WITH DIFFERENTIAL/PLATELET
Basophils Absolute: 0 10*3/uL (ref 0.0–0.2)
Basos: 0 %
EOS (ABSOLUTE): 0.2 10*3/uL (ref 0.0–0.4)
Eos: 3 %
Hematocrit: 37 % (ref 34.0–46.6)
Hemoglobin: 12.2 g/dL (ref 11.1–15.9)
Immature Grans (Abs): 0 10*3/uL (ref 0.0–0.1)
Immature Granulocytes: 0 %
Lymphocytes Absolute: 2.6 10*3/uL (ref 0.7–3.1)
Lymphs: 37 %
MCH: 31 pg (ref 26.6–33.0)
MCHC: 33 g/dL (ref 31.5–35.7)
MCV: 94 fL (ref 79–97)
Monocytes Absolute: 0.7 10*3/uL (ref 0.1–0.9)
Monocytes: 10 %
Neutrophils Absolute: 3.5 10*3/uL (ref 1.4–7.0)
Neutrophils: 50 %
Platelets: 471 10*3/uL — ABNORMAL HIGH (ref 150–450)
RBC: 3.93 x10E6/uL (ref 3.77–5.28)
RDW: 14 % (ref 11.7–15.4)
WBC: 7.1 10*3/uL (ref 3.4–10.8)

## 2019-07-01 LAB — TSH: TSH: 1.44 u[IU]/mL (ref 0.450–4.500)

## 2019-07-01 LAB — LIPID PANEL
Chol/HDL Ratio: 2.8 ratio (ref 0.0–4.4)
Cholesterol, Total: 155 mg/dL (ref 100–199)
HDL: 56 mg/dL (ref >39)
LDL Chol Calc (NIH): 84 mg/dL (ref 0–99)
Triglycerides: 79 mg/dL (ref 0–149)
VLDL Cholesterol Cal: 15 mg/dL (ref 5–40)

## 2019-07-01 LAB — HEMOGLOBIN A1C
Est. average glucose Bld gHb Est-mCnc: 151 mg/dL
Hgb A1c MFr Bld: 6.9 % — ABNORMAL HIGH (ref 4.8–5.6)

## 2019-07-01 LAB — VITAMIN B12: Vitamin B-12: 960 pg/mL (ref 232–1245)

## 2019-07-01 LAB — T4, FREE: Free T4: 0.89 ng/dL (ref 0.82–1.77)

## 2019-07-05 ENCOUNTER — Other Ambulatory Visit: Payer: Self-pay

## 2019-07-05 ENCOUNTER — Ambulatory Visit (INDEPENDENT_AMBULATORY_CARE_PROVIDER_SITE_OTHER): Payer: Medicare Other | Admitting: Family Medicine

## 2019-07-05 ENCOUNTER — Encounter: Payer: Self-pay | Admitting: Family Medicine

## 2019-07-05 DIAGNOSIS — R7989 Other specified abnormal findings of blood chemistry: Secondary | ICD-10-CM | POA: Diagnosis not present

## 2019-07-05 DIAGNOSIS — Z9049 Acquired absence of other specified parts of digestive tract: Secondary | ICD-10-CM | POA: Insufficient documentation

## 2019-07-05 DIAGNOSIS — E1122 Type 2 diabetes mellitus with diabetic chronic kidney disease: Secondary | ICD-10-CM | POA: Insufficient documentation

## 2019-07-05 DIAGNOSIS — E049 Nontoxic goiter, unspecified: Secondary | ICD-10-CM | POA: Diagnosis not present

## 2019-07-05 DIAGNOSIS — E119 Type 2 diabetes mellitus without complications: Secondary | ICD-10-CM | POA: Diagnosis not present

## 2019-07-05 DIAGNOSIS — R109 Unspecified abdominal pain: Secondary | ICD-10-CM | POA: Diagnosis not present

## 2019-07-05 MED ORDER — TRAMADOL HCL 50 MG PO TABS: 50.0000 mg | ORAL_TABLET | Freq: Four times a day (QID) | ORAL | 0 refills | Status: DC | PRN

## 2019-07-05 NOTE — Progress Notes (Signed)
   Subjective:    Patient ID: Deborah Simon, female    DOB: 10/14/1950, 69 y.o.   MRN: [id number]  HPI Chief Complaint  Patient presents with  . follow-up    follow-up on abnormal labs   She is here to follow up on abnormal labs.  Hypercalcemia- denies history of this.   Diabetes- Hgb A1c 6.9% on 06/30/2019 and is on Metformin   Mildly elevated platelets. No previous labs results from PCP in past 3 years available but have been requested.   Continues to complain of right sided abdominal pain. This has been ongoing, intermittent. Taking muscle relaxants and Tramadol for pain. Requests refill of Tramadol.  Pain has been worked up by her previous PCP but I do not have records. No records of abdominal US.  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 States there is no reason for her to feel this way. No recent triggers but she is no longer working and sits at home most of the time now.  Denies feeling depressed.  Son and daughter in law near by and involved.  States she thinks she needs to get out of the house and get involved more so that she is not at home worrying and thinking about random things.  Is not interested in counseling right now.   Denies fever, chills, dizziness, chest pain, palpitations, shortness of breath, abdominal pain, N/V/D, urinary symptoms, LE edema.    Reviewed allergies, medications, past medical, surgical, family, and social history.   Review of Systems Pertinent positives and negatives in the history of present illness.     Objective:   Physical Exam BP 130/72   Pulse 100   Temp 99.3 F (37.4 C)   Wt 169 lb 3.2 oz (76.7 kg)   LMP  (LMP Unknown)   BMI 30.45 kg/m   Alert and in no distress.  Neck is supple without adenopathy. Enlarged thyroid gland on right, no nodules palpated. nontender.  Abdomen soft, non distended, normal BS, right upper quadrant and right side TTP without rebound or  guarding. No palpable masses.  Skin is warm and dry.        Assessment & Plan:  Hypercalcemia - Plan: Comprehensive metabolic panel, VITAMIN D 25 Hydroxy (Vit-D Deficiency, Fractures), PTH, Intact and Calcium -check labs to evaluate for hypercalcemia  Elevated platelet count - Plan: CBC with Differential/Platelet -mildly elevated. Recheck   Type 2 diabetes mellitus with chronic kidney disease, without long-term current use of insulin, unspecified CKD stage (HCC) -Hgb A1c 6.9% and controlled.   Right sided abdominal pain - Plan: US Abdomen Limited RUQ, traMADol (ULTRAM) 50 MG tablet -unclear etiology. Reviewed imaging. Awaiting records from previous PCP. Will get a RUQ [place]. Refilled Tramadol.   History of cholecystectomy  Enlarged thyroid gland- no palpable nodules. Normal thyroid function. No further testing at this time.   Anxiety- encouraged counseling and she declines. Also encouraged being more active and finding a purpose. Suspect she has too much idle time now that she is not working.

## 2019-07-05 NOTE — Patient Instructions (Signed)
I am ordering an ultrasound of your right upper abdomen to look for an underlying explanation for your pain.   We will be in touch with your lab and [place] results.

## 2019-07-06 LAB — PTH, INTACT AND CALCIUM: PTH: 30 pg/mL (ref 15–65)

## 2019-07-06 LAB — COMPREHENSIVE METABOLIC PANEL
ALT: 20 IU/L (ref 0–32)
AST: 24 IU/L (ref 0–40)
Albumin/Globulin Ratio: 2 (ref 1.2–2.2)
Albumin: 4.7 g/dL (ref 3.8–4.8)
Alkaline Phosphatase: 100 IU/L (ref 39–117)
BUN/Creatinine Ratio: 11 — ABNORMAL LOW (ref 12–28)
BUN: 11 mg/dL (ref 8–27)
Bilirubin Total: <0.2 mg/dL (ref 0.0–1.2)
CO2: 23 mmol/L (ref 20–29)
Calcium: 9.8 mg/dL (ref 8.7–10.3)
Chloride: 104 mmol/L (ref 96–106)
Creatinine, Ser: 0.96 mg/dL (ref 0.57–1.00)
GFR calc Af Amer: 70 mL/min/{1.73_m2} (ref >59)
GFR calc non Af Amer: 61 mL/min/{1.73_m2} (ref >59)
Globulin, Total: 2.4 g/dL (ref 1.5–4.5)
Glucose: 100 mg/dL — ABNORMAL HIGH (ref 65–99)
Potassium: 4.2 mmol/L (ref 3.5–5.2)
Sodium: 142 mmol/L (ref 134–144)
Total Protein: 7.1 g/dL (ref 6.0–8.5)

## 2019-07-06 LAB — CBC WITH DIFFERENTIAL/PLATELET
Basophils Absolute: 0 10*3/uL (ref 0.0–0.2)
Basos: 0 %
EOS (ABSOLUTE): 0.3 10*3/uL (ref 0.0–0.4)
Eos: 3 %
Hematocrit: 33.1 % — ABNORMAL LOW (ref 34.0–46.6)
Hemoglobin: 11.2 g/dL (ref 11.1–15.9)
Immature Grans (Abs): 0 10*3/uL (ref 0.0–0.1)
Immature Granulocytes: 0 %
Lymphocytes Absolute: 3.1 10*3/uL (ref 0.7–3.1)
Lymphs: 37 %
MCH: 31.5 pg (ref 26.6–33.0)
MCHC: 33.8 g/dL (ref 31.5–35.7)
MCV: 93 fL (ref 79–97)
Monocytes Absolute: 0.7 10*3/uL (ref 0.1–0.9)
Monocytes: 8 %
Neutrophils Absolute: 4.3 10*3/uL (ref 1.4–7.0)
Neutrophils: 52 %
Platelets: 404 10*3/uL (ref 150–450)
RBC: 3.56 x10E6/uL — ABNORMAL LOW (ref 3.77–5.28)
RDW: 14.3 % (ref 11.7–15.4)
WBC: 8.4 10*3/uL (ref 3.4–10.8)

## 2019-07-06 LAB — VITAMIN D 25 HYDROXY (VIT D DEFICIENCY, FRACTURES): Vit D, 25-Hydroxy: 69.8 ng/mL (ref 30.0–100.0)

## 2019-07-12 ENCOUNTER — Encounter: Payer: Self-pay | Admitting: Family Medicine

## 2019-07-12 ENCOUNTER — Ambulatory Visit
Admission: RE | Admit: 2019-07-12 | Discharge: 2019-07-12 | Disposition: A | Discharge status: Home / Self Care | Payer: Medicare Other | Source: Ambulatory Visit | Attending: Family Medicine | Admitting: Family Medicine

## 2019-07-12 DIAGNOSIS — G8929 Other chronic pain: Secondary | ICD-10-CM | POA: Insufficient documentation

## 2019-07-12 DIAGNOSIS — K760 Fatty (change of) liver, not elsewhere classified: Secondary | ICD-10-CM

## 2019-07-12 DIAGNOSIS — R1011 Right upper quadrant pain: Secondary | ICD-10-CM | POA: Insufficient documentation

## 2019-07-12 DIAGNOSIS — R109 Unspecified abdominal pain: Secondary | ICD-10-CM

## 2019-07-12 HISTORY — DX: Fatty (change of) liver, not elsewhere classified: K76.0

## 2019-08-11 ENCOUNTER — Ambulatory Visit (INDEPENDENT_AMBULATORY_CARE_PROVIDER_SITE_OTHER): Payer: Medicare Other | Admitting: Family Medicine

## 2019-08-11 ENCOUNTER — Encounter: Payer: Self-pay | Admitting: Family Medicine

## 2019-08-11 ENCOUNTER — Other Ambulatory Visit: Payer: Self-pay

## 2019-08-11 VITALS — BP 130/70 | HR 87 | Temp 99.1°F | Wt 164.0 lb

## 2019-08-11 DIAGNOSIS — R269 Unspecified abnormalities of gait and mobility: Secondary | ICD-10-CM | POA: Diagnosis not present

## 2019-08-11 DIAGNOSIS — R292 Abnormal reflex: Secondary | ICD-10-CM

## 2019-08-11 DIAGNOSIS — G252 Other specified forms of tremor: Secondary | ICD-10-CM

## 2019-08-11 DIAGNOSIS — R413 Other amnesia: Secondary | ICD-10-CM | POA: Diagnosis not present

## 2019-08-11 NOTE — Patient Instructions (Signed)
I will contact your neurologist, Dr. Frances Furbish, and let him know that your tremor seems to be getting worse.  I will also mention the fact that you feel that your memory is worsening as well. If you have not heard from his office by next Wednesday, let me know.

## 2019-08-11 NOTE — Progress Notes (Signed)
Subjective:    Patient ID: Deborah Simon, female    DOB: Dec 16, 1950, 69 y.o.   MRN: [id number]  HPI Chief Complaint  Patient presents with  . right arm shaking    right arm shaking. been going on for a month, no pain, no numbness   Here with complaints of right hand tremor that is getting worse over the past 2 weeks. Worse in the mornings for the past week when she is not stressed or anxious. States this upsets her so then she gets upset and it seems to make the tremor worse.  She did not realize her left hand also had a tremor but it does in the office today.  States she had to stop working 3 weeks ago, retired. 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  States her memory is getting worse. States her family members are telling her that she says the same thing over and over.   States her son drove her today. States she is not driving any longer but denies getting lost.   States she is taking OTC memory medication, Prevagen.   Dr. Rexene Alberts is her neurologist and has evaluated her for tremor and memory issue. States she has a follow up scheduled in April with him.   MRI and labs done in 04/2019.   Denies fever, chills, dizziness, headache, vision changes, chest pain, palpitations, shortness of breath, abdominal pain, N/V/D, urinary symptoms, LE edema.   Reviewed allergies, medications, past medical, surgical, family, and social history.   Review of Systems Pertinent positives and negatives in the history of present illness.     Objective:   Physical Exam Constitutional:      General: She is not in acute distress.    Appearance: She is not ill-appearing.  Eyes:     General: No visual field deficit. Cardiovascular:     Rate and Rhythm: Normal rate and regular rhythm.     Pulses: Normal pulses.  Pulmonary:     Effort: Pulmonary effort is normal.     Breath sounds: Normal breath sounds.  Musculoskeletal:     Cervical back: Normal range  of motion and neck supple.  Neurological:     Mental Status: She is alert.     Cranial Nerves: No facial asymmetry.     Sensory: Sensation is intact.     Motor: Tremor present. No weakness or pronator drift.     Deep Tendon Reflexes: Reflexes abnormal.     Comments: Hyporeflexia left patellar and achilles with questionable clonus on left.  Difficulty with finger to nose and heel to shin due to tremor and legs shaking Gait is slightly abnormal, no shuffling   Psychiatric:        Attention and Perception: Attention normal.        Speech: Speech normal.        Behavior: Behavior normal. Behavior is cooperative.        Thought Content: Thought content normal.        Cognition and Memory: She exhibits impaired recent memory.    BP 130/70   Pulse 87   Temp 99.1 F (37.3 C)   Wt 164 lb (74.4 kg)   LMP  (LMP Unknown)   SpO2 98%   BMI 29.52 kg/m       Assessment & Plan:  Intention tremor  Memory difficulty  Hyporeflexia  Gait abnormality  MMSE 22  She is still fairly new to me.  Tremor seems much worse than previous visit and more frequent including her left hand now. She reports tremor now first thing in the morning when she is not feeling anxious or stressed. No longer able to do her job so she "retired".  Concerning that her neurological symptoms including tremor, gait and memory are worsening, I will refer her back to Dr. Frances Furbish. She is aware.

## 2019-09-23 ENCOUNTER — Telehealth: Payer: Self-pay | Admitting: Gastroenterology

## 2019-09-23 ENCOUNTER — Telehealth: Payer: Self-pay | Admitting: Family Medicine

## 2019-09-23 NOTE — Telephone Encounter (Signed)
Pt has a a ppt next week according to the pt

## 2019-09-23 NOTE — Telephone Encounter (Signed)
Pt reported that she is experiencing abdominal pain.  Please call back to advise.

## 2019-09-23 NOTE — Telephone Encounter (Signed)
Left message on machine to call back  

## 2019-09-23 NOTE — Telephone Encounter (Signed)
Pt called back and she is going to give [facility] a call to schedule

## 2019-09-23 NOTE — Telephone Encounter (Signed)
It looks like she saw Dr. Myrtie Neither at [facility] in January 2020 so she should be able to call and schedule a visit. Let me know if not. Thanks.

## 2019-09-23 NOTE — Telephone Encounter (Signed)
Pt called and is requesting a referral for a gastroenterologist states she is still having the pain in her side. Pt can be reached at [phone]

## 2019-09-27 NOTE — Telephone Encounter (Signed)
Pt did not return call. 

## 2019-09-28 ENCOUNTER — Other Ambulatory Visit: Payer: Self-pay

## 2019-09-28 ENCOUNTER — Encounter: Payer: Self-pay | Admitting: Adult Health

## 2019-09-28 ENCOUNTER — Encounter: Payer: Self-pay | Admitting: Family Medicine

## 2019-09-28 ENCOUNTER — Ambulatory Visit (INDEPENDENT_AMBULATORY_CARE_PROVIDER_SITE_OTHER): Payer: Medicare Other | Admitting: Family Medicine

## 2019-09-28 ENCOUNTER — Ambulatory Visit (INDEPENDENT_AMBULATORY_CARE_PROVIDER_SITE_OTHER): Payer: Medicare Other | Admitting: Adult Health

## 2019-09-28 VITALS — BP 180/100 | HR 84 | Temp 98.7°F | Ht 62.0 in | Wt 170.8 lb

## 2019-09-28 VITALS — BP 132/80 | HR 85 | Temp 97.5°F | Ht 63.0 in | Wt 168.0 lb

## 2019-09-28 DIAGNOSIS — M62838 Other muscle spasm: Secondary | ICD-10-CM

## 2019-09-28 DIAGNOSIS — R109 Unspecified abdominal pain: Secondary | ICD-10-CM | POA: Diagnosis not present

## 2019-09-28 DIAGNOSIS — M545 Low back pain, unspecified: Secondary | ICD-10-CM

## 2019-09-28 DIAGNOSIS — R413 Other amnesia: Secondary | ICD-10-CM

## 2019-09-28 DIAGNOSIS — R251 Tremor, unspecified: Secondary | ICD-10-CM | POA: Diagnosis not present

## 2019-09-28 LAB — POCT URINALYSIS DIP (PROADVANTAGE DEVICE)
Blood, UA: NEGATIVE
Glucose, UA: NEGATIVE mg/dL
Ketones, POC UA: NEGATIVE mg/dL
Leukocytes, UA: NEGATIVE
Nitrite, UA: NEGATIVE
Protein Ur, POC: NEGATIVE mg/dL
Specific Gravity, Urine: 1.015
Urobilinogen, Ur: NEGATIVE
pH, UA: 6 (ref 5.0–8.0)

## 2019-09-28 MED ORDER — BACLOFEN 10 MG PO TABS: 10.0000 mg | ORAL_TABLET | Freq: Three times a day (TID) | ORAL | 0 refills | Status: DC | PRN

## 2019-09-28 MED ORDER — KETOROLAC TROMETHAMINE 60 MG/2ML IM SOLN
60.0000 mg | Freq: Once | INTRAMUSCULAR | Status: AC
  Administered 2019-09-28: 17:00:00 60 mg via INTRAMUSCULAR

## 2019-09-28 MED ORDER — DONEPEZIL HCL 5 MG PO TABS: 5.0000 mg | ORAL_TABLET | Freq: Every day | ORAL | 5 refills | Status: DC

## 2019-09-28 NOTE — Patient Instructions (Signed)
Your Plan:  Start Aricept 5 mg at bedtime Referral to neuropsychological testing Blood work today If your symptoms worsen or you develop new symptoms please let us know.    Thank you for coming to see [place] at [facility]. I hope we have been able to provide you high quality care today.  You may receive a patient satisfaction survey over the next few weeks. We would appreciate your feedback and comments so that we may continue to improve ourselves and the health of our patients.  Donepezil tablets What is this medicine? DONEPEZIL (doe NEP e zil) is used to treat mild to moderate dementia caused by Alzheimer's disease. This medicine may be used for other purposes; ask your health care provider or pharmacist if you have questions. COMMON BRAND NAME(S): Aricept What should I tell my health care provider before I take this medicine? They need to know if you have any of these conditions:  asthma or other lung disease  difficulty passing urine  head injury  heart disease  history of irregular heartbeat  liver disease  seizures (convulsions)  stomach or intestinal disease, ulcers or stomach bleeding  an unusual or allergic reaction to donepezil, other medicines, foods, dyes, or preservatives  pregnant or trying to get pregnant  breast-feeding How should I use this medicine? Take this medicine by mouth with a glass of water. Follow the directions on the prescription label. You may take this medicine with or without food. Take this medicine at regular intervals. This medicine is usually taken before bedtime. Do not take it more often than directed. Continue to take your medicine even if you feel better. Do not stop taking except on your doctor's advice. If you are taking the 23 mg donepezil tablet, swallow it whole; do not cut, crush, or chew it. Talk to your pediatrician regarding the use of this medicine in children. Special care may be needed. Overdosage: If you  think you have taken too much of this medicine contact a poison control center or emergency room at once. NOTE: This medicine is only for you. Do not share this medicine with others. What if I miss a dose? If you miss a dose, take it as soon as you can. If it is almost time for your next dose, take only that dose, do not take double or extra doses. What may interact with this medicine? Do not take this medicine with any of the following medications:  certain medicines for fungal infections like itraconazole, fluconazole, posaconazole, and voriconazole  cisapride  dextromethorphan; quinidine  dronedarone  pimozide  quinidine  thioridazine This medicine may also interact with the following medications:  antihistamines for allergy, cough and cold  atropine  bethanechol  carbamazepine  certain medicines for bladder problems like oxybutynin, tolterodine  certain medicines for Parkinson's disease like benztropine, trihexyphenidyl  certain medicines for stomach problems like dicyclomine, hyoscyamine  certain medicines for travel sickness like scopolamine  dexamethasone  dofetilide  ipratropium  NSAIDs, medicines for pain and inflammation, like ibuprofen or naproxen  other medicines for Alzheimer's disease  other medicines that prolong the QT interval (cause an abnormal heart rhythm)  phenobarbital  phenytoin  rifampin, rifabutin or rifapentine  ziprasidone This list may not describe all possible interactions. Give your health care provider a list of all the medicines, herbs, non-prescription drugs, or dietary supplements you use. Also tell them if you smoke, drink alcohol, or use illegal drugs. Some items may interact with your medicine. What should I watch for  while using this medicine? Visit your doctor or health care professional for regular checks on your progress. Check with your doctor or health care professional if your symptoms do not get better or if they  get worse. You may get drowsy or dizzy. Do not drive, use machinery, or do anything that needs mental alertness until you know how this drug affects you. What side effects may I notice from receiving this medicine? Side effects that you should report to your doctor or health care professional as soon as possible:  allergic reactions like skin rash, itching or hives, swelling of the face, lips, or tongue  feeling faint or lightheaded, falls  loss of bladder control  seizures  signs and symptoms of a dangerous change in heartbeat or heart rhythm like chest pain; dizziness; fast or irregular heartbeat; palpitations; feeling faint or lightheaded, falls; breathing problems  signs and symptoms of infection like fever or chills; cough; sore throat; pain or trouble passing urine  signs and symptoms of liver injury like dark yellow or brown urine; general ill feeling or flu-like symptoms; light-colored stools; loss of appetite; nausea; right upper belly pain; unusually weak or tired; yellowing of the eyes or skin  slow heartbeat or palpitations  unusual bleeding or bruising  vomiting Side effects that usually do not require medical attention (report to your doctor or health care professional if they continue or are bothersome):  diarrhea, especially when starting treatment  headache  loss of appetite  muscle cramps  nausea  stomach upset This list may not describe all possible side effects. Call your doctor for medical advice about side effects. You may report side effects to FDA at 1-800-FDA-1088. Where should I keep my medicine? Keep out of reach of children. Store at room temperature between 15 and 30 degrees C (59 and 86 degrees F). Throw away any unused medicine after the expiration date. NOTE: This sheet is a summary. It may not cover all possible information. If you have questions about this medicine, talk to your doctor, pharmacist, or health care provider.  2020  [employer]vier/Gold Standard (2018-05-24 10:33:41)

## 2019-09-28 NOTE — Progress Notes (Addendum)
  PATIENT: Deborah Simon DOB: 03/14/1951  REASON FOR VISIT: follow up HISTORY FROM: patient  HISTORY OF PRESENT ILLNESS: Today 09/28/19:  Deborah Simon is a 68-year-old female with a history of intermittent tremor the returns today for follow-up.  Her main concern today is memory disturbance.  She states that she has some issues with word finding.  She lives at home with her son.  She is able to complete all ADLs independently.  She manages her own finances without difficulty.  She manages her own medications and appointments.  She does use a pillbox and references a calendar.  She is able to shop independently for her needs but the granddaughter reports that she does need a list.  No change in mood or behavior.  Patient returns today for an evaluation.  HISTORY Deborah Simon is a 68-year-old right-handed woman with an underlying medical history of hypertension, hyperlipidemia, type 2 diabetes, and overweight state, who reports an intermittent right hand tremor for the past 4 weeks or so.  Symptoms came on fairly suddenly.  They are primarily related to using her right hand and arm.  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  She has quite a bit of stress at work and is contemplating retiring.  She does better at home.  She gets tearful at times because of stress and trembling and it helps to calm her down by talking to her.  Her son has been helpful in that regard and has been able to calm her down.  Rubbing her hands on her thighs helps calm her down.  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  She does not drink caffeine on a day-to-day basis, she does not drink alcohol and is a non-smoker.  She tries to hydrate well, drinks about 2 bottles of 16 ounce  water per day at work and also water at home.  She lives with one of her 2 sons who are grown.  She has not had any treatment for anxiety or stress. I reviewed your office note from 03/11/2019, which you kindly included.  She had blood work in your office on 03/11/2019 and I reviewed the results: Lipid panel showed benign findings with a total cholesterol of 139, HDL 60, triglycerides 54 and LDL 66.  CMP showed glucose mildly elevated at 120, BUN 19, creatinine 1.14, otherwise normal findings, CBC with differential showed WBC of 6.5, hemoglobin 11.4 which is mildly low and hematocrit 33.7 which is also mildly low, platelets mildly elevated at 439, otherwise benign findings, A1c was mildly elevated at 6.6. She denies a family history of tremor or Parkinson's disease.  She has not fallen.  She has an eye examination once a year with Dr. Shapiro and is due for an appointment.  REVIEW OF SYSTEMS: Out of a complete 14 system review of symptoms, the patient complains only of the following symptoms, and all other reviewed systems are negative.  See HPI  ALLERGIES: Allergies  Allergen Reactions  . Omeprazole Other (See Comments)    headache  . Ace Inhibitors Swelling    Angioedema   . Azithromycin Swelling  . Lisinopril Swelling  . Penicillins Other (See Comments)    Note  Sure rash   of some sort probably, nothing airway related    HOME MEDICATIONS: Outpatient Medications Prior to Visit  Medication Sig Dispense Refill  . amLODipine (NORVASC) 5 MG tablet Take 5 mg by mouth daily.    Marland Kitchen aspirin EC 81 MG tablet Take 81 mg by mouth daily.    Marland Kitchen atorvastatin (LIPITOR) 40 MG tablet Take 1 tablet (40 mg total) by mouth daily. 90 tablet 1  . baclofen (LIORESAL) 10 MG tablet Take 10 mg by mouth 2 (two) times daily.    . Blood Glucose Monitoring Suppl (FREESTYLE FREEDOM LITE) W/DEVICE KIT 1 Units by Does not apply route once. Use to test blood glucose once a day 1 each 0  . cetirizine (ZYRTEC) 10 MG tablet  Take 10 mg by mouth daily.    . Chlorphen-PE-Acetaminophen (NOREL AD) 4-10-325 MG TABS Take by mouth.    . citalopram (CELEXA) 20 MG tablet Take 1 tablet (20 mg total) by mouth daily. 90 tablet 1  . cyclobenzaprine (FLEXERIL) 10 MG tablet Take 10 mg by mouth 3 (three) times daily.    Marland Kitchen docusate sodium (COLACE) 100 MG capsule Take 100 mg by mouth 2 (two) times daily as needed for mild constipation.    Marland Kitchen EPINEPHrine 0.3 mg/0.3 mL IJ SOAJ injection If shortness of breath develops, inject into thigh  then call 911.  May repeat in 5 minutes if no improvement. 2 Device 0  . etodolac (LODINE) 400 MG tablet Take 1 tablet (400 mg total) by mouth 2 (two) times daily. 180 tablet 1  . ferrous sulfate 325 (65 FE) MG tablet Take 325 mg by mouth daily with breakfast.    . hydrochlorothiazide (HYDRODIURIL) 25 MG tablet Take 1 tablet (25 mg total) by mouth daily. 90 tablet 1  . losartan (COZAAR) 50 MG tablet Take 1 tablet (50 mg total) by mouth daily. 90 tablet 1  . meloxicam (MOBIC) 15 MG tablet meloxicam 15 mg tablet  TAKE 1 TABLET(S) EVERY DAY BY ORAL ROUTE FOR 30 DAYS.    Marland Kitchen metoprolol succinate (TOPROL-XL) 25 MG 24 hr tablet TAKE 1 TABLET BY MOUTH DAILY 90 tablet 0  . montelukast (SINGULAIR) 10 MG tablet Take 10 mg by mouth at bedtime.    . ranitidine (ZANTAC) 150 MG capsule Take 150 mg by mouth 2 (two) times daily.    . traMADol (ULTRAM) 50 MG tablet Take 1 tablet (50 mg total) by mouth every 6 (six) hours as needed. 20 tablet 0  . metFORMIN (GLUCOPHAGE) 500 MG tablet Take 1 tablet (500 mg total) by mouth 2 (two) times daily with a meal. (Patient not taking: Reported on 08/11/2019) 180 tablet 1   Facility-Administered Medications Prior to Visit  Medication Dose Route Frequency Provider Last Rate Last Admin  . 0.9 %  sodium chloride infusion  500 mL Intravenous Once Doran Stabler, MD        PAST MEDICAL HISTORY: Past Medical History:  Diagnosis Date  . Diabetes mellitus   . Fatty liver 07/12/2019   . Hyperlipidemia   . Hypertension     PAST SURGICAL HISTORY: Past Surgical History:  Procedure Laterality Date  . CHOLECYSTECTOMY  10/07/11   Single site  . COLONOSCOPY  03/27/2008   Dr.Patterson-normal    FAMILY HISTORY: Family History  Problem Relation Age of Onset  . Stroke Mother   . Stroke Father   . Colon cancer Neg Hx   . Colon polyps Neg Hx   . Esophageal cancer Neg Hx   .  Stomach cancer Neg Hx   . Rectal cancer Neg Hx     SOCIAL HISTORY: Social History   Socioeconomic History  . Marital status: Single    Spouse name: Not on file  . Number of children: Not on file  . Years of education: Not on file  . Highest education level: Not on file  Occupational History  . Not on file  Tobacco Use  . Smoking status: Never Smoker  . Smokeless tobacco: Never Used  Substance and Sexual Activity  . Alcohol use: No  . Drug use: No  . Sexual activity: Not Currently  Other Topics Concern  . Not on file  Social History Narrative  . Not on file   Social Determinants of Health   Financial Resource Strain:   . Difficulty of Paying Living Expenses:   Food Insecurity:   . Worried About Charity fundraiser in the Last Year:   . Arboriculturist in the Last Year:   Transportation Needs:   . Film/video editor (Medical):   Marland Kitchen Lack of Transportation (Non-Medical):   Physical Activity:   . Days of Exercise per Week:   . Minutes of Exercise per Session:   Stress:   . Feeling of Stress :   Social Connections:   . Frequency of Communication with Friends and Family:   . Frequency of Social Gatherings with Friends and Family:   . Attends Religious Services:   . Active Member of Clubs or Organizations:   . Attends Archivist Meetings:   Marland Kitchen Marital Status:   Intimate Partner Violence:   . Fear of Current or Ex-Partner:   . Emotionally Abused:   Marland Kitchen Physically Abused:   . Sexually Abused:       PHYSICAL EXAM  Vitals:   09/28/19 1130  BP: 132/80  Pulse:  85  Temp: (!) 97.5 F (36.4 C)  Weight: 168 lb (76.2 kg)  Height: 5' 3" (1.6 m)   Body mass index is 29.76 kg/m.  Generalized: Well developed, in no acute distress   Neurological examination  Mentation: Alert oriented to time, place, history taking. Follows all commands speech and language fluent Cranial nerve II-XII: Pupils were equal round reactive to light. Extraocular movements were full, visual field were full on confrontational test.  Head turning and shoulder shrug  were normal and symmetric. Motor: The motor testing reveals 5 over 5 strength of all 4 extremities. Good symmetric motor tone is noted throughout.  Sensory: Sensory testing is intact to soft touch on all 4 extremities. No evidence of extinction is noted.  Coordination: Cerebellar testing reveals good finger-nose-finger and heel-to-shin bilaterally. Mild tremor in the upper extremities with finger-nose-finger Gait and station: Gait is normal.  Reflexes: Deep tendon reflexes are symmetric and normal bilaterally.   DIAGNOSTIC DATA (LABS, IMAGING, TESTING) - I reviewed patient records, labs, notes, testing and imaging myself where available.  Lab Results  Component Value Date   WBC 8.4 07/05/2019   HGB 11.2 07/05/2019   HCT 33.1 (L) 07/05/2019   MCV 93 07/05/2019   PLT 404 07/05/2019      Component Value Date/Time   NA 142 07/05/2019 1441   K 4.2 07/05/2019 1441   CL 104 07/05/2019 1441   CO2 23 07/05/2019 1441   GLUCOSE 100 (H) 07/05/2019 1441   GLUCOSE 120 (H) 11/16/2015 1008   BUN 11 07/05/2019 1441   CREATININE 0.96 07/05/2019 1441   CREATININE 1.00 (H) 11/16/2015 1008   CALCIUM  9.8 07/05/2019 1441   PROT 7.1 07/05/2019 1441   ALBUMIN 4.7 07/05/2019 1441   AST 24 07/05/2019 1441   ALT 20 07/05/2019 1441   ALKPHOS 100 07/05/2019 1441   BILITOT <0.2 07/05/2019 1441   GFRNONAA 61 07/05/2019 1441   GFRNONAA 60 11/16/2015 1008   GFRAA 70 07/05/2019 1441   GFRAA 69 11/16/2015 1008   Lab Results   Component Value Date   CHOL 155 06/30/2019   HDL 56 06/30/2019   LDLCALC 84 06/30/2019   LDLDIRECT 131 (H) 09/01/2011   TRIG 79 06/30/2019   CHOLHDL 2.8 06/30/2019   Lab Results  Component Value Date   HGBA1C 6.9 (H) 06/30/2019   Lab Results  Component Value Date   VITAMINB12 960 06/30/2019   Lab Results  Component Value Date   TSH 1.440 06/30/2019      ASSESSMENT AND PLAN 69 y.o. year old female  has a past medical history of Diabetes mellitus, Fatty liver (07/12/2019), Hyperlipidemia, and Hypertension. here with:  1.  Memory disturbance  -MMSE 23 out of 30 -Send for neuropsychological testing -Start Aricept 5 mg at bedtime reviewed medication with the patient and provided her with a handout -Blood work today  2.  Intermittent tremor  -Could be exacerbated by anxiety? -Continue to monitor for now  Advised if symptoms worsen or she develops new symptoms she should let us know.   I spent 30 minutes of face-to-face and non-face-to-face time with patient.  This included previsit chart review, lab review, study review, order entry, electronic health record documentation, patient education.  Ward Givens, MSN, NP-C 09/28/2019, 12:26 PM [facility] [address] [place], [place] [address] [phone]  I reviewed the above note and documentation by the Nurse Practitioner and agree with the history, exam, assessment and plan as outlined above. I was available for consultation. Star Age, MD, PhD [facility])

## 2019-09-28 NOTE — Patient Instructions (Signed)
Your pain seems to have a muscular component, starting at the lower ribs in the back, and extending into the muscles (or soft tissues).  We gave an injection of toradol today, which is an anti-inflammatory.  I hope that it gives you some relief. You must wait 6 hours after this injection before taking any etodolac. I recommend waiting and restarting the etodolac tomorrow morning with breakfast, and taking it twice daily (with food, breakfast and dinner) over the next week to see if you abdominal and back pain resolve. You may continue to use heat and topical medications (ie icy hot, biofreeze, etc).  You may restart using the baclofen muscle relaxant.  We sent in a refill of #30 pills. You can take this UP TO 3 times daily if needed for muscle spasm/pain.  See the gastroenterologist as scheduled next week.  They can re-assess at that time, and determine if further abdominal evaluation needs to be done (ie CT scan or other imaging). The ultrasound that was done in January only looked at the right upper quadrant, not lower into the abdomen.

## 2019-09-28 NOTE — Progress Notes (Signed)
Chief Complaint  Patient presents with  . Back Pain    right sided and abdominal. Feels like something is moving in her abdomen. Very painful. Has been happening for about a month.    Accompanied by her granddaughter, who also contacted the patient's daughter who was at the home, for additional information confirming medications.  Patient presents today with complaint of pain at her R lower back which radiates around to her abdomen. She reports today that the discomfort is 10/10. She has had pain lingering for over a month--review of chart shows pain goes back to January (or longer).  She describes the pain as throbbing, also as constant and achey.  She got referral to GI after last visit, scheduled for 4/22.  [place] 07/12/19 showed fatty liver. [place] was limited to RUQ.  She is s/p cholecystectomy.  She reports that she had tried topical creams, heating pad, advil. All helped some, but only temporarily. Hurts to lay on the right side in bed. Doesn't hurt when she walks, throbs when at rest. No pain with reaching/bending or other movements. PMH, PSH, SH reviewed  Review of chart-- Cr 0.96 in January Lab Results  Component Value Date   HGBA1C 6.9 (H) 06/30/2019    Baclofen--rx'd in 06/2019 for this pain.  Only uses it prn.  "Not taking it recently, didn't help much" When spoke with daughter, it was reported that they used this prn, was somewhat helpful, and that they ran out last week. Etodolac was also on med list--per daughter, she is taking this at night (med was at sink with other nighttime meds)   Outpatient Encounter Medications as of 09/28/2019  Medication Sig Note  . amLODipine (NORVASC) 5 MG tablet Take 5 mg by mouth daily.   Marland Kitchen aspirin EC 81 MG tablet Take 81 mg by mouth daily. 08/18/2014: .  . atorvastatin (LIPITOR) 40 MG tablet Take 1 tablet (40 mg total) by mouth daily.   . Blood Glucose Monitoring Suppl (FREESTYLE FREEDOM LITE) W/DEVICE KIT 1 Units by Does not apply route once.  Use to test blood glucose once a day   . citalopram (CELEXA) 20 MG tablet Take 1 tablet (20 mg total) by mouth daily.   . ferrous sulfate 325 (65 FE) MG tablet Take 325 mg by mouth daily with breakfast.   . hydrochlorothiazide (HYDRODIURIL) 25 MG tablet Take 1 tablet (25 mg total) by mouth daily.   Marland Kitchen ibuprofen (ADVIL) 200 MG tablet Take 400 mg by mouth every 6 (six) hours as needed. 09/28/2019: Last dose 9am  . losartan (COZAAR) 50 MG tablet Take 1 tablet (50 mg total) by mouth daily.   . metoprolol succinate (TOPROL-XL) 25 MG 24 hr tablet TAKE 1 TABLET BY MOUTH DAILY   . ranitidine (ZANTAC) 150 MG capsule Take 150 mg by mouth 2 (two) times daily.   . baclofen (LIORESAL) 10 MG tablet Take 1 tablet (10 mg total) by mouth 3 (three) times daily as needed for muscle spasms.   . cetirizine (ZYRTEC) 10 MG tablet Take 10 mg by mouth daily.   Marland Kitchen docusate sodium (COLACE) 100 MG capsule Take 100 mg by mouth 2 (two) times daily as needed for mild constipation.   Marland Kitchen donepezil (ARICEPT) 5 MG tablet Take 1 tablet (5 mg total) by mouth at bedtime. (Patient not taking: Reported on 09/28/2019)   . EPINEPHrine 0.3 mg/0.3 mL IJ SOAJ injection If shortness of breath develops, inject into thigh  then call 911.  May repeat in 5 minutes  if no improvement. (Patient not taking: Reported on 09/28/2019)   . metFORMIN (GLUCOPHAGE) 500 MG tablet Take 1 tablet (500 mg total) by mouth 2 (two) times daily with a meal. (Patient not taking: Reported on 08/11/2019)   . montelukast (SINGULAIR) 10 MG tablet Take 10 mg by mouth at bedtime.   . [DISCONTINUED] baclofen (LIORESAL) 10 MG tablet Take 10 mg by mouth 2 (two) times daily.   . [DISCONTINUED] Chlorphen-PE-Acetaminophen (NOREL AD) 4-10-325 MG TABS Take by mouth.   . [DISCONTINUED] cyclobenzaprine (FLEXERIL) 10 MG tablet Take 10 mg by mouth 3 (three) times daily.   . [DISCONTINUED] etodolac (LODINE) 400 MG tablet Take 1 tablet (400 mg total) by mouth 2 (two) times daily.   .  [DISCONTINUED] meloxicam (MOBIC) 15 MG tablet meloxicam 15 mg tablet  TAKE 1 TABLET(S) EVERY DAY BY ORAL ROUTE FOR 30 DAYS.   . [DISCONTINUED] traMADol (ULTRAM) 50 MG tablet Take 1 tablet (50 mg total) by mouth every 6 (six) hours as needed.    Facility-Administered Encounter Medications as of 09/28/2019  Medication  . 0.9 %  sodium chloride infusion  . [COMPLETED] ketorolac (TORADOL) injection 60 mg   (was out of baclofen since last week; had been taking etodolac just once in the evening)  Allergies  Allergen Reactions  . Omeprazole Other (See Comments)    headache  . Ace Inhibitors Swelling    Angioedema   . Azithromycin Swelling  . Lisinopril Swelling  . Penicillins Other (See Comments)    Note  Sure rash of some sort probably, nothing airway related   ROS: No fever chills, URI symptoms, headache, shortness of breath. No nausea, vomiting, bowel changes. +back pain and abdominal pain per HPI. No urinary complaints, hematuria. See HPI  PHYSICAL EXAM: BP (!) 180/100   Pulse 84   Temp 98.7 F (37.1 C) (Tympanic)   Ht 5' 2" (1.575 m)   Wt 170 lb 12.8 oz (77.5 kg)   LMP  (LMP Unknown)   BMI 31.24 kg/m   Pleasant, overweight female, pacing in the room due to discomfort (felt better moving than being seated). She didn't appear to be too uncomfortable during exam. HEENT: conjunctiva and sclera are clear, EOMI. Wearing mask Neck: no lymphadenopathy or mass Heart: regular rate and rhythm Lungs: clear bilaterally Back: no spinal or CVA tenderness. She is tender along inferior ribs on the right posteriorly and laterally, extending to floating ribs laterally/anteriorly. She is also tender extending into the soft tissues below the ribs, and in the right abdomen (mid portion). She had some discomfort to RLQ upon palpation, but this was not consistent. No rebound tenderness or guarding, No masses. Normal bowel sounds. Neuro: alert, normal strength, gait Skin: normal turgor, no  visible rash Psych: normal mood, affect, grooming  Urine: SG 1.015, large bilirubin, otherwise normal  ASSESSMENT/PLAN:  Right low back pain, unspecified chronicity, unspecified whether sciatica present - starts at lower thoracic ribs, extends into soft tissues. Suspect MSK. Etodolac, baclofen, heat at home; toradol given in office.  - Plan: POCT Urinalysis DIP (Proadvantage Device), ketorolac (TORADOL) injection 60 mg  Abdominal pain, unspecified abdominal location - Also suspect MSK, responds to heat, NSAID. Etodolac, baclofen, heat. f/u with GI as planned - Plan: POCT Urinalysis DIP (Proadvantage Device), ketorolac (TORADOL) injection 60 mg  Muscle spasm - Plan: baclofen (LIORESAL) 10 MG tablet, ketorolac (TORADOL) injection 60 mg  Toradol 5m IM given.   MSK vs intra-abdominal etiology for her pain [place] in January was limited  to RUQ, not adequately assessing where pain currently is.  Trial of NSAID, different muscle relaxant, continued heat and topical meds. Has GI appt next week; may need CT, will await their assessment and try these treatments first.  Risks/SE and how to properly take meds reviewed in detail. All questions answered.

## 2019-10-03 ENCOUNTER — Ambulatory Visit: Payer: Self-pay | Admitting: Adult Health

## 2019-10-04 ENCOUNTER — Telehealth: Payer: Self-pay | Admitting: *Deleted

## 2019-10-04 NOTE — Telephone Encounter (Signed)
-----   Message from Butch Penny, NP sent at 10/04/2019 10:08 AM EDT ----- Labs results are unremarkable. Please call patient with results.

## 2019-10-05 ENCOUNTER — Other Ambulatory Visit: Payer: Self-pay

## 2019-10-05 ENCOUNTER — Emergency Department (HOSPITAL_COMMUNITY)
Admission: EM | Admit: 2019-10-05 | Discharge: 2019-10-05 | Disposition: A | Discharge status: Home / Self Care | Payer: Medicare Other | Attending: Emergency Medicine | Admitting: Emergency Medicine

## 2019-10-05 ENCOUNTER — Encounter (HOSPITAL_COMMUNITY): Payer: Self-pay | Admitting: Emergency Medicine

## 2019-10-05 DIAGNOSIS — E1122 Type 2 diabetes mellitus with diabetic chronic kidney disease: Secondary | ICD-10-CM | POA: Insufficient documentation

## 2019-10-05 DIAGNOSIS — M5489 Other dorsalgia: Secondary | ICD-10-CM | POA: Diagnosis not present

## 2019-10-05 DIAGNOSIS — R52 Pain, unspecified: Secondary | ICD-10-CM | POA: Diagnosis not present

## 2019-10-05 DIAGNOSIS — M545 Low back pain, unspecified: Secondary | ICD-10-CM

## 2019-10-05 DIAGNOSIS — I129 Hypertensive chronic kidney disease with stage 1 through stage 4 chronic kidney disease, or unspecified chronic kidney disease: Secondary | ICD-10-CM | POA: Diagnosis not present

## 2019-10-05 DIAGNOSIS — N189 Chronic kidney disease, unspecified: Secondary | ICD-10-CM | POA: Diagnosis not present

## 2019-10-05 DIAGNOSIS — Z79899 Other long term (current) drug therapy: Secondary | ICD-10-CM | POA: Diagnosis not present

## 2019-10-05 DIAGNOSIS — Z7982 Long term (current) use of aspirin: Secondary | ICD-10-CM | POA: Diagnosis not present

## 2019-10-05 DIAGNOSIS — Z7984 Long term (current) use of oral hypoglycemic drugs: Secondary | ICD-10-CM | POA: Insufficient documentation

## 2019-10-05 DIAGNOSIS — I10 Essential (primary) hypertension: Secondary | ICD-10-CM | POA: Diagnosis not present

## 2019-10-05 LAB — CBC WITH DIFFERENTIAL/PLATELET
Abs Immature Granulocytes: 0.04 10*3/uL (ref 0.00–0.07)
Basophils Absolute: 0 10*3/uL (ref 0.0–0.1)
Basophils Relative: 0 %
Eosinophils Absolute: 0.3 10*3/uL (ref 0.0–0.5)
Eosinophils Relative: 4 %
HCT: 31.7 % — ABNORMAL LOW (ref 36.0–46.0)
Hemoglobin: 10.3 g/dL — ABNORMAL LOW (ref 12.0–15.0)
Immature Granulocytes: 1 %
Lymphocytes Relative: 32 %
Lymphs Abs: 2.4 10*3/uL (ref 0.7–4.0)
MCH: 31.1 pg (ref 26.0–34.0)
MCHC: 32.5 g/dL (ref 30.0–36.0)
MCV: 95.8 fL (ref 80.0–100.0)
Monocytes Absolute: 0.7 10*3/uL (ref 0.1–1.0)
Monocytes Relative: 9 %
Neutro Abs: 4.1 10*3/uL (ref 1.7–7.7)
Neutrophils Relative %: 54 %
Platelets: 407 10*3/uL — ABNORMAL HIGH (ref 150–400)
RBC: 3.31 MIL/uL — ABNORMAL LOW (ref 3.87–5.11)
RDW: 13.4 % (ref 11.5–15.5)
WBC: 7.5 10*3/uL (ref 4.0–10.5)
nRBC: 0 % (ref 0.0–0.2)

## 2019-10-05 LAB — BASIC METABOLIC PANEL
Anion gap: 12 (ref 5–15)
BUN: 15 mg/dL (ref 8–23)
CO2: 24 mmol/L (ref 22–32)
Calcium: 10.1 mg/dL (ref 8.9–10.3)
Chloride: 106 mmol/L (ref 98–111)
Creatinine, Ser: 1.03 mg/dL — ABNORMAL HIGH (ref 0.44–1.00)
GFR calc Af Amer: >60 mL/min (ref >60)
GFR calc non Af Amer: 56 mL/min — ABNORMAL LOW (ref >60)
Glucose, Bld: 131 mg/dL — ABNORMAL HIGH (ref 70–99)
Potassium: 3.4 mmol/L — ABNORMAL LOW (ref 3.5–5.1)
Sodium: 142 mmol/L (ref 135–145)

## 2019-10-05 LAB — METHYLMALONIC ACID, SERUM: Methylmalonic Acid: 192 nmol/L (ref 0–378)

## 2019-10-05 LAB — URINALYSIS, ROUTINE W REFLEX MICROSCOPIC
Bilirubin Urine: NEGATIVE
Glucose, UA: NEGATIVE mg/dL
Hgb urine dipstick: NEGATIVE
Ketones, ur: NEGATIVE mg/dL
Leukocytes,Ua: NEGATIVE
Nitrite: NEGATIVE
Protein, ur: NEGATIVE mg/dL
Specific Gravity, Urine: 1.01 (ref 1.005–1.030)
pH: 7 (ref 5.0–8.0)

## 2019-10-05 LAB — VITAMIN B12: Vitamin B-12: 680 pg/mL (ref 232–1245)

## 2019-10-05 LAB — SEDIMENTATION RATE: Sed Rate: 9 mm/hr (ref 0–40)

## 2019-10-05 MED ORDER — TRAMADOL HCL 50 MG PO TABS: 50.0000 mg | ORAL_TABLET | Freq: Four times a day (QID) | ORAL | 0 refills | Status: DC | PRN

## 2019-10-05 MED ORDER — KETOROLAC TROMETHAMINE 15 MG/ML IJ SOLN
15.0000 mg | Freq: Once | INTRAMUSCULAR | Status: AC
  Administered 2019-10-05: 15 mg via INTRAMUSCULAR
  Filled 2019-10-05: qty 1

## 2019-10-05 NOTE — Discharge Instructions (Addendum)
You were seen today for back pain.  The cause is unknown although your work-up is reassuring.  Follow-up with your primary physician.  You will be given a short course of pain medication.

## 2019-10-05 NOTE — ED Triage Notes (Addendum)
Pt arriving via EMS for chronic back pain that has worsened today. Pt reports she had taken a shower and laid down to go to sleep but the pain woke her up. Describes pain and sharp/burning on right side.

## 2019-10-05 NOTE — ED Provider Notes (Signed)
[facility] DEPT Provider Note   CSN: [id number] Arrival date & time: 10/05/19  0202     History Chief Complaint  Patient presents with  . Back Pain    Deborah Simon is a 69 y.o. female.  HPI     This is a 69 year old female with a history of diabetes, hypertension, hyperlipidemia, right-sided back pain who presents with back pain.  Patient reports that she woke up from sleep with intense right-sided back pain.  It is nonradiating.  She denies radiation into her leg or her abdomen.  She states the pain is significant and 10 out of 10.  She did not take anything at home for the pain.  She denies to me history of pain but chart review reveals that she has history of specifically right-sided back pain and sciatica.  Patient denies any urinary symptoms, hematuria, dysuria, fevers.  She denies any weakness, numbness, tingling of the lower extremities, bowel or bladder difficulties.  She has not noted any rash.  She describes the pain as burning and sharp.  On repeat evaluation, patient's daughter-in-law is at bedside.  She reports that she has some memory issues.  She reports she has had on and off similar pain since June of last year.  She states that it is worse at night and first thing in the morning but seems to be better when she is up and walking around.  Past Medical History:  Diagnosis Date  . Diabetes mellitus   . Fatty liver 07/12/2019  . Hyperlipidemia   . Hypertension     Patient Active Problem List   Diagnosis Date Noted  . Hyporeflexia 08/11/2019  . Gait abnormality 08/11/2019  . Intention tremor 08/11/2019  . Fatty liver 07/12/2019  . Chronic RUQ pain 07/12/2019  . Type 2 diabetes mellitus with chronic kidney disease, without long-term current use of insulin ([place]) 07/05/2019  . Right sided abdominal pain 07/05/2019  . History of cholecystectomy 07/05/2019  . Hypercalcemia 07/05/2019  . Enlarged thyroid gland 07/05/2019  .  Intermittent tremor 06/30/2019  . Side pain 06/30/2019  . Memory difficulty 06/30/2019  . Cough 08/15/2014  . Unspecified constipation 10/30/2012  . Routine general medical examination at a health care facility 10/04/2012  . Asthma, mild persistent 08/12/2012  . External hemorrhoids without complication 27/12/8673  . GERD (gastroesophageal reflux disease) 11/13/2011  . Chronic cholecystitis with calculus 09/01/2011  . Diabetes mellitus without complication ([place]) 44/92/0100  . Hyperlipidemia associated with type 2 diabetes mellitus ([place]) 03/07/2009  . ALLERGIC RHINITIS 04/28/2008  . BACK PAIN, LUMBAR, CHRONIC 08/17/2007  . OBESITY 01/07/2007  . Essential hypertension 09/01/2006    Past Surgical History:  Procedure Laterality Date  . CHOLECYSTECTOMY  10/07/11   Single site  . COLONOSCOPY  03/27/2008   Dr.Patterson-normal     OB History   No obstetric history on file.     Family History  Problem Relation Age of Onset  . Stroke Mother   . Stroke Father   . Colon cancer Neg Hx   . Colon polyps Neg Hx   . Esophageal cancer Neg Hx   . Stomach cancer Neg Hx   . Rectal cancer Neg Hx     Social History   Tobacco Use  . Smoking status: Never Smoker  . Smokeless tobacco: Never Used  Substance Use Topics  . Alcohol use: No  . Drug use: No    Home Medications Prior to Admission medications   Medication Sig Start Date End Date  Taking? Authorizing Provider  amLODipine (NORVASC) 5 MG tablet Take 5 mg by mouth daily.   Yes [provider]  aspirin EC 81 MG tablet Take 81 mg by mouth daily.   Yes [provider]  atorvastatin (LIPITOR) 40 MG tablet Take 1 tablet (40 mg total) by mouth daily. 06/30/19  Yes Henson, Vickie L, NP-C  cetirizine (ZYRTEC) 10 MG tablet Take 10 mg by mouth daily.   Yes [provider]  citalopram (CELEXA) 20 MG tablet Take 1 tablet (20 mg total) by mouth daily. 06/30/19  Yes Henson, Vickie L, NP-C  docusate sodium (COLACE) 100  MG capsule Take 100 mg by mouth 2 (two) times daily as needed for mild constipation.   Yes [provider]  ferrous sulfate 325 (65 FE) MG tablet Take 325 mg by mouth daily with breakfast.   Yes [provider]  hydrochlorothiazide (HYDRODIURIL) 25 MG tablet Take 1 tablet (25 mg total) by mouth daily. 06/30/19  Yes Henson, Vickie L, NP-C  ibuprofen (ADVIL) 200 MG tablet Take 400 mg by mouth every 6 (six) hours as needed for moderate pain.    Yes [provider]  losartan (COZAAR) 50 MG tablet Take 1 tablet (50 mg total) by mouth daily. 06/30/19  Yes Henson, Vickie L, NP-C  metoprolol succinate (TOPROL-XL) 25 MG 24 hr tablet TAKE 1 TABLET BY MOUTH DAILY 08/27/16  Yes Gottschalk, Ashly M, DO  montelukast (SINGULAIR) 10 MG tablet Take 10 mg by mouth at bedtime.   Yes [provider]  ranitidine (ZANTAC) 150 MG capsule Take 150 mg by mouth 2 (two) times daily.   Yes [provider]  baclofen (LIORESAL) 10 MG tablet Take 1 tablet (10 mg total) by mouth 3 (three) times daily as needed for muscle spasms. Patient not taking: Reported on 10/05/2019 09/28/19   Rita Ohara, MD  Blood Glucose Monitoring Suppl (FREESTYLE FREEDOM LITE) W/DEVICE KIT 1 Units by Does not apply route once. Use to test blood glucose once a day 03/17/14   Willeen Niece, MD  donepezil (ARICEPT) 5 MG tablet Take 1 tablet (5 mg total) by mouth at bedtime. Patient not taking: Reported on 09/28/2019 09/28/19   Ward Givens, NP  EPINEPHrine 0.3 mg/0.3 mL IJ SOAJ injection If shortness of breath develops, inject into thigh  then call 911.  May repeat in 5 minutes if no improvement. Patient not taking: Reported on 09/28/2019 08/18/14   McDiarmid, Blane Ohara, MD  metFORMIN (GLUCOPHAGE) 500 MG tablet Take 1 tablet (500 mg total) by mouth 2 (two) times daily with a meal. Patient not taking: Reported on 08/11/2019 06/30/19   Girtha Rm, NP-C  traMADol (ULTRAM) 50 MG tablet Take 1 tablet (50 mg total) by mouth  every 6 (six) hours as needed. 10/05/19   Jefrey Raburn, Barbette Hair, MD    Allergies    Omeprazole, Ace inhibitors, Azithromycin, Lisinopril, and Penicillins  Review of Systems   Review of Systems  Constitutional: Negative for fever.  Respiratory: Negative for shortness of breath.   Cardiovascular: Negative for chest pain.  Gastrointestinal: Negative for abdominal pain, nausea and vomiting.  Genitourinary: Negative for difficulty urinating, dysuria and hematuria.  Musculoskeletal: Positive for back pain.  Neurological: Negative for weakness and numbness.  All other systems reviewed and are negative.   Physical Exam Updated Vital Signs BP (!) 180/79   Pulse 64   Temp 97.8 F (36.6 C) (Oral)   Resp 15   LMP  (LMP Unknown)   SpO2  98%   Physical Exam Vitals and nursing note reviewed.  Constitutional:      Appearance: She is well-developed. She is not ill-appearing.  HENT:     Head: Normocephalic and atraumatic.     Mouth/Throat:     Mouth: Mucous membranes are moist.  Eyes:     Pupils: Pupils are equal, round, and reactive to light.  Cardiovascular:     Rate and Rhythm: Normal rate and regular rhythm.     Heart sounds: Normal heart sounds.  Pulmonary:     Effort: Pulmonary effort is normal. No respiratory distress.     Breath sounds: No wheezing.  Abdominal:     General: Bowel sounds are normal.     Palpations: Abdomen is soft.     Tenderness: There is no guarding or rebound.  Musculoskeletal:     Cervical back: Neck supple.     Comments: Tenderness palpation right lower lumbar paraspinous muscle region without step-off or deformity of the midline, negative straight leg raise, 5 out of 5 strength bilateral lower extremities  Skin:    General: Skin is warm and dry.     Comments: No rash noted  Neurological:     Mental Status: She is alert and oriented to person, place, and time.     Comments: Normal lower extremity reflexes  Psychiatric:        Mood and Affect: Mood  normal.     ED Results / Procedures / Treatments   Labs (all labs ordered are listed, but only abnormal results are displayed) Labs Reviewed  CBC WITH DIFFERENTIAL/PLATELET - Abnormal; Notable for the following components:      Result Value   RBC 3.31 (*)    Hemoglobin 10.3 (*)    HCT 31.7 (*)    Platelets 407 (*)    All other components within normal limits  BASIC METABOLIC PANEL - Abnormal; Notable for the following components:   Potassium 3.4 (*)    Glucose, Bld 131 (*)    Creatinine, Ser 1.03 (*)    GFR calc non Af Amer 56 (*)    All other components within normal limits  URINALYSIS, ROUTINE W REFLEX MICROSCOPIC    EKG None  Radiology No results found.  Procedures Procedures (including critical care time)  Medications Ordered in ED Medications  ketorolac (TORADOL) 15 MG/ML injection 15 mg (15 mg Intramuscular Given 10/05/19 1694)    ED Course  I have reviewed the triage vital signs and the nursing notes.  Pertinent labs & imaging results that were available during my care of the patient were reviewed by me and considered in my medical decision making (see chart for details).  Clinical Course as of Oct 04 613  Wed Oct 05, 2019  0610 Urine is at the bedside table.  Patient does not want to stay for results.  She states she feels much better.  We will send urine and call if there is an abnormality.   [CH]    Clinical Course User Index [CH] Ruthy Forry, Barbette Hair, MD   MDM Rules/Calculators/A&P                       Patient presents with right sided lower back pain.  She has tenderness to palpation over the right lower lumbar region.  No sciatica on exam.  She is neurovascularly intact.  No red flags and no signs or symptoms of cauda equina.  Considerations include musculoskeletal etiology including muscle strain, spasm, nerve impingement.  Other considerations include urinary pathology.  Lab work obtained and patient was given a small dose of Toradol and Norco.  On  recheck, she states she feels significantly better.  Labs appear at the patient's baseline.  I attempted to obtain urine but the patient did not initially provide a urine sample.  See clinical course above.  She did not wish to stay for results.  I reviewed her narcotic database.  She has had 3 prescriptions for tramadol in the last year.  Will prescribe short course of pain medication and have her follow-up closely with her primary physician.  Suspect musculoskeletal etiology.  After history, exam, and medical workup I feel the patient has been appropriately medically screened and is safe for discharge home. Pertinent diagnoses were discussed with the patient. Patient was given return precautions.   Final Clinical Impression(s) / ED Diagnoses Final diagnoses:  Acute right-sided low back pain, unspecified whether sciatica present    Rx / DC Orders ED Discharge Orders         Ordered    traMADol (ULTRAM) 50 MG tablet  Every 6 hours PRN     10/05/19 0614           Merryl Hacker, MD 10/05/19 [id number]

## 2019-10-05 NOTE — Telephone Encounter (Signed)
Called pt & LVM (ok per DPR) advising recent labs were unremarkable, no concerns. Left office number for call back if needed.

## 2019-10-06 ENCOUNTER — Encounter: Payer: Self-pay | Admitting: Physician Assistant

## 2019-10-06 ENCOUNTER — Ambulatory Visit (INDEPENDENT_AMBULATORY_CARE_PROVIDER_SITE_OTHER): Payer: Medicare Other | Admitting: Physician Assistant

## 2019-10-06 ENCOUNTER — Other Ambulatory Visit (INDEPENDENT_AMBULATORY_CARE_PROVIDER_SITE_OTHER): Payer: Medicare Other

## 2019-10-06 VITALS — BP 150/98 | HR 85 | Temp 98.5°F | Ht 62.0 in | Wt 172.0 lb

## 2019-10-06 DIAGNOSIS — R1011 Right upper quadrant pain: Secondary | ICD-10-CM | POA: Diagnosis not present

## 2019-10-06 DIAGNOSIS — R109 Unspecified abdominal pain: Secondary | ICD-10-CM

## 2019-10-06 DIAGNOSIS — R079 Chest pain, unspecified: Secondary | ICD-10-CM | POA: Diagnosis not present

## 2019-10-06 LAB — HIGH SENSITIVITY CRP: CRP, High Sensitivity: 3.31 mg/L (ref 0.000–5.000)

## 2019-10-06 LAB — SEDIMENTATION RATE: Sed Rate: 35 mm/hr — ABNORMAL HIGH (ref 0–30)

## 2019-10-06 MED ORDER — MELOXICAM 7.5 MG PO TBDP: 7.5000 mg | ORAL_TABLET | ORAL | 0 refills | Status: DC

## 2019-10-06 MED ORDER — MELOXICAM 7.5 MG PO TABS: 7.5000 mg | ORAL_TABLET | ORAL | 0 refills | Status: DC

## 2019-10-06 MED ORDER — TRAMADOL HCL 50 MG PO TABS: 50.0000 mg | ORAL_TABLET | Freq: Four times a day (QID) | ORAL | 0 refills | Status: DC | PRN

## 2019-10-06 NOTE — Patient Instructions (Signed)
If you are age 69 or older, your body mass index should be between 23-30. Your Body mass index is 31.46 kg/m. If this is out of the aforementioned range listed, please consider follow up with your Primary Care Provider.  If you are age 29 or younger, your body mass index should be between 19-25. Your Body mass index is 31.46 kg/m. If this is out of the aformentioned range listed, please consider follow up with your Primary Care Provider.   Your provider has requested that you go to the basement level for lab work before leaving today. Press "B" on the elevator. The lab is located at the first door on the left as you exit the elevator.  We have sent the following medications to your pharmacy for you to pick up at your convenience: Tramadol 50 mg Meloxicam 7.5 mg  You have been scheduled for a CT scan of the abdomen and chest at [facility].  You are scheduled on 10/19/19 at 1:30 pm. You should arrive 15 minutes prior to your appointment time for registration. Please follow the written instructions below on the day of your exam:  WARNING: IF YOU ARE ALLERGIC TO IODINE/X-RAY DYE, PLEASE NOTIFY RADIOLOGY IMMEDIATELY AT [phone]! YOU WILL BE GIVEN A 13 HOUR PREMEDICATION PREP.  1) Do not eat or drink anything after 9:30 am (4 hours prior to your test) 2) You have been given 2 bottles of oral contrast to drink. The solution may taste better if refrigerated, but do NOT add ice or any other liquid to this solution. Shake well before drinking.    Drink 1 bottle of contrast @ 11:30 am (2 hours prior to your exam)  Drink 1 bottle of contrast @ 12:30 pm (1 hour prior to your exam)  You may take any medications as prescribed with a small amount of water, if necessary. If you take any of the following medications: METFORMIN, GLUCOPHAGE, GLUCOVANCE, AVANDAMET, RIOMET, FORTAMET, [place] MET, JANUMET, GLUMETZA or METAGLIP, you MAY be asked to HOLD this medication 48 hours AFTER the exam.  The  purpose of you drinking the oral contrast is to aid in the visualization of your intestinal tract. The contrast solution may cause some diarrhea. Depending on your individual set of symptoms, you may also receive an intravenous injection of x-ray contrast/dye. Plan on being at [facility] for 30 minutes or longer, depending on the type of exam you are having performed.  This test typically takes 30-45 minutes to complete.  If you have any questions regarding your exam or if you need to reschedule, you may call the CT department at [phone] between the hours of 8:00 am and 5:00 pm, Monday-Friday.  ________________________________________________________________  Thank you for choosing me and [facility].   Amy Esterwood, PA-C

## 2019-10-06 NOTE — Progress Notes (Signed)
Subjective:    Patient ID: Deborah Simon, female    DOB: 03-03-1951, 69 y.o.   MRN: [id number]  HPI Deborah Simon is a pleasant 69 year old African-American female established with Dr. Loletha Carrow.  She was last seen here in January 2020 when she underwent colonoscopy.  She was found to have internal hemorrhoids and a few scattered right and left-sided diverticuli. She comes in today with complaints of right sided abdominal and flank pain.  She apparently has been having some intermittent symptoms over the past several months but has recently been having increase in pain which has at times become unbearable.  She actually had an ER visit yesterday due to this pain. He had labs done showing WBC of 3.3, hemoglobin 10.3 hematocrit of 31.7 potassium 3.4, no LFTs were done, UA was negative.  She did not have any imaging as she had improvement in symptoms with pain medication and Toradol. She has undergone outpatient upper abdominal ultrasound in January 2021 which showed diffused increased echogenicity of the liver, she is status post cholecystectomy. Should not says the pain is severe at times, she is uncomfortable lying on her right side.  Her son says that she wakes up in the middle of the night sometimes with complaints of severe pain.  She describes it as a burning aching type pain and says sometimes it feels as if something is moving in that area, like pins-and-needles.  She does not have any other positional symptoms, no association with p.o. intake.  Her appetite has been fine, weight has been stable no nausea or vomiting, no fever.  Bowel habits have been normal no melena or hematochezia, denies any dysuria. She does not have any history of back issues or disc disease.  The tramadol apparently has been helping with the pain but does not completely alleviate it, she was only given a few tablets from the ER and has been afraid to use it in the event that she would run out if she had more severe pain. Other  medical issues include hypertension, asthma, GERD, fatty liver, insulin-dependent diabetes hyperlipidemia and memory loss  Review of Systems Pertinent positive and negative review of systems were noted in the above HPI section.  All other review of systems was otherwise negative.  Outpatient Encounter Medications as of 10/06/2019  Medication Sig  . amLODipine (NORVASC) 5 MG tablet Take 5 mg by mouth daily.  Marland Kitchen aspirin EC 81 MG tablet Take 81 mg by mouth daily.  Marland Kitchen atorvastatin (LIPITOR) 40 MG tablet Take 1 tablet (40 mg total) by mouth daily.  . Blood Glucose Monitoring Suppl (FREESTYLE FREEDOM LITE) W/DEVICE KIT 1 Units by Does not apply route once. Use to test blood glucose once a day  . cetirizine (ZYRTEC) 10 MG tablet Take 10 mg by mouth daily.  . citalopram (CELEXA) 20 MG tablet Take 1 tablet (20 mg total) by mouth daily.  Marland Kitchen docusate sodium (COLACE) 100 MG capsule Take 100 mg by mouth 2 (two) times daily as needed for mild constipation.  Marland Kitchen donepezil (ARICEPT) 5 MG tablet Take 1 tablet (5 mg total) by mouth at bedtime.  . ferrous sulfate 325 (65 FE) MG tablet Take 325 mg by mouth daily with breakfast.  . hydrochlorothiazide (HYDRODIURIL) 25 MG tablet Take 1 tablet (25 mg total) by mouth daily.  Marland Kitchen ibuprofen (ADVIL) 200 MG tablet Take 400 mg by mouth every 6 (six) hours as needed for moderate pain.   Marland Kitchen losartan (COZAAR) 50 MG tablet Take 1 tablet (50 mg  total) by mouth daily.  . metFORMIN (GLUCOPHAGE) 500 MG tablet Take 1 tablet (500 mg total) by mouth 2 (two) times daily with a meal.  . metoprolol succinate (TOPROL-XL) 25 MG 24 hr tablet TAKE 1 TABLET BY MOUTH DAILY  . montelukast (SINGULAIR) 10 MG tablet Take 10 mg by mouth at bedtime.  . traMADol (ULTRAM) 50 MG tablet Take 1 tablet (50 mg total) by mouth every 6 (six) hours as needed.  . [DISCONTINUED] traMADol (ULTRAM) 50 MG tablet Take 1 tablet (50 mg total) by mouth every 6 (six) hours as needed.  . meloxicam (MOBIC) 7.5 MG tablet Take 1  tablet (7.5 mg total) by mouth every morning. Take in the morning with food.  . [DISCONTINUED] baclofen (LIORESAL) 10 MG tablet Take 1 tablet (10 mg total) by mouth 3 (three) times daily as needed for muscle spasms. (Patient not taking: Reported on 10/05/2019)  . [DISCONTINUED] EPINEPHrine 0.3 mg/0.3 mL IJ SOAJ injection If shortness of breath develops, inject into thigh  then call 911.  May repeat in 5 minutes if no improvement. (Patient not taking: Reported on 09/28/2019)  . [DISCONTINUED] Meloxicam 7.5 MG TBDP Take 7.5 mg by mouth every morning. Take in the morning with food  . [DISCONTINUED] ranitidine (ZANTAC) 150 MG capsule Take 150 mg by mouth 2 (two) times daily.   Facility-Administered Encounter Medications as of 10/06/2019  Medication  . 0.9 %  sodium chloride infusion   Allergies  Allergen Reactions  . Omeprazole Other (See Comments)    headache  . Ace Inhibitors Swelling    Angioedema   . Azithromycin Swelling  . Lisinopril Swelling  . Penicillins Other (See Comments)    Note  Sure rash of some sort probably, nothing airway related   Patient Active Problem List   Diagnosis Date Noted  . Hyporeflexia 08/11/2019  . Gait abnormality 08/11/2019  . Intention tremor 08/11/2019  . Fatty liver 07/12/2019  . Chronic RUQ pain 07/12/2019  . Type 2 diabetes mellitus with chronic kidney disease, without long-term current use of insulin ([place]) 07/05/2019  . Right sided abdominal pain 07/05/2019  . History of cholecystectomy 07/05/2019  . Hypercalcemia 07/05/2019  . Enlarged thyroid gland 07/05/2019  . Intermittent tremor 06/30/2019  . Side pain 06/30/2019  . Memory difficulty 06/30/2019  . Cough 08/15/2014  . Unspecified constipation 10/30/2012  . Routine general medical examination at a health care facility 10/04/2012  . Asthma, mild persistent 08/12/2012  . External hemorrhoids without complication 78/67/6720  . GERD (gastroesophageal reflux disease) 11/13/2011  . Chronic  cholecystitis with calculus 09/01/2011  . Diabetes mellitus without complication ([place]) 94/70/9628  . Hyperlipidemia associated with type 2 diabetes mellitus ([place]) 03/07/2009  . ALLERGIC RHINITIS 04/28/2008  . BACK PAIN, LUMBAR, CHRONIC 08/17/2007  . OBESITY 01/07/2007  . Essential hypertension 09/01/2006   Social History   Socioeconomic History  . Marital status: Single    Spouse name: Not on file  . Number of children: 3  . Years of education: Not on file  . Highest education level: Not on file  Occupational History  . Not on file  Tobacco Use  . Smoking status: Never Smoker  . Smokeless tobacco: Never Used  Substance and Sexual Activity  . Alcohol use: No  . Drug use: No  . Sexual activity: Not Currently  Other Topics Concern  . Not on file  Social History Narrative  . Not on file   Social Determinants of Health   Financial Resource Strain:   .  Difficulty of Paying Living Expenses:   Food Insecurity:   . Worried About Charity fundraiser in the Last Year:   . Arboriculturist in the Last Year:   Transportation Needs:   . Film/video editor (Medical):   Marland Kitchen Lack of Transportation (Non-Medical):   Physical Activity:   . Days of Exercise per Week:   . Minutes of Exercise per Session:   Stress:   . Feeling of Stress :   Social Connections:   . Frequency of Communication with Friends and Family:   . Frequency of Social Gatherings with Friends and Family:   . Attends Religious Services:   . Active Member of Clubs or Organizations:   . Attends Archivist Meetings:   Marland Kitchen Marital Status:   Intimate Partner Violence:   . Fear of Current or Ex-Partner:   . Emotionally Abused:   Marland Kitchen Physically Abused:   . Sexually Abused:     Ms. Castiglia family history includes Aneurysm in her son; Stroke in her father and mother.      Objective:    Vitals:   10/06/19 1435  BP: (!) 150/98  Pulse: 85  Temp: 98.5 F (36.9 C)    Physical Exam Well-developed  well-nourished older African-American female in no acute distress.  Accompanied by her son  height, Weight, 172 BMI 31.4  HEENT; nontraumatic normocephalic, EOMI, PER R LA, sclera anicteric. Oropharynx; not examined Neck; supple, no JVD Cardiovascular; regular rate and rhythm with S1-S2, no murmur rub or gallop Pulmonary; Clear bilaterally Abdomen; soft, she has tenderness in the right upper quadrant, into the right flank, right lower ribs, right lateral lower ribs and is also tender with palpation of the chest wall on the right, nondistended, no palpable mass or hepatosplenomegaly, bowel sounds are active Rectal; not done today Skin; benign exam, no jaundice rash or appreciable lesions Extremities; no clubbing cyanosis or edema skin warm and dry Neuro/Psych; alert and oriented x4, grossly nonfocal mood and affect appropriate       Assessment & Plan:   #57 69 year old African-American female with several month history of intermittent right sided abdominal pain which has involved the right upper quadrant, right flank right lower posterior rib area right anterior rib area.  Patient has had more severe pain recently, at times waking her from sleep and severe enough to present to the emergency room yesterday. Etiology of pain is not clear this may be of musculoskeletal etiology but definitely has tenderness in the right upper abdomen as well.  We will rule out acute intra-abdominal inflammatory process, rule out neoplasm. Consider costochondritis, consider radicular pain  #2 colon cancer screening-up-to-date with colonoscopy last January 2020 - except for diverticulosis #3 adult onset diabetes mellitus-insulin-dependent #4 fatty liver #5 memory loss #6 status post cholecystectomy #7 asthma 8.  Hypertension  Plan; sed rate, CRP, ANA Prescription sent for tramadol 50 mg 1 p.o. every 6 hours as needed for pain #50 no refills Meloxicam 7.5 mg 1 p.o. every morning to be taken with food #30 no  refills Schedule for CT scan of the abdomen and chest. Further recommendations pending results of above.  Deborah Simon S Kharis Lapenna PA-C 10/06/2019   Cc: Girtha Rm, NP-C

## 2019-10-07 LAB — ANA: Anti Nuclear Antibody (ANA): NEGATIVE

## 2019-10-11 NOTE — Progress Notes (Signed)
____________________________________________________________  Attending physician addendum:  Thank you for sending this case to me. I have reviewed the entire note, and the outlined plan seems appropriate.  Agree with CT scan to rule out other causes of intra-abdominal pathology. CRP normal, ESR normal for age. Judicious use of tramadol is warranted at this age. Plan follow-up coordination of care with primary provider as well.  Wilfrid Lund, MD  ____________________________________________________________

## 2019-10-19 ENCOUNTER — Other Ambulatory Visit: Payer: Self-pay

## 2019-10-19 ENCOUNTER — Ambulatory Visit (HOSPITAL_COMMUNITY)
Admission: RE | Admit: 2019-10-19 | Discharge: 2019-10-19 | Disposition: A | Discharge status: Home / Self Care | Payer: Medicare Other | Source: Ambulatory Visit | Attending: Physician Assistant | Admitting: Physician Assistant

## 2019-10-19 ENCOUNTER — Encounter (HOSPITAL_COMMUNITY): Payer: Self-pay

## 2019-10-19 DIAGNOSIS — R109 Unspecified abdominal pain: Secondary | ICD-10-CM | POA: Insufficient documentation

## 2019-10-19 DIAGNOSIS — R1011 Right upper quadrant pain: Secondary | ICD-10-CM | POA: Insufficient documentation

## 2019-10-19 DIAGNOSIS — R079 Chest pain, unspecified: Secondary | ICD-10-CM | POA: Diagnosis not present

## 2019-10-19 MED ORDER — SODIUM CHLORIDE (PF) 0.9 % IJ SOLN
INTRAMUSCULAR | Status: AC
  Filled 2019-10-19: qty 50

## 2019-10-19 MED ORDER — IOHEXOL 300 MG/ML  SOLN
100.0000 mL | Freq: Once | INTRAMUSCULAR | Status: AC | PRN
  Administered 2019-10-19: 100 mL via INTRAVENOUS

## 2019-10-29 ENCOUNTER — Other Ambulatory Visit: Payer: Self-pay | Admitting: Physician Assistant

## 2019-10-31 ENCOUNTER — Other Ambulatory Visit: Payer: Self-pay

## 2019-10-31 ENCOUNTER — Ambulatory Visit
Admission: RE | Admit: 2019-10-31 | Discharge: 2019-10-31 | Disposition: A | Discharge status: Home / Self Care | Payer: Medicare Other | Source: Ambulatory Visit | Attending: Family Medicine | Admitting: Family Medicine

## 2019-10-31 ENCOUNTER — Encounter: Payer: Self-pay | Admitting: Family Medicine

## 2019-10-31 ENCOUNTER — Ambulatory Visit (INDEPENDENT_AMBULATORY_CARE_PROVIDER_SITE_OTHER): Payer: Medicare Other | Admitting: Family Medicine

## 2019-10-31 VITALS — BP 140/80 | HR 82 | Temp 98.0°F | Wt 167.2 lb

## 2019-10-31 DIAGNOSIS — M545 Low back pain, unspecified: Secondary | ICD-10-CM

## 2019-10-31 DIAGNOSIS — G8929 Other chronic pain: Secondary | ICD-10-CM | POA: Diagnosis not present

## 2019-10-31 NOTE — Patient Instructions (Addendum)
Go to [facility] for your X ray.   Stop Tramadol.    Try Tylenol 1,000 mg twice daily. You may continue taking meloxicam for now.   Use ice on your back 2-3 times per day.   Try a lidocaine cream, ointment, gel or patch.   I am referring you to physical therapy.

## 2019-10-31 NOTE — Progress Notes (Signed)
Subjective:    Patient ID: Deborah Simon, female    DOB: [birth year], 69 y.o.   MRN: [id number]  HPI Chief Complaint  Patient presents with  . right side pain    right side pain- x year and month. waking up with pain everyday. been to urology, GI- been told no issues. pain from stomach to back. feels like something is moving around    She is here with chronic right low back pain which is occasionally more in her right lower abdomen. Today she complains of pain in her right low back only.  States her pain is worse with sitting and laying down. Pain improves when she is up walking around.   States she has been taking meloxicam and Tramadol and these medications have not been helping her pain.   No loss of control of bowels or bladder. No numbness, tingling or weakness.   Denies fever, chills, night sweats, dizziness, chest pain, palpitations, shortness of breath, N/V/D, or urinary symptoms.   She saw GI on 04/22/2021and had negative CT abdomen and chest on 10/19/2019.   Reviewed allergies, medications, past medical, surgical, family, and social history.    Review of Systems Pertinent positives and negatives in the history of present illness.     Objective:   Physical Exam Constitutional:      Appearance: Normal appearance.  Eyes:     Conjunctiva/sclera: Conjunctivae normal.  Pulmonary:     Effort: Pulmonary effort is normal.  Musculoskeletal:     Cervical back: Normal, normal range of motion and neck supple.     Thoracic back: Normal.     Lumbar back: Tenderness present. No bony tenderness. Negative right straight leg raise test and negative left straight leg raise test.     Comments: Normal sensation and motion of her back. Right lumbar paraspinal muscles TTP.  Pain with flexion, extension, right lateral bending and right rotation.   Skin:    Capillary Refill: Capillary refill takes less than 2 seconds.  Neurological:     General: No focal deficit present.     Mental  Status: She is alert and oriented to person, place, and time.     Cranial Nerves: No cranial nerve deficit.     Sensory: Sensation is intact.     Motor: No weakness.     Coordination: Coordination normal.     Gait: Gait normal.     Deep Tendon Reflexes: Reflexes normal.  Psychiatric:        Mood and Affect: Mood normal.        Thought Content: Thought content normal.    BP 140/80   Pulse 82   Temp 98 F (36.7 C)   Wt 167 lb 3.2 oz (75.8 kg)   LMP  (LMP Unknown)   BMI 30.58 kg/m        Assessment & Plan:  Chronic right-sided low back pain without sciatica - Plan: DG Lumbar Spine Complete, Ambulatory referral to Physical Therapy  She is here with chronic right low back pain that occasionally radiates around to her right lower abdomen.  She has had an ultrasound and more recently a CT of her abdomen to look for underlying etiologies for her pain without any findings.  She saw GI on 10/19/2019.  Her back pain more recently has been in her right low back as opposed to her abdominal region.  Suspect this is a musculoskeletal issue.  Tramadol was prescribed by GI and is no longer working for her so I  recommend that she stop it.  We will try Tylenol 1000 mg twice daily and meloxicam.  Also recommend using ice 2-3 times per day on her low back as well as a topical pain medication such as lidocaine patches.  I will send her for a lumbar x-ray and refer to physical therapy.  Her son who is a Systems analyst is with her today which is helpful with the information I am providing.  If she is worsening or not improving, my next recommendation will be referral to Ortho

## 2019-11-01 ENCOUNTER — Other Ambulatory Visit: Payer: Self-pay | Admitting: Internal Medicine

## 2019-11-01 ENCOUNTER — Encounter: Payer: Self-pay | Admitting: Family Medicine

## 2019-11-01 DIAGNOSIS — M47816 Spondylosis without myelopathy or radiculopathy, lumbar region: Secondary | ICD-10-CM | POA: Insufficient documentation

## 2019-11-01 DIAGNOSIS — G8929 Other chronic pain: Secondary | ICD-10-CM

## 2019-11-01 DIAGNOSIS — M4726 Other spondylosis with radiculopathy, lumbar region: Secondary | ICD-10-CM

## 2019-11-01 HISTORY — DX: Spondylosis without myelopathy or radiculopathy, lumbar region: M47.816

## 2019-11-01 NOTE — Progress Notes (Signed)
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 I sent her a message about the referral. Thanks.

## 2019-11-07 ENCOUNTER — Ambulatory Visit: Payer: Medicare Other | Admitting: Family Medicine

## 2019-11-11 DIAGNOSIS — E113393 Type 2 diabetes mellitus with moderate nonproliferative diabetic retinopathy without macular edema, bilateral: Secondary | ICD-10-CM | POA: Diagnosis not present

## 2019-11-11 DIAGNOSIS — H25813 Combined forms of age-related cataract, bilateral: Secondary | ICD-10-CM | POA: Diagnosis not present

## 2019-12-05 ENCOUNTER — Telehealth: Payer: Self-pay | Admitting: Family Medicine

## 2019-12-05 NOTE — Telephone Encounter (Signed)
Requested records received from [facility]

## 2019-12-20 ENCOUNTER — Other Ambulatory Visit: Payer: Self-pay | Admitting: Family Medicine

## 2019-12-20 DIAGNOSIS — I10 Essential (primary) hypertension: Secondary | ICD-10-CM

## 2019-12-20 NOTE — Telephone Encounter (Signed)
Left message for pt to call back to schedule appointment.

## 2019-12-25 ENCOUNTER — Other Ambulatory Visit: Payer: Self-pay | Admitting: Family Medicine

## 2019-12-26 NOTE — Telephone Encounter (Signed)
Ok to refill 

## 2019-12-26 NOTE — Telephone Encounter (Signed)
Please as why does she take this medication and how often? Who prescribed it initially?

## 2019-12-26 NOTE — Telephone Encounter (Signed)
Pt was on this when she first came to [place] in 06/2019 and we refilled this but then took her off this and put her on mobic. Mobic was not helping her side and back pain so she stopped taking mobic and put her self back on etodolac as that helps. Pt has not seen ortho yet as she had an appt had a death in family. Pt was confused so pt got daughter in law sharon on the phone to help understand

## 2019-12-26 NOTE — Telephone Encounter (Signed)
Is this okay to refill? 

## 2020-01-18 ENCOUNTER — Ambulatory Visit: Payer: Medicare Other | Admitting: Family Medicine

## 2020-01-25 ENCOUNTER — Ambulatory Visit (INDEPENDENT_AMBULATORY_CARE_PROVIDER_SITE_OTHER): Payer: Medicare Other | Admitting: Family Medicine

## 2020-01-25 ENCOUNTER — Encounter: Payer: Self-pay | Admitting: Family Medicine

## 2020-01-25 ENCOUNTER — Other Ambulatory Visit: Payer: Self-pay

## 2020-01-25 VITALS — BP 130/80 | HR 72 | Temp 98.2°F | Resp 16 | Wt 170.8 lb

## 2020-01-25 DIAGNOSIS — R519 Headache, unspecified: Secondary | ICD-10-CM

## 2020-01-25 DIAGNOSIS — J019 Acute sinusitis, unspecified: Secondary | ICD-10-CM | POA: Diagnosis not present

## 2020-01-25 DIAGNOSIS — E11319 Type 2 diabetes mellitus with unspecified diabetic retinopathy without macular edema: Secondary | ICD-10-CM

## 2020-01-25 DIAGNOSIS — E1122 Type 2 diabetes mellitus with diabetic chronic kidney disease: Secondary | ICD-10-CM

## 2020-01-25 DIAGNOSIS — E1169 Type 2 diabetes mellitus with other specified complication: Secondary | ICD-10-CM

## 2020-01-25 DIAGNOSIS — I10 Essential (primary) hypertension: Secondary | ICD-10-CM

## 2020-01-25 DIAGNOSIS — R109 Unspecified abdominal pain: Secondary | ICD-10-CM | POA: Diagnosis not present

## 2020-01-25 DIAGNOSIS — R413 Other amnesia: Secondary | ICD-10-CM | POA: Diagnosis not present

## 2020-01-25 DIAGNOSIS — E785 Hyperlipidemia, unspecified: Secondary | ICD-10-CM

## 2020-01-25 MED ORDER — CITALOPRAM HYDROBROMIDE 20 MG PO TABS: 20.0000 mg | ORAL_TABLET | Freq: Every day | ORAL | 1 refills | Status: DC

## 2020-01-25 MED ORDER — SULFAMETHOXAZOLE-TRIMETHOPRIM 800-160 MG PO TABS: 1.0000 | ORAL_TABLET | Freq: Two times a day (BID) | ORAL | 0 refills | Status: DC

## 2020-01-25 MED ORDER — HYDROCHLOROTHIAZIDE 25 MG PO TABS: 25.0000 mg | ORAL_TABLET | Freq: Every day | ORAL | 1 refills | Status: DC

## 2020-01-25 NOTE — Progress Notes (Signed)
Subjective:    Patient ID: Deborah Simon, female    DOB: [birth year], 69 y.o.   MRN: [id number]  HPI Chief Complaint  Patient presents with   follow-up    follow-up on right side pain. sinuses been acting up for the last month- sinus headaches across eyes   Here for a follow up on right side pain and medications. Her son is with her.  Pain in side has improved somewhat but still flares up. She did not go to PT or see Dr. Prince Rome as recommended. She will reschedule. Side pain has been worked up and thought to be MSK.   States she has been off of citalopram since June. Has noticed decreased energy and desire to get up and do things. Requests to start back on this.   Complains of a 4 week history of frontal headache, sinus pressure, and nasal congestion. States she has post nasal drainage and occasionally blows out thick mucus.  States she has been taking Tylenol.   Concerns regarding worsening memory. She is under the care of neurology for this. Has appt in October but her son would like to have her seen sooner. Taking Aricept.   HTN- states she ran out of HCTZ and needs this refilled. BP at home has been in normal range per son. Also taking losartan, metoprolol and amlodipine.   She is taking a daily statin without any issues.   Denies fever, chills, ear pain, rhinorrhea, sore throat, cough, chest pain, palpitations, shortness of breath, abdominal pain, N/V/D.   Reviewed allergies, medications, past medical, surgical, family, and social history.    Review of Systems Pertinent positives and negatives in the history of present illness.     Objective:   Physical Exam Constitutional:      General: She is not in acute distress.    Appearance: She is not ill-appearing.  HENT:     Nose:     Right Sinus: No maxillary sinus tenderness or frontal sinus tenderness.     Left Sinus: No maxillary sinus tenderness or frontal sinus tenderness.  Eyes:     Conjunctiva/sclera: Conjunctivae  normal.     Pupils: Pupils are equal, round, and reactive to light.  Cardiovascular:     Rate and Rhythm: Normal rate and regular rhythm.  Pulmonary:     Effort: Pulmonary effort is normal.     Breath sounds: Normal breath sounds.  Musculoskeletal:     Cervical back: Normal range of motion and neck supple.     Right lower leg: No edema.     Left lower leg: No edema.  Lymphadenopathy:     Cervical: No cervical adenopathy.  Skin:    General: Skin is warm and dry.  Neurological:     General: No focal deficit present.     Mental Status: She is alert. Mental status is at baseline.    BP 130/80    Pulse 72    Temp 98.2 F (36.8 C)    Resp 16    Wt 170 lb 12.8 oz (77.5 kg)    LMP  (LMP Unknown)    SpO2 98%    BMI 31.24 kg/m       Assessment & Plan:  Acute sinusitis with symptoms > 10 days -I will treat her with Bactrim for sinusitis. She may take Tylenol and Mucinex DM. Encourage hydration. Follow up if not back to baseline after completing the antibiotic.   Frontal headache -most likely related to sinus symptoms   Essential  hypertension - Plan: CBC with Differential/Platelet, Comprehensive metabolic panel, hydrochlorothiazide (HYDRODIURIL) 25 MG tablet -continue medication regimen and I will refill HCTZ   Memory difficulty -continue on Aricept and follow up with neurologist. Continue with brain games  Side pain -seems to be somewhat improved per son. She will follow with Dr Prince Rome as recommended. She has a referral to PT if she would like to try this. Negative CT abdomen and RUQ [place].   Hyperlipidemia associated with type 2 diabetes mellitus (HCC) - Plan: Lipid panel  Type 2 diabetes mellitus with chronic kidney disease, without long-term current use of insulin, unspecified CKD stage (HCC) - Plan: CBC with Differential/Platelet, Comprehensive metabolic panel, Hemoglobin A1c Check A1c and follow up. Continue metformin, healthy diet and exercise.

## 2020-01-25 NOTE — Patient Instructions (Addendum)
Please call and schedule with [facility]- Dr. Prince Rome [phone] for your chronic side pain.    Take the antibiotic for your sinus symptoms.  Stay well-hydrated.  You may also take Mucinex DM twice daily for the next 2 to 3 days.  Let me know if you are getting worse or not improving.  Start back on the citalopram but only 1/2 tablet (10 mg) for the first 7 days.  As long as you are doing well, increase to the full tablet on day 8.  I will be in touch with your lab results.

## 2020-01-26 LAB — CBC WITH DIFFERENTIAL/PLATELET
Basophils Absolute: 0 10*3/uL (ref 0.0–0.2)
Basos: 0 %
EOS (ABSOLUTE): 0.3 10*3/uL (ref 0.0–0.4)
Eos: 4 %
Hematocrit: 32.4 % — ABNORMAL LOW (ref 34.0–46.6)
Hemoglobin: 10.6 g/dL — ABNORMAL LOW (ref 11.1–15.9)
Immature Grans (Abs): 0.1 10*3/uL (ref 0.0–0.1)
Immature Granulocytes: 1 %
Lymphocytes Absolute: 2.3 10*3/uL (ref 0.7–3.1)
Lymphs: 31 %
MCH: 30.4 pg (ref 26.6–33.0)
MCHC: 32.7 g/dL (ref 31.5–35.7)
MCV: 93 fL (ref 79–97)
Monocytes Absolute: 0.7 10*3/uL (ref 0.1–0.9)
Monocytes: 10 %
Neutrophils Absolute: 4 10*3/uL (ref 1.4–7.0)
Neutrophils: 54 %
Platelets: 387 10*3/uL (ref 150–450)
RBC: 3.49 x10E6/uL — ABNORMAL LOW (ref 3.77–5.28)
RDW: 14.3 % (ref 11.7–15.4)
WBC: 7.4 10*3/uL (ref 3.4–10.8)

## 2020-01-26 LAB — COMPREHENSIVE METABOLIC PANEL
ALT: 32 IU/L (ref 0–32)
AST: 30 IU/L (ref 0–40)
Albumin/Globulin Ratio: 1.8 (ref 1.2–2.2)
Albumin: 4.6 g/dL (ref 3.8–4.8)
Alkaline Phosphatase: 109 IU/L (ref 48–121)
BUN/Creatinine Ratio: 15 (ref 12–28)
BUN: 16 mg/dL (ref 8–27)
Bilirubin Total: 0.4 mg/dL (ref 0.0–1.2)
CO2: 25 mmol/L (ref 20–29)
Calcium: 10.1 mg/dL (ref 8.7–10.3)
Chloride: 104 mmol/L (ref 96–106)
Creatinine, Ser: 1.09 mg/dL — ABNORMAL HIGH (ref 0.57–1.00)
GFR calc Af Amer: 60 mL/min/{1.73_m2} (ref >59)
GFR calc non Af Amer: 52 mL/min/{1.73_m2} — ABNORMAL LOW (ref >59)
Globulin, Total: 2.5 g/dL (ref 1.5–4.5)
Glucose: 93 mg/dL (ref 65–99)
Potassium: 4 mmol/L (ref 3.5–5.2)
Sodium: 142 mmol/L (ref 134–144)
Total Protein: 7.1 g/dL (ref 6.0–8.5)

## 2020-01-26 LAB — LIPID PANEL
Chol/HDL Ratio: 2.8 ratio (ref 0.0–4.4)
Cholesterol, Total: 146 mg/dL (ref 100–199)
HDL: 53 mg/dL (ref >39)
LDL Chol Calc (NIH): 79 mg/dL (ref 0–99)
Triglycerides: 68 mg/dL (ref 0–149)
VLDL Cholesterol Cal: 14 mg/dL (ref 5–40)

## 2020-01-26 LAB — HEMOGLOBIN A1C
Est. average glucose Bld gHb Est-mCnc: 143 mg/dL
Hgb A1c MFr Bld: 6.6 % — ABNORMAL HIGH (ref 4.8–5.6)

## 2020-01-27 ENCOUNTER — Encounter: Payer: Self-pay | Admitting: Family Medicine

## 2020-02-08 ENCOUNTER — Encounter: Payer: Self-pay | Admitting: Family Medicine

## 2020-02-09 ENCOUNTER — Ambulatory Visit: Payer: Medicare Other | Admitting: Family Medicine

## 2020-02-12 ENCOUNTER — Other Ambulatory Visit: Payer: Self-pay | Admitting: Family Medicine

## 2020-02-12 DIAGNOSIS — E1169 Type 2 diabetes mellitus with other specified complication: Secondary | ICD-10-CM

## 2020-02-12 DIAGNOSIS — I10 Essential (primary) hypertension: Secondary | ICD-10-CM

## 2020-02-28 ENCOUNTER — Encounter: Payer: Self-pay | Admitting: Family Medicine

## 2020-02-28 ENCOUNTER — Telehealth (INDEPENDENT_AMBULATORY_CARE_PROVIDER_SITE_OTHER): Payer: Medicare Other | Admitting: Family Medicine

## 2020-02-28 ENCOUNTER — Other Ambulatory Visit: Payer: Self-pay

## 2020-02-28 VITALS — BP 160/94 | HR 82 | Wt 160.0 lb

## 2020-02-28 DIAGNOSIS — R0981 Nasal congestion: Secondary | ICD-10-CM

## 2020-02-28 DIAGNOSIS — R519 Headache, unspecified: Secondary | ICD-10-CM | POA: Diagnosis not present

## 2020-02-28 NOTE — Progress Notes (Signed)
° °  Subjective:  Documentation for virtual audio and video telecommunications through Caregility encounter:  The patient was located at home. 2 patient identifiers used.  The provider was located in the office. The patient did consent to this visit and is aware of possible charges through their insurance for this visit.  The other persons participating in this telemedicine service were her son and daughter in law.   Time spent on call was 10 minutes and in review of previous records 15 minutes total.  This virtual service is not related to other E/M service within previous 7 days.   Patient ID: Deborah Simon, female    DOB: 11-02-1950, 69 y.o.   MRN: [id number]  HPI Chief Complaint  Patient presents with   sinus headache    sinus headache sinus pressure, x 1 week,    Complains of a 2 -3 week history of frontal headache. Headaches are almost daily. She also reports sinus pressure and intermittent congestion. No rhinorrhea.  Reports taking Excedrin and sudafed daily recently.   I prescribed her Bactrim for possible sinusitis but states she took this for one day and her headache worsened.   She has a history of underlying allergies. Has not been using a nasal spray.   Denies fever, chills, dizziness, chest pain, palpitations, shortness of breath, abdominal pain, N/V.   Upcoming appt with her neurologist in October.     Review of Systems Pertinent positives and negatives in the history of present illness.     Objective:   Physical Exam BP (!) 160/94    Pulse 82    Wt 160 lb (72.6 kg)    LMP  (LMP Unknown)    BMI 29.26 kg/m   Alert and in no acute distress. Respirations unlabored. Normal speech, mood and thought process.       Assessment & Plan:  Frontal headache  Nasal congestion  No obvious bacterial infection.  Stop NSAIDs including Excedrin and Sudafed. Discussed daily headaches may be related to rebound headaches from regular use of these medications.  Try a  topical nasal spray such as Nasocort and Tylenol bid.  Follow up if not improving.  See neurologist as scheduled or we may need to see if they can see her sooner if headaches are worsening.

## 2020-03-25 ENCOUNTER — Other Ambulatory Visit: Payer: Self-pay | Admitting: Adult Health

## 2020-03-25 ENCOUNTER — Other Ambulatory Visit: Payer: Self-pay | Admitting: Family Medicine

## 2020-03-25 DIAGNOSIS — E119 Type 2 diabetes mellitus without complications: Secondary | ICD-10-CM

## 2020-03-26 NOTE — Telephone Encounter (Signed)
Left message for pt to call me back 

## 2020-03-26 NOTE — Telephone Encounter (Signed)
Pt. Requesting refill on Metformin and Etodolac pt. Last apt was 02/28/20 and next apt is 04/26/20. Wasn't sure if metformin was once a day or twice daily.

## 2020-03-26 NOTE — Telephone Encounter (Signed)
Ok to refill but please find out how she is taking the Metformin

## 2020-03-27 NOTE — Telephone Encounter (Signed)
Left message for pt to call me back 

## 2020-03-28 NOTE — Telephone Encounter (Signed)
Tried to call pt multiple times

## 2020-03-28 NOTE — Telephone Encounter (Signed)
Left message for pt to call me back to find out how she is taking metformin

## 2020-04-02 NOTE — Telephone Encounter (Signed)
Pt states she takes 1 daily and is suppose to take twice a day but forgets. Advised her to start taking twice a day

## 2020-04-03 ENCOUNTER — Encounter: Payer: Self-pay | Admitting: Counselor

## 2020-04-03 ENCOUNTER — Ambulatory Visit (INDEPENDENT_AMBULATORY_CARE_PROVIDER_SITE_OTHER): Payer: Medicare Other | Admitting: Adult Health

## 2020-04-03 ENCOUNTER — Encounter: Payer: Self-pay | Admitting: Adult Health

## 2020-04-03 VITALS — BP 120/76 | HR 82 | Ht 62.0 in | Wt 168.2 lb

## 2020-04-03 DIAGNOSIS — R413 Other amnesia: Secondary | ICD-10-CM | POA: Diagnosis not present

## 2020-04-03 MED ORDER — DONEPEZIL HCL 10 MG PO TABS: ORAL_TABLET | ORAL | 5 refills | Status: DC

## 2020-04-03 NOTE — Progress Notes (Addendum)
PATIENT: Deborah Simon DOB: [birth year]  REASON FOR VISIT: follow up HISTORY FROM: patient  HISTORY OF PRESENT ILLNESS: Today 04/03/20: Ms. Colantuono is a 69 year old female with a history of memory disturbance.  She returns today with her granddaughter.  They have phoned in her daughter-in-law as well.  She is currently living with her son and daughter-in-law.  Her daughter-in-law helps her manage her medications appointments and finances.  She is able to complete all ADLs independently.  Denies any trouble sleeping.  Denies any significant changes with her mood or behavior.  Daughter-in-law does reports what she is calling panic attacks.  States that the patient will get very nervous and has tremors in her hands.  She is currently on Celexa prescribed by PCP.  Patient returns today for an evaluation.  HISTORY Ms. Haldeman is a 69 year old female with a history of intermittent tremor the returns today for follow-up.  Her main concern today is memory disturbance.  She states that she has some issues with word finding.  She lives at home with her son.  She is able to complete all ADLs independently.  She manages her own finances without difficulty.  She manages her own medications and appointments.  She does use a pillbox and references a calendar.  She is able to shop independently for her needs but the granddaughter reports that she does need a list.  No change in mood or behavior.  Patient returns today for an evaluation.  HISTORY Ms. Haggard is a 70 year old right-handed woman with an underlying medical history of hypertension, hyperlipidemia, type 2 diabetes, and overweight state, who reports an intermittent right hand tremor for the past 4 weeks or so. Symptoms came on fairly suddenly. They are primarily related to using her right hand and arm. 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 She has quite a bit of stress at work and is  contemplating retiring. She does better at home. She gets tearful at times because of stress and trembling and it helps to calm her down by talking to her. Her son has been helpful in that regard and has been able to calm her down. Rubbing her hands on her thighs helps calm her down. 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 She does not drink caffeine on a day-to-day basis, she does not drink alcohol and is a non-smoker. She tries to hydrate well, drinks about 2 bottles of 16 ounce water per day at work and also water at home. She lives with one of her 2 sons who are grown. She has not had any treatment for anxiety or stress. I reviewed your office note from 03/11/2019, which you kindly included. She had blood work in your office on 03/11/2019 and I reviewed the results: Lipid panel showed benign findings with a total cholesterol of 139, HDL 60, triglycerides 54 and LDL 66. CMP showed glucose mildly elevated at 120, BUN 19, creatinine 1.14, otherwise normal findings, CBC with differential showed WBC of 6.5, hemoglobin 11.4 which is mildly low and hematocrit 33.7 which is also mildly low, platelets mildly elevated at 439, otherwise benign findings, A1c was mildly elevated at 6.6. She denies a family history of tremor or Parkinson's disease. She has  not fallen. She has an eye examination once a year with Dr. Gershon Crane and is due for an appointment.  REVIEW OF SYSTEMS: Out of a complete 14 system review of symptoms, the patient complains only of the following symptoms, and all other reviewed systems are negative.  See HPI  ALLERGIES: Allergies  Allergen Reactions  . Omeprazole Other (See Comments)    headache  . Ace Inhibitors Swelling    Angioedema   . Azithromycin Swelling  . Lisinopril Swelling  .  Penicillins Other (See Comments)    Note  Sure rash of some sort probably, nothing airway related    HOME MEDICATIONS: Outpatient Medications Prior to Visit  Medication Sig Dispense Refill  . amLODipine (NORVASC) 5 MG tablet Take 5 mg by mouth daily.    Marland Kitchen aspirin EC 81 MG tablet Take 81 mg by mouth daily.    Marland Kitchen atorvastatin (LIPITOR) 40 MG tablet TAKE 1 TABLET BY MOUTH EVERY DAY 90 tablet 1  . Blood Glucose Monitoring Suppl (FREESTYLE FREEDOM LITE) W/DEVICE KIT 1 Units by Does not apply route once. Use to test blood glucose once a day 1 each 0  . cetirizine (ZYRTEC) 10 MG tablet Take 10 mg by mouth daily.    . citalopram (CELEXA) 20 MG tablet Take 1 tablet (20 mg total) by mouth daily. 90 tablet 1  . docusate sodium (COLACE) 100 MG capsule Take 100 mg by mouth 2 (two) times daily as needed for mild constipation.    Marland Kitchen donepezil (ARICEPT) 5 MG tablet TAKE 1 TABLET BY MOUTH EVERYDAY AT BEDTIME 90 tablet 0  . etodolac (LODINE) 400 MG tablet TAKE 1 TABLET BY MOUTH TWICE A DAY 180 tablet 0  . ferrous sulfate 325 (65 FE) MG tablet Take 325 mg by mouth daily with breakfast.    . hydrochlorothiazide (HYDRODIURIL) 25 MG tablet Take 1 tablet (25 mg total) by mouth daily. 90 tablet 1  . ibuprofen (ADVIL) 200 MG tablet Take 400 mg by mouth every 6 (six) hours as needed for moderate pain.     Marland Kitchen losartan (COZAAR) 50 MG tablet TAKE 1 TABLET BY MOUTH EVERY DAY 90 tablet 1  . metFORMIN (GLUCOPHAGE) 500 MG tablet TAKE 1 TABLET (500 MG TOTAL) BY MOUTH 2 (TWO) TIMES DAILY WITH A MEAL. 180 tablet 0  . metoprolol succinate (TOPROL-XL) 25 MG 24 hr tablet TAKE 1 TABLET BY MOUTH DAILY 90 tablet 0  . montelukast (SINGULAIR) 10 MG tablet Take 10 mg by mouth at bedtime.     . sulfamethoxazole-trimethoprim (BACTRIM DS) 800-160 MG tablet Take 1 tablet by mouth 2 (two) times daily. 20 tablet 0   Facility-Administered Medications Prior to Visit  Medication Dose Route Frequency Provider Last Rate Last Admin  . 0.9 %  sodium  chloride infusion  500 mL Intravenous Once Doran Stabler, MD        PAST MEDICAL HISTORY: Past Medical History:  Diagnosis Date  . 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 Diabetes mellitus   . Fatty liver 07/12/2019  . Hyperlipidemia   . Hypertension     PAST SURGICAL HISTORY: Past Surgical History:  Procedure Laterality Date  . CHOLECYSTECTOMY  10/07/11   Single site  . COLONOSCOPY  03/27/2008   Dr.Patterson-normal    FAMILY HISTORY: Family History  Problem Relation Age of Onset  . Stroke Mother   . Stroke Father   . Aneurysm Son   . Colon cancer Neg Hx   . Colon polyps  Neg Hx   . Esophageal cancer Neg Hx   . Stomach cancer Neg Hx   . Rectal cancer Neg Hx     SOCIAL HISTORY: Social History   Socioeconomic History  . Marital status: Single    Spouse name: Not on file  . Number of children: 3  . Years of education: Not on file  . Highest education level: Not on file  Occupational History  . Not on file  Tobacco Use  . Smoking status: Never Smoker  . Smokeless tobacco: Never Used  Vaping Use  . Vaping Use: Never used  Substance and Sexual Activity  . Alcohol use: No  . Drug use: No  . Sexual activity: Not Currently  Other Topics Concern  . Not on file  Social History Narrative  . Not on file   Social Determinants of Health   Financial Resource Strain:   . Difficulty of Paying Living Expenses: Not on file  Food Insecurity:   . Worried About Charity fundraiser in the Last Year: Not on file  . Ran Out of Food in the Last Year: Not on file  Transportation Needs:   . Lack of Transportation (Medical): Not on file  . Lack of Transportation (Non-Medical): Not on file  Physical Activity:   . Days of Exercise per Week: Not on file  . Minutes of Exercise per Session: Not on file  Stress:   . Feeling of Stress : Not on file  Social Connections:   . Frequency of Communication with Friends and Family: Not on file  . Frequency of  Social Gatherings with Friends and Family: Not on file  . Attends Religious Services: Not on file  . Active Member of Clubs or Organizations: Not on file  . Attends Archivist Meetings: Not on file  . Marital Status: Not on file  Intimate Partner Violence:   . Fear of Current or Ex-Partner: Not on file  . Emotionally Abused: Not on file  . Physically Abused: Not on file  . Sexually Abused: Not on file      PHYSICAL EXAM  Vitals:   04/03/20 1050  BP: 120/76  Pulse: 82  Weight: 168 lb 3.2 oz (76.3 kg)  Height: 5' 2"  (1.575 m)   Body mass index is 30.76 kg/m.   MMSE - Mini Mental State Exam 04/03/2020 09/28/2019  Orientation to time 3 4  Orientation to Place 5 5  Registration 3 3  Attention/ Calculation 1 4  Recall 0 0  Language- name 2 objects 2 2  Language- repeat 1 1  Language- follow 3 step command 3 2  Language- read & follow direction 1 1  Write a sentence 1 1  Copy design 0 0  Copy design-comments - 5 animals  Total score 20 23     Generalized: Well developed, in no acute distress   Neurological examination  Mentation: Alert oriented to time, place, history taking. Follows all commands speech and language fluent Cranial nerve II-XII: Pupils were equal round reactive to light. Extraocular movements were full, visual field were full on confrontational test. Facial sensation and strength were normal. Uvula tongue midline. Head turning and shoulder shrug  were normal and symmetric. Motor: The motor testing reveals 5 over 5 strength of all 4 extremities. Good symmetric motor tone is noted throughout.  Sensory: Sensory testing is intact to soft touch on all 4 extremities. No evidence of extinction is noted.  Coordination: Cerebellar testing reveals good finger-nose-finger and heel-to-shin  bilaterally. Tremor noted in upper extremities right worse than left Gait and station: Gait is normal. Tandem gait is normal. Romberg is negative--tremor noted in the upper  extremities left greater than right no drift is seen.  Reflexes: Deep tendon reflexes are symmetric and normal bilaterally.   DIAGNOSTIC DATA (LABS, IMAGING, TESTING) - I reviewed patient records, labs, notes, testing and imaging myself where available.  Lab Results  Component Value Date   WBC 7.4 01/25/2020   HGB 10.6 (L) 01/25/2020   HCT 32.4 (L) 01/25/2020   MCV 93 01/25/2020   PLT 387 01/25/2020      Component Value Date/Time   NA 142 01/25/2020 1456   K 4.0 01/25/2020 1456   CL 104 01/25/2020 1456   CO2 25 01/25/2020 1456   GLUCOSE 93 01/25/2020 1456   GLUCOSE 131 (H) 10/05/2019 0315   BUN 16 01/25/2020 1456   CREATININE 1.09 (H) 01/25/2020 1456   CREATININE 1.00 (H) 11/16/2015 1008   CALCIUM 10.1 01/25/2020 1456   PROT 7.1 01/25/2020 1456   ALBUMIN 4.6 01/25/2020 1456   AST 30 01/25/2020 1456   ALT 32 01/25/2020 1456   ALKPHOS 109 01/25/2020 1456   BILITOT 0.4 01/25/2020 1456   GFRNONAA 52 (L) 01/25/2020 1456   GFRNONAA 60 11/16/2015 1008   GFRAA 60 01/25/2020 1456   GFRAA 69 11/16/2015 1008   Lab Results  Component Value Date   CHOL 146 01/25/2020   HDL 53 01/25/2020   LDLCALC 79 01/25/2020   LDLDIRECT 131 (H) 09/01/2011   TRIG 68 01/25/2020   CHOLHDL 2.8 01/25/2020   Lab Results  Component Value Date   HGBA1C 6.6 (H) 01/25/2020   Lab Results  Component Value Date   VITAMINB12 680 09/28/2019   Lab Results  Component Value Date   TSH 1.440 06/30/2019      ASSESSMENT AND PLAN 69 y.o. year old female  has a past medical history of Degenerative joint disease (DJD) of lumbar spine (11/01/2019), Diabetes mellitus, Fatty liver (07/12/2019), Hyperlipidemia, and Hypertension. here with:  1.  Memory disturbance  MMSE 20 out of 30 previously 24 out of 30  Increase Aricept to 10 mg at bedtime  At last visit referral sent for neuropsychological evaluation they called the patient twice with no call back.  I provided them with the number to this office to  get an appointment scheduled  Follow-up in 6 months or sooner if needed   I spent 30 minutes of face-to-face and non-face-to-face time with patient.  This included previsit chart review, lab review, study review, order entry, electronic health record documentation, patient education.  Ward Givens, MSN, NP-C 04/03/2020, 11:18 AM [facility] [address] [place], [place] [address] [phone]  I reviewed the above note and documentation by the Nurse Practitioner and agree with the history, exam, assessment and plan as outlined above. I was available for consultation. Star Age, MD, PhD [facility])

## 2020-04-03 NOTE — Patient Instructions (Signed)
Your Plan:  Increase Aricept 10 mg at bedtime Call for neuropsychiatry appointment [phone]  Thank you for coming to see [place] at [facility]. I hope we have been able to provide you high quality care today.  You may receive a patient satisfaction survey over the next few weeks. We would appreciate your feedback and comments so that we may continue to improve ourselves and the health of our patients.

## 2020-04-20 ENCOUNTER — Encounter: Payer: Self-pay | Admitting: Medical

## 2020-04-20 ENCOUNTER — Other Ambulatory Visit: Payer: Self-pay

## 2020-04-20 ENCOUNTER — Telehealth (INDEPENDENT_AMBULATORY_CARE_PROVIDER_SITE_OTHER): Payer: Medicare Other | Admitting: Medical

## 2020-04-20 VITALS — BP 159/78 | HR 87 | Ht 64.0 in | Wt 165.0 lb

## 2020-04-20 DIAGNOSIS — J019 Acute sinusitis, unspecified: Secondary | ICD-10-CM | POA: Diagnosis not present

## 2020-04-20 MED ORDER — DOXYCYCLINE HYCLATE 100 MG PO TABS: 100.0000 mg | ORAL_TABLET | Freq: Two times a day (BID) | ORAL | 0 refills | Status: DC

## 2020-04-20 NOTE — Progress Notes (Signed)
  Subjective:     Patient ID: Deborah Simon, female   DOB: 02/22/1951, 69 y.o.   MRN: [id number]  This visit type was conducted due to national recommendations for restrictions regarding the COVID-19 Pandemic (e.g. social distancing) in an effort to limit this patient's exposure and mitigate transmission in our community.  Due to their co-morbid illnesses, this patient is at least at moderate risk for complications without adequate follow up.  This format is felt to be most appropriate for this patient at this time.    Documentation for virtual audio and video telecommunications through [place] encounter:  The patient was located at home. The provider was located in the office. The patient did consent to this visit and is aware of possible charges through their insurance for this visit.  The other persons participating in this telemedicine service were daughter. Time spent on call was 20 minutes and in review of previous records 20 minutes total.  This virtual service is not related to other E/M service within previous 7 days.   HPI Chief Complaint  Patient presents with  . Sinus Problem    head and eye pressure x1 week    Virtual consult for possible sinus infection.   Dealing with sinus issues several weeks.   Has post nasal drainage, sinus pressure, mild sore throat.  No sore throat, no fever, no nausea, no vomiting.   Every now and then has some cough.  No loss in smell and taste.  Has tried sudafed, cold medication.  otherwise in normal state of health.  No other aggravating or relieving factors. No other complaint.  Past Medical History:  Diagnosis Date  . 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 Diabetes mellitus   . Fatty liver 07/12/2019  . Hyperlipidemia   . Hypertension    Review of Systems As in subjective    Objective:   Physical Exam Due to coronavirus pandemic stay at home measures, patient visit was virtual and they were not examined in  person.   BP (!) 159/78   Pulse 87   Ht 5\' 4"  (1.626 m)   Wt 165 lb (74.8 kg)   LMP  (LMP Unknown)   BMI 28.32 kg/m       Assessment:     Encounter Diagnosis  Name Primary?  . Acute non-recurrent sinusitis, unspecified location Yes       Plan:     Discussed symptoms, concerns, limitations of virtual consult.  Begin medicaiton below, rest, hydrate well, discussed supportive care.  F/u if worse or not much improved in the next week  Deborah Simon was seen today for sinus problem.  Diagnoses and all orders for this visit:  Acute non-recurrent sinusitis, unspecified location  Other orders -     doxycycline (VIBRA-TABS) 100 MG tablet; Take 1 tablet (100 mg total) by mouth 2 (two) times daily.  f/u prn

## 2020-04-26 ENCOUNTER — Ambulatory Visit (INDEPENDENT_AMBULATORY_CARE_PROVIDER_SITE_OTHER): Payer: Medicare Other | Admitting: Family Medicine

## 2020-04-26 ENCOUNTER — Other Ambulatory Visit: Payer: Self-pay

## 2020-04-26 ENCOUNTER — Encounter: Payer: Self-pay | Admitting: Family Medicine

## 2020-04-26 VITALS — BP 120/80 | HR 85 | Wt 167.0 lb

## 2020-04-26 DIAGNOSIS — R519 Headache, unspecified: Secondary | ICD-10-CM | POA: Diagnosis not present

## 2020-04-26 DIAGNOSIS — J310 Chronic rhinitis: Secondary | ICD-10-CM | POA: Diagnosis not present

## 2020-04-26 NOTE — Progress Notes (Signed)
Subjective:    Patient ID: Deborah Simon, female    DOB: Oct 13, 1950, 69 y.o.   MRN: [id number]  HPI Chief Complaint  Patient presents with   mirgraines    migraine headaches everyday for the last couple weeks. seeeing neuro for memory but not headaches   She is here with complaints of bilateral frontal headaches for the past several months. States headache feels like tension or pressure. Headaches are present when she wakes up typically but can also occur midday. States she has headaches on average 5 out of 7 days/week. Denies having a headache today. Denies dizziness, vision changes, nausea, vomiting. Tylenol helps but temporarily.  She has been treated for sinusitis without any relief.  She denies any changes in her caffeine intake. States she drinks plenty of water. States she sleeps well. No changes in her diet. States she has had an eye exam recently.   She recently saw her neurologist but patient states they did not discuss her headaches. States her headaches worsened around the time her Aricept dose was increased.  Her daughter-in-law states she has an appointment with a neuropsychologist on 05/14/2020 and has 6 month f/u in May with neurologist.   Denies fever, chills,  chest pain, palpitations, shortness of breath, abdominal pain, urinary symptoms, LE edema.     Review of Systems Pertinent positives and negatives in the history of present illness.     Objective:   Physical Exam Constitutional:      General: She is not in acute distress.    Appearance: Normal appearance. She is not ill-appearing.  HENT:     Right Ear: Tympanic membrane and ear canal normal.     Left Ear: Tympanic membrane and ear canal normal.     Nose: Congestion present.     Right Turbinates: Swollen.     Right Sinus: No maxillary sinus tenderness or frontal sinus tenderness.     Left Sinus: No maxillary sinus tenderness or frontal sinus tenderness.  Eyes:     General: Lids are normal. No  visual field deficit.    Extraocular Movements: Extraocular movements intact.     Conjunctiva/sclera: Conjunctivae normal.     Pupils: Pupils are equal, round, and reactive to light.  Cardiovascular:     Rate and Rhythm: Normal rate and regular rhythm.     Pulses: Normal pulses.     Heart sounds: Normal heart sounds.  Pulmonary:     Effort: Pulmonary effort is normal.     Breath sounds: Normal breath sounds.  Musculoskeletal:     Cervical back: Normal range of motion and neck supple.  Lymphadenopathy:     Cervical: No cervical adenopathy.     Upper Body:     Right upper body: No supraclavicular adenopathy.     Left upper body: No supraclavicular adenopathy.  Skin:    General: Skin is warm and dry.     Capillary Refill: Capillary refill takes less than 2 seconds.  Neurological:     Mental Status: She is alert. Mental status is at baseline.     Cranial Nerves: Cranial nerves are intact.     Sensory: Sensation is intact.     Motor: Motor function is intact.     Coordination: Coordination is intact.     Gait: Gait is intact.  Psychiatric:        Attention and Perception: Attention normal.        Mood and Affect: Mood normal.        Speech:  Speech normal.        Behavior: Behavior normal.    BP 120/80    Pulse 85    Wt 167 lb (75.8 kg)    LMP  (LMP Unknown)    BMI 28.67 kg/m       Assessment & Plan:  Chronic daily headache  Rhinitis, unspecified type  We discussed possible etiologies for chronic daily headaches.  They do not appear to be migrainous. She has been treated for sinusitis with no improvement in headaches.  Her daughter-in-law stopped her Excedrin use and she has only been taking Tylenol.  Less likely rebound headaches.   She denies skipping meals, fluctuations in caffeine or sleep.  She reports drinking plenty of water.  She is not a smoker.  Does not drink alcohol.  Reviewed medications and considered whether or not Aricept may be contributing.  Her headaches  seem to increase in frequency after the increased dose of her Aricept in October.  She will try Flonase for edema of her right nasal passage.

## 2020-04-26 NOTE — Patient Instructions (Signed)
Try using Flonase daily for the next couple of weeks to see if this helps with your swollen nasal passages and drainage.  Check with your neurologist regarding your chronic daily headaches.  I do not have an answer for you today but we have ruled out some possibilities.

## 2020-04-27 ENCOUNTER — Encounter: Payer: Self-pay | Admitting: Adult Health

## 2020-05-14 ENCOUNTER — Encounter: Payer: Medicare Other | Admitting: Counselor

## 2020-05-14 MED ORDER — MEMANTINE HCL 5 MG PO TABS: ORAL_TABLET | ORAL | 5 refills | Status: DC

## 2020-05-22 ENCOUNTER — Encounter: Payer: Medicare Other | Admitting: Counselor

## 2020-05-24 ENCOUNTER — Other Ambulatory Visit: Payer: Self-pay | Admitting: Family Medicine

## 2020-05-24 DIAGNOSIS — I10 Essential (primary) hypertension: Secondary | ICD-10-CM

## 2020-06-11 ENCOUNTER — Other Ambulatory Visit: Payer: Self-pay | Admitting: Adult Health

## 2020-06-12 ENCOUNTER — Encounter: Payer: Self-pay | Admitting: Neurology

## 2020-06-12 ENCOUNTER — Other Ambulatory Visit: Payer: Self-pay | Admitting: Neurology

## 2020-06-12 MED ORDER — MEMANTINE HCL 10 MG PO TABS: 10.0000 mg | ORAL_TABLET | Freq: Two times a day (BID) | ORAL | 1 refills | Status: DC

## 2020-06-20 ENCOUNTER — Encounter: Payer: Self-pay | Admitting: Counselor

## 2020-06-20 ENCOUNTER — Ambulatory Visit (INDEPENDENT_AMBULATORY_CARE_PROVIDER_SITE_OTHER): Payer: Medicare HMO | Admitting: Counselor

## 2020-06-20 ENCOUNTER — Ambulatory Visit: Payer: Medicare HMO | Admitting: Psychology

## 2020-06-20 ENCOUNTER — Other Ambulatory Visit: Payer: Self-pay

## 2020-06-20 DIAGNOSIS — F0391 Unspecified dementia with behavioral disturbance: Secondary | ICD-10-CM | POA: Diagnosis not present

## 2020-06-20 DIAGNOSIS — R451 Restlessness and agitation: Secondary | ICD-10-CM | POA: Diagnosis not present

## 2020-06-20 DIAGNOSIS — G3101 Pick's disease: Secondary | ICD-10-CM

## 2020-06-20 DIAGNOSIS — F419 Anxiety disorder, unspecified: Secondary | ICD-10-CM | POA: Diagnosis not present

## 2020-06-20 DIAGNOSIS — F09 Unspecified mental disorder due to known physiological condition: Secondary | ICD-10-CM

## 2020-06-20 DIAGNOSIS — F0280 Dementia in other diseases classified elsewhere without behavioral disturbance: Secondary | ICD-10-CM

## 2020-06-20 NOTE — Progress Notes (Signed)
[facility]  Patient Name: Kaneisha Ellenberger MRN: [id number] Date of Birth: Jan 22, 1951 Age: 70 y.o. Education: 12 years  Referral Circumstances and Background Information  Ms. Kessler Kopinski is a 70 y.o., right-hand dominant, married woman with a history of HTN, HLD, DMII, and memory problems who was referred by Ward Givens, NP at [facility]. Review of Ms. Sondra Come notes shows the patient to have an intermittent tremor (L > R on exam) and otherwise normal elemental neurological exam. She had an MMSE of 20 at her last appointment (04/03/2020), down from her previous score of 23/30 on 09/28/2019.   On interview, the patient reported that she does have problems with memory and thinking, that started "a while back" (several months ago). She forgets things, "any little thing," such as where she has placed things and things that she has read. Her daughter in law Ivin Booty presented with her and stated that she has noticed gradual decline over the past two years. She felt like the patient was doing much better when she was working, she was very routine oriented and did everything a specific way. After she retired, in October/November of 2020, she started noticing that she was doing things differently. She will have conversations with people and then completely forget what they had talked about, she will ask repetitive questions. Her difficulties have been worsening over time. With respect to language, she denied any word finding problems, comprehension difficulties, single word comprehension difficulties, or slow effortful speech. With respect to behavior changes, she is quite anxious and tremulous when she gets worried about things. She will ruminate on things and obsess over them, often related to her memory problems, such as where she put something. They largely denied any inappropriate or disinhibited behaviors (e.g., anger, strange new eating habits, social disinhibition or  using rough language in public). She does have apathy, she has stopped going from church and has a low activity level now, although it doesn't sound profound. She may have some stereotyped/repetitive behaviors in the form of wringing her hands, twittling her thumbs, and she is somewhat compulsive about the dishes, she will instantly do the dishes, even if there is only one. She was never like that before. With respect to mood, she is typically very pleasant and happy, although she does get nervous. Her sleep is good, perhaps excessively, she will be in her room for 12 hours, although she is watching TV some of that time. Her appetite is good and she has no major weight changes. Her energy is good. She has no hallucinations, delusions, or dream enactment behavior that has been noticed.   With respect to functioning, the patient has slowly decreased her driving over the years. Her son and daughter in law started taking her places because they were concerned over the past. She stopped driving entirely several months ago, she wanted to run some errands and ended up getting lost. She wasn't far from home but needed to call her son and daughter to help her navigate her way back.Her daughter in law helps her with money management, over the past 8 months, she was making errors. She is still able to cook things at her usual level of skill, do dishes, and use appliances. She is able to use her phone although not at her past level of skill, she needs some help now. She does not use a computer. She was making errors with her medications, so her DIL took over managing them. The patient still remembers to  take them reliably. Her daughter in law thinks that she would be able to go to the grocery store and get a list of items but it would be tenuous. They typically accompany her to the store. She has some disorientation to time, she hardly ever knows the date of the week, they use a calendar for her and help her manage her  appointments.   Past Medical History and Review of Relevant Studies   Patient Active Problem List   Diagnosis Date Noted  . Diabetic retinopathy associated with controlled type 2 diabetes mellitus ([place]) 01/25/2020  . 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 Hyporeflexia 08/11/2019  . Gait abnormality 08/11/2019  . Intention tremor 08/11/2019  . Fatty liver 07/12/2019  . Chronic RUQ pain 07/12/2019  . Type 2 diabetes mellitus with chronic kidney disease, without long-term current use of insulin ([place]) 07/05/2019  . Right sided abdominal pain 07/05/2019  . History of cholecystectomy 07/05/2019  . Hypercalcemia 07/05/2019  . Enlarged thyroid gland 07/05/2019  . Intermittent tremor 06/30/2019  . Side pain 06/30/2019  . Memory difficulty 06/30/2019  . Cough 08/15/2014  . Unspecified constipation 10/30/2012  . Routine general medical examination at a health care facility 10/04/2012  . Asthma, mild persistent 08/12/2012  . External hemorrhoids without complication 56/25/6389  . GERD (gastroesophageal reflux disease) 11/13/2011  . Chronic cholecystitis with calculus 09/01/2011  . Diabetes mellitus without complication ([place]) 37/34/2876  . Hyperlipidemia associated with type 2 diabetes mellitus ([place]) 03/07/2009  . ALLERGIC RHINITIS 04/28/2008  . BACK PAIN, LUMBAR, CHRONIC 08/17/2007  . OBESITY 01/07/2007  . Essential hypertension 09/01/2006   Review of Neuroimaging and Relevant Medical History: The patient has an MRI from 04/21/2020, which shows some volume loss that does appear to be concentrated around the right temporal lobe, as already observed by Dr. Felecia Shelling the interpreting neurologist. There is mild volume loss elsewhere in the cortex as well and some in the orbitofrontal cortex. Apart from that there is a mild + burden of leukoaraiosis, enough to contribute but unlikely enough to cause a dementia level problem.   The patient denied any history of strokes,  seizures, significant head injuries, or neurological surgery.   Current Outpatient Medications  Medication Sig Dispense Refill  . albuterol (VENTOLIN HFA) 108 (90 Base) MCG/ACT inhaler Inhale into the lungs.    Marland Kitchen amLODipine (NORVASC) 5 MG tablet Take 5 mg by mouth daily.     Marland Kitchen aspirin EC 81 MG tablet Take 81 mg by mouth daily.    Marland Kitchen atorvastatin (LIPITOR) 40 MG tablet TAKE 1 TABLET BY MOUTH EVERY DAY 90 tablet 1  . Blood Glucose Monitoring Suppl (FREESTYLE FREEDOM LITE) W/DEVICE KIT 1 Units by Does not apply route once. Use to test blood glucose once a day 1 each 0  . cetirizine (ZYRTEC) 10 MG tablet Take 10 mg by mouth daily.    . citalopram (CELEXA) 20 MG tablet Take 1 tablet (20 mg total) by mouth daily. 90 tablet 1  . docusate sodium (COLACE) 100 MG capsule Take 100 mg by mouth 2 (two) times daily as needed for mild constipation.     Marland Kitchen donepezil (ARICEPT) 10 MG tablet TAKE 1 TABLET BY MOUTH EVERYDAY AT BEDTIME 30 tablet 5  . doxycycline (VIBRA-TABS) 100 MG tablet Take 1 tablet (100 mg total) by mouth 2 (two) times daily. 20 tablet 0  . etodolac (LODINE) 400 MG tablet TAKE 1 TABLET BY MOUTH TWICE A DAY 180 tablet 0  . ferrous  sulfate 325 (65 FE) MG tablet Take 325 mg by mouth daily with breakfast.     . hydrochlorothiazide (HYDRODIURIL) 25 MG tablet Take 1 tablet (25 mg total) by mouth daily. 90 tablet 1  . ibuprofen (ADVIL) 200 MG tablet Take 400 mg by mouth every 6 (six) hours as needed for moderate pain.     Marland Kitchen losartan (COZAAR) 50 MG tablet TAKE 1 TABLET BY MOUTH EVERY DAY 90 tablet 0  . memantine (NAMENDA) 10 MG tablet Take 1 tablet (10 mg total) by mouth 2 (two) times daily. 180 tablet 1  . memantine (NAMENDA) 5 MG tablet Take 1 tablet PO daily for 1 week then increase to 1 tablet twice a day thereafter 60 tablet 5  . metFORMIN (GLUCOPHAGE) 500 MG tablet TAKE 1 TABLET (500 MG TOTAL) BY MOUTH 2 (TWO) TIMES DAILY WITH A MEAL. 180 tablet 0  . metoprolol succinate (TOPROL-XL) 25 MG 24 hr  tablet TAKE 1 TABLET BY MOUTH DAILY 90 tablet 0  . montelukast (SINGULAIR) 10 MG tablet Take 10 mg by mouth at bedtime.      Current Facility-Administered Medications  Medication Dose Route Frequency Provider Last Rate Last Admin  . 0.9 %  sodium chloride infusion  500 mL Intravenous Once Doran Stabler, MD        Family History  Problem Relation Age of Onset  . Stroke Mother   . Stroke Father   . Aneurysm Son   . Colon cancer Neg Hx   . Colon polyps Neg Hx   . Esophageal cancer Neg Hx   . Stomach cancer Neg Hx   . Rectal cancer Neg Hx    There is no  family history of neurodegenerative dementia. Her mother did have a stroke and her father had a stroke also. Her father may have developed vascular dementia. There is no  family history of psychiatric illness.  Psychosocial History  Developmental, Educational and Employment History: The patient is a native of [place], she reported a normal childhood with no abuse or neglect. She reported that she did well in school, was never held back, and didn't have any learning difficulties. 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 She worked at [employer] most recently. She was living in [place] and then moved up here approximately 7 years ago to live with her son, after her father died. She retired from her most recent job about 1 year ago.   Psychiatric History: No previous psychiatric history, she currently is taking Celexa currently prescribed by her PCP.   Substance Use History: The patient denied any smoking, alcohol use, or drug use.   Relationship History and Living Cimcumstances: She was married twice, "years ago," and has children from her first marriage, 3 sons. She lives with her one son and her other son is in the area also.   Mental Status and Behavioral Observations  Sensorium/Arousal: The patient's level of arousal was awake and  alert. Hearing and vision were adequate for testing purposes. Orientation: The patient was oriented to person, place as "doctors office" (not aware of practice name or healthcare system), time, and situation. She reported the correct current president but stated it was [place] before that.  Appearance: Dressed in appropriate, casual clothing with reasonable grooming and hygiene.  Behavior: Pleasant, appropriate.  Speech/language: Speech was normal in rate, rhythm, and volume. She spoke with an accent and  colloquial pronunciation at times.  Gait/Posture: Gait was normal on exam with Ward Givens.  Movement: Patient had some postural/intention tremor but it was not prominent.  Social Comportment: Pleasant, appropriate Mood: "Good!" Affect: Some mild anxiety, otherwise euthymic Thought process/content: Logical and goal oriented. Thought content was appropriate to the topics discussed. She did tend to defer to her daughter in law.  Safety: No safety concerns identified in this euthymic patient.  Insight: Fair, patient has awareness of deficits and is receptive to family's attempts to help.   MMSE - Mini Mental State Exam 04/03/2020 09/28/2019  Orientation to time 3 4  Orientation to Place 5 5  Registration 3 3  Attention/ Calculation 1 4  Recall 0 0  Language- name 2 objects 2 2  Language- repeat 1 1  Language- follow 3 step command 3 2  Language- read & follow direction 1 1  Write a sentence 1 1  Copy design 0 0  Copy design-comments - 5 animals  Total score 20 23   Test Procedures  Wide Range Achievement Test - 4   Word Reading Reynolds Intellectual Screening Test Wechsler Adult Intelligence Scale - IV  Digit Span  Arithmetic  Symbol Search  Coding Repeatable Battery for the Assessment of Neuropsychological Status (Form A) "A" Random Letter Test The Dot Counting Test Controlled Oral Word Association (F-A-S) Semantic Fluency (Animals) Trail Making Test A & B Modified  Wisconsin Card Sorting Test Geriatric Depression Scale - Short Form GAD-7  Plan  Laury Huizar was seen for a psychiatric diagnostic evaluation and neuropsychological testing. She is a pleasant, 70 year old woman with a history of progressive decline over about the past two years, noticed by her family. At this point they have stepped in to provide help with medications, she is no longer driving as of several months ago, and she is quite forgetful. She has neuroimaging with a bit of atrophy in the right temporal lobe. On testing I completed with her, she has very low object confrontation naming and borderline comprehension, results of additional testing pending. Full and complete note with impressions, recommendations, and interpretation of test data to follow.   Viviano Simas Nicole Kindred, PsyD, [place] Clinical Neuropsychologist  Informed Consent and Coding/Compliance  Risks and benefits of the evaluation were discussed with the patient prior to all testing procedures. I conducted a clinical interview and neuropsychological testing (at least two tests) with Epimenio Sarin and Milana Kidney, B.S. (Technician) administered additional test procedures. The patient was able to tolerate the testing procedures and the patient (and/or family if applicable) is likely to benefit from further follow up to receive the diagnosis and treatment recommendations, which will be rendered at the next encounter. Billing below reflects technician time, my direct face-to-face time with the patient, time spent in test administration, and time spent in professional activities including but not limited to: neuropsychological test interpretation, integration of neuropsychological test data with clinical history, report preparation, treatment planning, care coordination, and review of diagnostically pertinent medical history or studies.   Services associated with this encounter: Clinical Interview [phone]) plus 60 minutes (44695;  Neuropsychological Evaluation by Professional)  1150 minutes (07225; Neuropsychological Evaluation by Professional, Adl.) 19 minutes (75051; Test Administration by Professional) 30 minutes (83358; Neuropsychological Testing by Technician) 75 minutes (25189; Neuropsychological Testing by Technician, Adl.)

## 2020-06-20 NOTE — Progress Notes (Signed)
   Psychometrist Note   Cognitive testing was administered to Deborah Simon by Milana Kidney, B.S. (Technician) under the supervision of Alphonzo Severance, Psy.D., ABN. Ms. Belvin was able to tolerate all test procedures. Dr. Nicole Kindred met with the patient as needed to manage any emotional reactions to the testing procedures. Rest breaks were offered.    The battery of tests administered was selected by Dr. Nicole Kindred with consideration to the patient's current level of functioning, the nature of her symptoms, emotional and behavioral responses during the interview, level of literacy, observed level of motivation/effort, and the nature of the referral question. This battery was communicated to the psychometrist. Communication between Dr. Nicole Kindred and the psychometrist was ongoing throughout the evaluation and Dr. Nicole Kindred was immediately accessible at all times. Dr. Nicole Kindred provided supervision to the technician on the date of this service, to the extent necessary to assure the quality of all services provided.    Ms. Sample will return in approximately one week for an interactive feedback session with Dr. Nicole Kindred, at which time test performance, clinical impressions, and treatment recommendations will be reviewed in detail. The patient understands she can contact our office should she require our assistance before this time.   A total of 105 minutes of billable time were spent with Deborah Simon by the technician, including test administration and scoring time. Billing for these services is reflected in Dr. Les Pou note.   This note reflects time spent with the psychometrician and does not include test scores, clinical history, or any interpretations made by Dr. Nicole Kindred. The full report will follow in a separate note.

## 2020-06-21 NOTE — Progress Notes (Unsigned)
NEUROPSYCHOLOGICAL TEST SCORES [facility]  Patient Name: Deborah Simon MRN: [id number] Date of Birth: May 23, 1951 Age: 70 y.o. Education: 12 years  Measurement properties of test scores: IQ, Index, and Standard Scores (SS): Mean = 100; Standard Deviation = 15 Scaled Scores (Ss): Mean = 10; Standard Deviation = 3 Z scores (Z): Mean = 0; Standard Deviation = 1 T scores (T); Mean = 50; Standard Deviation = 10  TEST SCORES:    Note: This summary of test scores accompanies the interpretive report and should not be interpreted by unqualified individuals or in isolation without reference to the report. Test scores are relative to age, gender, and educational history as available and appropriate.   Performance Validity        The Dot Counting Test: Raw Descriptor      E-Score 14 Within Expectation  "A" Random Letter Test Errors 2 Within Expectation      Embedded Measures: Raw Descriptor      RBANS Effort Index: 3 Within Expectation      WAIS-IV Reliable Digit Span 8 Within Expectation      WAIS-IV Reliable Digit Span Revised 11 Within Expectation      Expected Functioning        Wide Range Achievement Test (Word Reading): Standard/Scaled Score Percentile       Word Reading 68 2      Reynolds Intellectual Screening Test Standard/T-score Percentile      Guess What 33 5      Odd Item Out 33 5  RIST Index 77 6      Cognitive Testing        RBANS, Form : Standard/Scaled Score Percentile  Total Score 56 <1  Immediate Memory 53 <1      List Learning 2 <1      Story Memory 3 1  Visuospatial/Constructional 66 1      Figure Copy   (16) 6 9      Judgment of Line Orientation   (7) --- <2  Language 82 12      Picture Naming --- 17-25      Semantic Fluency 3 1  Attention 85 16      Digit Span 11 63      Coding 4 2  Delayed Memory 44 <1      List Recall   (0) --- <2      List Recognition   (13) --- <2      Story Recall   (2) 3 1      Figure Recall   (1) 1 <1       Wechsler Adult Intelligence Scale - IV: Standard/Scaled Score Percentile  Working Memory Index 77 6      Digit Span 7 16          Digit Span Forward 9 37          Digit Span Backward 6 9          Digit Span Sequencing 7 16      Arithmetic 5 5  Processing Speed Index 76 5      Symbol Search 4 2      Coding 7 16      Neuropsychological Assessment Battery (Language Module): T-score Percentile      Naming   (17) 19 <1      Verbal Fluency: T-score Percentile      Controlled Oral Word Association (F-A-S) 39 14      Semantic Fluency (Animals) 37 9  Trail Making Test: T-Score Percentile      Part A 33 5      Part B DC DC      Modified Wisconsin Card Sorting Test (MWCST): Standard/T-Score Percentile      Number of Categories Correct 19 <1      Number of Perseverative Errors 27 1      Number of Total Errors 24 <1      Percent Perseverative Errors 38 12  Executive Function Composite 55 <1      Boston Diagnostic Aphasia Exam: Raw Score Scaled Score      Complex Ideational Material 7 2      Clock Drawing Raw Score Descriptor      Command 4 Moderate Impairment      Rating Scales         Raw Score Descriptor  GAD-7 4 Within Normal Limits  Geriatric Depression Scale - Short Form 2 Negative      Clinical Dementia Rating Raw Score Descriptor      Sum of Boxes 0.0 Normal      Global Score 0.0 Normal      Quick Dementia Rating System Raw Score Descriptor      Sum of Boxes 4.5 Mild Dementia      Total Score 5 MCI   Ondine Gemme V. Roseanne Reno PsyD, ABN Clinical Neuropsychologist

## 2020-06-22 NOTE — Progress Notes (Signed)
NEUROPSYCHOLOGICAL EVALUATION [facility]  Patient Name: Deborah Simon MRN: [id number] Date of Birth: Aug 06, 1950 Age: 70 y.o. Education: 12 years  Clinical Impressions  Deborah Simon is a 70 y.o., right-hand dominant, woman with a history of HTN, HLD, DM2, and memory problems that have been progressive over the past two years. She also has some behavioral disturbance in the form of anxiety, agitation, and repetitive motor behaviors (rubbing legs, wringing hands, etc.). She has some rumination and mental stickiness. They notice no very prominent language or other behavioral symptoms. She has started to have more functional impairment, she stopped driving entirely several months ago because she got lost and is also requiring help with her finances over the past 8 months or so. Her neuroimaging shows some volume loss, with a bit of focality in the right temporal lobe, although it is not striking.   Neuropsychological testing shows evidence of limited premorbid abilities with an unusually low score for overall cognitive ability and word reading at an extremely low level. These findings temper expectations for her test performance significantly. Nevertheless, she did perform below expectations relative to that standard on measures of memory, language, executive functioning, and visuospatial/constructional functioning. She has fairly striking naming problems, and while some of the items she missed were likely on the basis of limited vocabulary knowledge, the majority of them seem to represent bona-fide word finding problems. Her auditory comprehension was also questionable. 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 I think she has reached a dementia level of functioning, considering her test data and functional impairment.   [name] is thus manifesting a dementia syndrome that is likely to be neurodegenerative, her imaging and profound naming problems  raise consideration of so-called "right temporal variant" frontotemporal dementia, which is pathologically heterogeneous. Her phenotype and imaging are not clearly convincing though and she may simply have Alzheimer's with a bit more focality on the right, which would also explain her test findings. If further pathological clarity is desired, an LP for AD biomarkers would likely be the best next step, although it would not substantially change her prognosis. I would have a low threshold for such a study, however, if she develops signs of motor neuron disease, as corticospinal tract degeneration and motor neuron disease are more common in right-temporal variant cases, with around 28% of these patients developing one of these pathologies as per recent case series (see Ulugut et al., 2021). There may be a more minor vascular contributor but it would not account for her presentation.   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 [phone]). Right temporal variant frontotemporal dementia is pathologically heterogeneous: A case-series and a systematic review. Acta neuropathologica communications (9), 131.   Recommendations to be discussed with patient  Your performance and presentation on assessment were consistent with fairly significant difficulties in several areas, most notably on measures of memory, executive function, on naming, and also with visuospatial/constructional functioning (e.g., cognitive processing of visual information). In conjunction with your families report that you are having problems driving and with managing finances and doing other complex things, I think the best diagnosis is dementia.   Dementia refers to a group of syndromes where multiple areas of ability are damaged in the brain, such as memory, thinking,  judgment, and behavior, and most commonly refers to age related causes of dementia that cause worsening in these  abilities over time. Alzheimer's disease is the most common form of dementia in people over the age of 53. Not all dementias are Alzheimer's disease, but all Alzheimer's disease is dementia. When dementia is due to an underlying condition affecting the brain, such as Alzheimer's disease, there is progression over time, which typically proceeds gradually over many years.   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 Alzheimer's is the most common neurodegenerative condition in individuals your age although I am not sure that is what is causing your problem. You have a bit of shrinkage in a certain part of your brain that is associated with a different condition, the so-called "right temporal variant frontotemporal dementia." One possibility is that you have a right temporal variant FTD, this is a rare condition, and your volume loss and clinical presentation are not clearly convincing. The other possibility is that you simply have Alzheimer's disease with a little bit more atrophy on the right than the left, because Alzheimer's disease also causes volume loss in this area.  Typically, right-frontal variant frontotemporal dementia involves memory loss like Alzheimer's disease but it also often involves language problems (which you do have) and can involve behavior changes. Prosopagnosia, or difficulty recognizing people, is the most distinctive feature of the disease but you did not report that. It likely does not change your prognosis or management to figure out which condition is responsible for your dementia, so you may not need further workup. If you develop other signs and symptoms though, such as movement problems, then it might make sense to look into it further because it would guide your prognosis. Movement problems are uncommon in  Alzheimer's disease and they are more common in right-temporal variant FTD.   It is important to understand that both of these conditions are neurodegenerative, and thus there will be progression over time.   The mainstay of treatment involves supportive care, such as getting help with things that have become hard for you, which your family is already doing. I would also recommend that they help you establish a routine with productive, active behaviors, which can be things like going for walks, helping with household chores, engaging in hobbies, or the like. Most people do not age as gracefully as they otherwise might if they are not doing much, and it sounds like you are watching a lot of TV.   My guess is that some of your anxiety may be due to your dementia condition. Instead of medications, I recommend behavioral strategies for dealing with the behavioral and psychological issues that can accompany dementia. Things like agitation, wandering, and anxiety can often be improved or eliminated using the "three R's." Redirection (help distract your loved one by focusing their attention on something else, moving them to a new environment, or otherwise engaging them in something other than what is distressing to them), Reassurance (reassure them that you are there to take care of them and that there is nothing they need to be worried about), and Reconsidering (consider the situation from their perspective and try to identify if there is something about the situation or environment that may be triggering their reaction).  There are also things you can do that can potentially slow the rate of cognitive decline and disease progression. These generally include lifestyle choices, such as diet and exercise. You also want to keep close control of your diabetes, which is a risk factor for dementia, and make sure you are  taking care of other underlying health problems.   There is now good quality evidence from at least  one large scale study that a modified mediterranean diet may help slow cognitive decline. This is known as the "MIND" diet. The Mind diet is not so much a specific diet as it is a set of recommendations for things that you should and should not eat.   Foods that are ENCOURAGED on the MIND Diet:  Green, leafy vegetables: Aim for six or more servings per week. This includes kale, spinach, cooked greens and salads.  All other vegetables: Try to eat another vegetable in addition to the green leafy vegetables at least once a day. It is best to choose non-starchy vegetables because they have a lot of nutrients with a low number of calories.  Berries: Eat berries at least twice a week. There is a plethora of research on strawberries, and other berries such as blueberries, raspberries and blackberries have also been found to have antioxidant and brain health benefits.  Nuts: Try to get five servings of nuts or more each week. The creators of the [employer] don't specify what kind of nuts to consume, but it is probably best to vary the type of nuts you eat to obtain a variety of nutrients. Peanuts are a legume and do not fall into this category.  Olive oil: Use olive oil as your main cooking oil. There may be other heart-healthy alternatives such as algae oil, though there is not yet sufficient research upon which to base a formal recommendation.  Whole grains: Aim for at least three servings daily. Choose minimally processed grains like oatmeal, quinoa, brown rice, whole-wheat pasta and 100% whole-wheat bread.  Fish: Eat fish at least once a week. It is best to choose fatty fish like salmon, sardines, trout, tuna and mackerel for their high amounts of omega-3 fatty acids.  Beans: Include beans in at least four meals every week. This includes all beans, lentils and soybeans.  Poultry: Try to eat chicken or [place] at least twice a week. Note that fried chicken is not encouraged on the MIND diet.  Wine: Aim for no  more than one glass of alcohol daily. Both red and white wine may benefit the brain. However, much research has focused on the red wine compound resveratrol, which may help protect against Alzheimer's disease.  Foods that are DISCOURAGED on the MIND Diet: Butter and margarine: Try to eat less than 1 tablespoon (about 14 grams) daily. Instead, try using olive oil as your primary cooking fat, and dipping your bread in olive oil with herbs.  Cheese: The MIND diet recommends limiting your cheese consumption to less than once per week.  Red meat: Aim for no more than three servings each week. This includes all beef, pork, lamb and products made from these meats.  [place] food: The MIND diet highly discourages fried food, especially the kind from fast-food restaurants. Limit your consumption to less than once per week.  Pastries and sweets: This includes most of the processed junk food and desserts you can think of. Ice cream, cookies, brownies, snack cakes, donuts, candy and more. Try to limit these to no more than four times a week.  Exercise is one of the best medicines for promoting health and maintaining cognitive fitness at all stages in life. Exercise probably has the largest documented effect on brain health and performance of any lifestyle intervention. Studies have shown that even previously sedentary individuals who start exercising as late  as age 86 show a significant survival benefit as compared to their non-exercising peers. In the [place], the current guidelines are for 30 minutes of moderate exercise per day, but increasing your activity level less than that may also be helpful. You do not have to get your 30 minutes of exercise in one shot and exercising for short periods of time spread throughout the day can be helpful. Go for several walks, learn to dance, or do something else you enjoy that gets your body moving. Of course, if you have an underlying medical condition or there is any  question about whether it is safe for you to exercise, you should consult a medical treatment provider prior to beginning exercise.   Test Findings  Test scores are summarized in additional documentation associated with this encounter. Test scores are relative to age, gender, and educational history as available and appropriate. There were no concerns about performance validity as all findings fell within normal expectations.   General Intellectual Functioning/Achievement:  Ms. Nicole demonstrated unusually low overall intellectual performance on the RIST index, which substantially tempers expectations for her cognitive test performance. Her word reading was extremely low, which further suggests the need for a somewhat lower standard of comparison.   Attention and Processing Efficiency: Performance on indicators of attention and working memory was unusually low on the Working Memory Index of the WAIS-IV, although in lieu of the above findings, it may not necessarily represent a decline for this patient. Performance was average on two measures of digit repetition forward, whereas digit repetition backward and solving a series of mental arithmetic problems without paper and pencil were unusually low. Digit resequencing in ascending order was low average.   Performance was low on timed measures of processing speed, with an overall score on the Processing Speed index in the unusually low range. Similar to attention and working memory, I am unsure whether this represents a true decline for this patient, although it may. Performance was low average on one measure of timed number-symbol coding and extremely low on another. Efficient visual matching and efficient visual scanning was extremely low.   Language: Performance on language measures showed profound naming problems and borderline comprehension ability, although she did better on measures of verbal fluency and repetition. There was no clear loss of  object knowledge and there were no clear two-way naming deficits.   Visuospatial Function: Visuospatial and constructional performance was extremely low, which is likely a decline. Figure copy was unusually low, without gross distortion but with inaccuracies in details. Judgment of angular line orientations was extremely low.   Learning and Memory: Performance on measures of learning and memory was low, with extremely low scores for immediate and delayed recall. She did not cue up but she did retain just a bit of information across time. I think she likely has a memory storage problem although very dense encoding issues can present similarly.   In the verbal realm, immeddiate and delayed recall for a 10-item word list and short story were extremely low. Recognition of the words contained amongst false positive errors was extremely low.   In the visual realm, she demonstrated an extremely low score when recalling a modestly complex figure she had been asked to copy earlier.   Executive Functions: Performance on executive measures was generally low and fairly significant dysfunction is suspected. She performed at an extremely low level on the Modified [employer]. She had issues with both perseveration and lack of category solutions. She scored  in the extremely low range when reasoning with verbal information on the Complex [employer]. She had such difficulty with Trail Making Test B that it had to be discontinued, suggesting problems. Clock drawing was suggestive of "mild impairment" with numbers in a counter clockwise direction and no hands. Generation of words was a lone area of strength, and fell at an low average level.   Rating Scale(s): Ms. Cerro screened negative for the presence of clinically significant anxiety and depression. Nevertheless, she likely does have some anxiety given her families report of her functioning. She was characterized by her daughter in law  as somewhere between an MCI and mild dementia level of function. Given that she does meet criteria for dementia, I think that she has the condition.   Bettye Boeck Roseanne Reno PsyD, ABN Clinical Neuropsychologist

## 2020-06-25 ENCOUNTER — Other Ambulatory Visit: Payer: Self-pay | Admitting: Family Medicine

## 2020-06-25 DIAGNOSIS — E119 Type 2 diabetes mellitus without complications: Secondary | ICD-10-CM

## 2020-06-25 NOTE — Addendum Note (Signed)
Addended by: Herminio Commons A on: 06/25/2020 04:31 PM   Modules accepted: Orders

## 2020-06-25 NOTE — Telephone Encounter (Signed)
Ok to refill 

## 2020-06-25 NOTE — Telephone Encounter (Signed)
Pt's daughter informed me that she is not taking ibuprofen that she will take tylenol if needed for headache or any aches or pains

## 2020-06-25 NOTE — Telephone Encounter (Signed)
Please let her know that the Etodalac is the same class of medication as ibuprofen and she should not take both. Also, this medication increases the risk of GI bleeding especially when taken with her anxiety medication Citalopram. Please be aware.

## 2020-06-27 ENCOUNTER — Encounter: Payer: Self-pay | Admitting: Counselor

## 2020-06-27 ENCOUNTER — Ambulatory Visit (INDEPENDENT_AMBULATORY_CARE_PROVIDER_SITE_OTHER): Payer: Medicare HMO | Admitting: Counselor

## 2020-06-27 ENCOUNTER — Other Ambulatory Visit: Payer: Self-pay

## 2020-06-27 DIAGNOSIS — F0390 Unspecified dementia without behavioral disturbance: Secondary | ICD-10-CM

## 2020-06-27 NOTE — Patient Instructions (Signed)
Your performance and presentation on assessment were consistent with fairly significant difficulties in several areas, most notably on measures of memory, executive function, on naming, and also with visuospatial/constructional functioning (e.g., cognitive processing of visual information). In conjunction with your families report that you are having problems driving and with managing finances and doing other complex things, I think the best diagnosis is dementia.   Dementia refers to a group of syndromes where multiple areas of ability are damaged in the brain, such as memory, thinking, judgment, and behavior, and most commonly refers to age related causes of dementia that cause worsening in these abilities over time. Alzheimer's disease is the most common form of dementia in people over the age of 36. Not all dementias are Alzheimer's disease, but all Alzheimer's disease is dementia. When dementia is due to an underlying condition affecting the brain, such as Alzheimer's disease, there is progression over time, which typically proceeds gradually over many years.   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 Alzheimer's is the most common neurodegenerative condition in individuals your age although I am not sure that is what is causing your problem. You have a bit of shrinkage in a certain part of your brain that is associated with a different condition, the so-called "right temporal variant frontotemporal dementia." One possibility is that you have a right temporal variant FTD, this is a rare condition, and your volume loss and clinical presentation are not clearly convincing. The other possibility is that you simply have Alzheimer's disease with a little bit more atrophy on the right than the left, because Alzheimer's disease also causes volume loss in this area.  Typically, right-frontal variant frontotemporal dementia involves memory loss  like Alzheimer's disease but it also often involves language problems (which you do have) and can involve behavior changes. Prosopagnosia, or difficulty recognizing people, is the most distinctive feature of the disease but you did not report that. It likely does not change your prognosis or management to figure out which condition is responsible for your dementia, so you may not need further workup. If you develop other signs and symptoms though, such as movement problems, then it might make sense to look into it further because it would guide your prognosis. Movement problems are uncommon in Alzheimer's disease and they are more common in right-temporal variant FTD.   It is important to understand that both of these conditions are neurodegenerative, and thus there will be progression over time.   The mainstay of treatment involves supportive care, such as getting help with things that have become hard for you, which your family is already doing. I would also recommend that they help you establish a routine with productive, active behaviors, which can be things like going for walks, helping with household chores, engaging in hobbies, or the like. Most people do not age as gracefully as they otherwise might if they are not doing much, and it sounds like you are watching a lot of TV.   My guess is that some of your anxiety may be due to your dementia condition. Instead of medications, I recommend behavioral strategies for dealing with the behavioral and psychological issues that can accompany dementia. Things like agitation, wandering, and anxiety can often be improved or eliminated using the "three R's." Redirection (help distract your loved one by focusing their attention on something else, moving them to a new environment, or otherwise engaging them in something other than what is  distressing to them), Reassurance (reassure them that you are there to take care of them and that there is nothing they need  to be worried about), and Reconsidering (consider the situation from their perspective and try to identify if there is something about the situation or environment that may be triggering their reaction).  There are also things you can do that can potentially slow the rate of cognitive decline and disease progression. These generally include lifestyle choices, such as diet and exercise. You also want to keep close control of your diabetes, which is a risk factor for dementia, and make sure you are taking care of other underlying health problems.   There is now good quality evidence from at least one large scale study that a modified mediterranean diet may help slow cognitive decline. This is known as the "MIND" diet. The Mind diet is not so much a specific diet as it is a set of recommendations for things that you should and should not eat.   Foods that are ENCOURAGED on the MIND Diet:  Green, leafy vegetables: Aim for six or more servings per week. This includes kale, spinach, cooked greens and salads.  All other vegetables: Try to eat another vegetable in addition to the green leafy vegetables at least once a day. It is best to choose non-starchy vegetables because they have a lot of nutrients with a low number of calories.  Berries: Eat berries at least twice a week. There is a plethora of research on strawberries, and other berries such as blueberries, raspberries and blackberries have also been found to have antioxidant and brain health benefits.  Nuts: Try to get five servings of nuts or more each week. The creators of the MIND diet don't specify what kind of nuts to consume, but it is probably best to vary the type of nuts you eat to obtain a variety of nutrients. Peanuts are a legume and do not fall into this category.  Olive oil: Use olive oil as your main cooking oil. There may be other heart-healthy alternatives such as algae oil, though there is not yet sufficient research upon which to base  a formal recommendation.  Whole grains: Aim for at least three servings daily. Choose minimally processed grains like oatmeal, quinoa, brown rice, whole-wheat pasta and 100% whole-wheat bread.  Fish: Eat fish at least once a week. It is best to choose fatty fish like salmon, sardines, trout, tuna and mackerel for their high amounts of omega-3 fatty acids.  Beans: Include beans in at least four meals every week. This includes all beans, lentils and soybeans.  Poultry: Try to eat chicken or [place] at least twice a week. Note that fried chicken is not encouraged on the MIND diet.  Wine: Aim for no more than one glass of alcohol daily. Both red and white wine may benefit the brain. However, much research has focused on the red wine compound resveratrol, which may help protect against Alzheimer's disease.  Foods that are DISCOURAGED on the MIND Diet: Butter and margarine: Try to eat less than 1 tablespoon (about 14 grams) daily. Instead, try using olive oil as your primary cooking fat, and dipping your bread in olive oil with herbs.  Cheese: The MIND diet recommends limiting your cheese consumption to less than once per week.  Red meat: Aim for no more than three servings each week. This includes all beef, pork, lamb and products made from these meats.  [place] food: The MIND diet highly discourages fried  food, especially the kind from fast-food restaurants. Limit your consumption to less than once per week.  Pastries and sweets: This includes most of the processed junk food and desserts you can think of. Ice cream, cookies, brownies, snack cakes, donuts, candy and more. Try to limit these to no more than four times a week.  Exercise is one of the best medicines for promoting health and maintaining cognitive fitness at all stages in life. Exercise probably has the largest documented effect on brain health and performance of any lifestyle intervention. Studies have shown that even previously sedentary  individuals who start exercising as late as age 34 show a significant survival benefit as compared to their non-exercising peers. In the [place], the current guidelines are for 30 minutes of moderate exercise per day, but increasing your activity level less than that may also be helpful. You do not have to get your 30 minutes of exercise in one shot and exercising for short periods of time spread throughout the day can be helpful. Go for several walks, learn to dance, or do something else you enjoy that gets your body moving. Of course, if you have an underlying medical condition or there is any question about whether it is safe for you to exercise, you should consult a medical treatment provider prior to beginning exercise.

## 2020-06-27 NOTE — Progress Notes (Signed)
[facility]  Feedback Note: I met with Deborah Simon to review the findings resulting from her neuropsychological evaluation. Since the last appointment, she has been about the same. Time was spent reviewing the impressions and recommendations that are detailed in the evaluation report. We discussed impression of mild dementia, which is mostly likely due to AD although there is some consideration of right temporal variant AD given very low visual object naming and atrophy pattern. Her son once again denied any prosopagnosia, clear semantic problems, or movement symptoms. I took time to explain the findings and answer all the patient's questions. I encouraged Deborah Simon to contact me should she have any further questions or if further follow up is desired.   Current Medications and Medical History   Current Outpatient Medications  Medication Sig Dispense Refill  . albuterol (VENTOLIN HFA) 108 (90 Base) MCG/ACT inhaler Inhale into the lungs.    Marland Kitchen amLODipine (NORVASC) 5 MG tablet Take 5 mg by mouth daily.     Marland Kitchen aspirin EC 81 MG tablet Take 81 mg by mouth daily.    Marland Kitchen atorvastatin (LIPITOR) 40 MG tablet TAKE 1 TABLET BY MOUTH EVERY DAY 90 tablet 1  . Blood Glucose Monitoring Suppl (FREESTYLE FREEDOM LITE) W/DEVICE KIT 1 Units by Does not apply route once. Use to test blood glucose once a day 1 each 0  . cetirizine (ZYRTEC) 10 MG tablet Take 10 mg by mouth daily.    . citalopram (CELEXA) 20 MG tablet Take 1 tablet (20 mg total) by mouth daily. 90 tablet 1  . docusate sodium (COLACE) 100 MG capsule Take 100 mg by mouth 2 (two) times daily as needed for mild constipation.     Marland Kitchen donepezil (ARICEPT) 10 MG tablet TAKE 1 TABLET BY MOUTH EVERYDAY AT BEDTIME 30 tablet 5  . doxycycline (VIBRA-TABS) 100 MG tablet Take 1 tablet (100 mg total) by mouth 2 (two) times daily. 20 tablet 0  . etodolac (LODINE) 400 MG tablet TAKE 1 TABLET BY MOUTH TWICE A DAY 180 tablet 0  .  ferrous sulfate 325 (65 FE) MG tablet Take 325 mg by mouth daily with breakfast.     . hydrochlorothiazide (HYDRODIURIL) 25 MG tablet Take 1 tablet (25 mg total) by mouth daily. 90 tablet 1  . losartan (COZAAR) 50 MG tablet TAKE 1 TABLET BY MOUTH EVERY DAY 90 tablet 0  . memantine (NAMENDA) 10 MG tablet Take 1 tablet (10 mg total) by mouth 2 (two) times daily. 180 tablet 1  . memantine (NAMENDA) 5 MG tablet Take 1 tablet PO daily for 1 week then increase to 1 tablet twice a day thereafter 60 tablet 5  . metFORMIN (GLUCOPHAGE) 500 MG tablet TAKE 1 TABLET (500 MG TOTAL) BY MOUTH 2 (TWO) TIMES DAILY WITH A MEAL. 60 tablet 0  . metoprolol succinate (TOPROL-XL) 25 MG 24 hr tablet TAKE 1 TABLET BY MOUTH DAILY 90 tablet 0  . montelukast (SINGULAIR) 10 MG tablet Take 10 mg by mouth at bedtime.      Current Facility-Administered Medications  Medication Dose Route Frequency Provider Last Rate Last Admin  . 0.9 %  sodium chloride infusion  500 mL Intravenous Once Doran Stabler, MD        Patient Active Problem List   Diagnosis Date Noted  . Diabetic retinopathy associated with controlled type 2 diabetes mellitus ([place]) 01/25/2020  . 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 Hyporeflexia 08/11/2019  . Gait  abnormality 08/11/2019  . Intention tremor 08/11/2019  . Fatty liver 07/12/2019  . Chronic RUQ pain 07/12/2019  . Type 2 diabetes mellitus with chronic kidney disease, without long-term current use of insulin ([place]) 07/05/2019  . Right sided abdominal pain 07/05/2019  . History of cholecystectomy 07/05/2019  . Hypercalcemia 07/05/2019  . Enlarged thyroid gland 07/05/2019  . Intermittent tremor 06/30/2019  . Side pain 06/30/2019  . Memory difficulty 06/30/2019  . Cough 08/15/2014  . Unspecified constipation 10/30/2012  . Routine general medical examination at a health care facility 10/04/2012  . Asthma, mild persistent 08/12/2012  . External hemorrhoids without  complication 30/02/7948  . GERD (gastroesophageal reflux disease) 11/13/2011  . Chronic cholecystitis with calculus 09/01/2011  . Diabetes mellitus without complication ([place]) 97/18/2099  . Hyperlipidemia associated with type 2 diabetes mellitus ([place]) 03/07/2009  . ALLERGIC RHINITIS 04/28/2008  . BACK PAIN, LUMBAR, CHRONIC 08/17/2007  . OBESITY 01/07/2007  . Essential hypertension 09/01/2006    Mental Status and Behavioral Observations  Deborah Simon presented on time to the present encounter and was not oriented, reported date as "December" and not sure of year and date. Speech was normal in rate, rhythm, volume, and prosody. Self-reported mood was "good" and affect was euthymic. Thought process was logical and goal oriented, although she did not say much and deferred to her son. Thought content was appropriate to the topics discussed. There were no safety concerns identified at today's encounter, such as thoughts of harming self or others.   Plan  Feedback provided regarding the patient's neuropsychological evaluation. She is motivated to engage in healthy lifestyle changes and her son will help (he is a Physiological scientist and was interested in exercise). I also cautioned them about the use of prevagen, which she had started recently because they thought it would help.  Deborah Simon was encouraged to contact me if any questions arise or if further follow up is desired.   Viviano Simas Nicole Kindred, PsyD, ABN Clinical Neuropsychologist  Service(s) Provided at This Encounter: 27 minutes [phone]; Conjoint therapy with patient present)

## 2020-07-07 ENCOUNTER — Other Ambulatory Visit: Payer: Self-pay | Admitting: Family Medicine

## 2020-07-27 ENCOUNTER — Other Ambulatory Visit: Payer: Self-pay | Admitting: Family Medicine

## 2020-07-27 DIAGNOSIS — E119 Type 2 diabetes mellitus without complications: Secondary | ICD-10-CM

## 2020-07-27 NOTE — Telephone Encounter (Signed)
Has upcoming appt °

## 2020-08-01 NOTE — Progress Notes (Deleted)
  Subjective:    Patient ID: Deborah Simon, female    DOB: Apr 17, 1951, 70 y.o.   MRN: [id number]  Deborah Simon is a 70 y.o. female who presents for follow-up of Type 2 diabetes mellitus.  Patient {is/are not:32546} checking home blood sugars.   Home blood sugar records: {dm home sugars:14018} How often is blood sugars being checked: *** Current symptoms include: {dm sx:14075}. Patient denies {dm sx:19199}.  Patient {is/are not:32546} checking their feet daily. Any Foot concerns (callous, ulcer, wound, thickened nails, toenail fungus, skin fungus, hammer toe): *** Last dilated eye exam: ***  Current treatments: {dm interventions:14074}. Medication compliance: {good/fair/poor:33178}  Current diet: {diet habits:16563} Current exercise: {exercise types:16438} Known diabetic complications: {diabetes complications:1215}  The following portions of the patient's history were reviewed and updated as appropriate: allergies, current medications, past medical history, past social history and problem list.  ROS as in subjective above.     Objective:    Physical Exam Alert and in no distress otherwise not examined.  There were no vitals taken for this visit.  Lab Review Diabetic Labs Latest Ref Rng & Units 01/25/2020 10/05/2019 07/05/2019 06/30/2019 11/16/2015  HbA1c 4.8 - 5.6 % 6.6(H) - - 6.9(H) 6.4  Chol 100 - 199 mg/dL 462 - - 703 -  HDL >50 mg/dL 53 - - 56 -  Calc LDL 0 - 99 mg/dL 79 - - 84 -  Triglycerides 0 - 149 mg/dL 68 - - 79 -  Creatinine 0.57 - 1.00 mg/dL 0.93(G) 1.82(X) 9.37 1.69(C) 1.00(H)   BP/Weight 04/26/2020 04/20/2020 04/03/2020 02/28/2020 01/25/2020  Systolic BP 120 159 120 160 130  Diastolic BP 80 78 76 94 80  Wt. (Lbs) 167 165 168.2 160 170.8  BMI 28.67 28.32 30.76 29.26 31.24   Foot/eye exam completion dates 11/16/2015 09/27/2014  Foot Form Completion Done Done    Deborah Simon  reports that she has never smoked. She has never used smokeless tobacco. She reports that she does  not drink alcohol and does not use drugs.     Assessment & Plan:    Type 2 diabetes mellitus with chronic kidney disease, without long-term current use of insulin, unspecified CKD stage (HCC)  Diabetic retinopathy associated with controlled type 2 diabetes mellitus (HCC)  1. Rx changes: {none:33079} 2. Education: Reviewed 'ABCs' of diabetes management (respective goals in parentheses):  A1C (<7), blood pressure (<130/80), and cholesterol (LDL <100). 3. Compliance at present is estimated to be {good/fair/poor:33178}. Efforts to improve compliance (if necessary) will be directed at {compliance:16716}. 4. Follow up: {NUMBERS; 0-10:33138} {time:11}

## 2020-08-02 ENCOUNTER — Ambulatory Visit: Payer: Medicare Other | Admitting: Family Medicine

## 2020-08-02 DIAGNOSIS — E11319 Type 2 diabetes mellitus with unspecified diabetic retinopathy without macular edema: Secondary | ICD-10-CM

## 2020-08-02 DIAGNOSIS — E1122 Type 2 diabetes mellitus with diabetic chronic kidney disease: Secondary | ICD-10-CM

## 2020-08-05 ENCOUNTER — Other Ambulatory Visit: Payer: Self-pay | Admitting: Family Medicine

## 2020-08-05 DIAGNOSIS — E1169 Type 2 diabetes mellitus with other specified complication: Secondary | ICD-10-CM

## 2020-08-05 DIAGNOSIS — E785 Hyperlipidemia, unspecified: Secondary | ICD-10-CM

## 2020-08-06 ENCOUNTER — Encounter: Payer: Self-pay | Admitting: Family Medicine

## 2020-08-09 ENCOUNTER — Other Ambulatory Visit: Payer: Self-pay | Admitting: Family Medicine

## 2020-08-09 DIAGNOSIS — E119 Type 2 diabetes mellitus without complications: Secondary | ICD-10-CM

## 2020-08-09 NOTE — Telephone Encounter (Signed)
Spoke to daughter in Development worker, international aid. Pt is out of town and won't be back for a couple weeks. She is schedule for 3/14 . She does not need meds at this time

## 2020-08-15 ENCOUNTER — Telehealth: Payer: Self-pay

## 2020-08-15 NOTE — Telephone Encounter (Signed)
-----   Message from Lilla Shook, RN sent at 08/15/2020  1:11 PM EST -----  ----- Message ----- From: Butch Penny, NP Sent: 08/15/2020   9:45 AM EST To: Guy Begin, RN    ----- Message ----- From: Huston Foley, MD Sent: 07/25/2020   4:17 PM EST To: Butch Penny, NP  I have not requested LP for AD markers. We may want to consider eval and management through an academic cognitive clinic/memory disorders center. What do you think? sa ----- Message ----- From: Butch Penny, NP Sent: 07/23/2020   3:34 PM EST To: Huston Foley, MD  Please read report. Clayborn Heron sent me a message recommending LP for AD biomarkers. Do you typically do this for right temporal variant FTD?

## 2020-08-15 NOTE — Telephone Encounter (Signed)
I called patient's daughter-in-law Jasmine December (ok per [employer]) and discussed the report received by Clayborn Heron.  I advised Dr. Frances Furbish and Aundra Millet M,NP had discussed the report and have recommended possibly bringing the back the patient back in sooner for an appointment or setting her up with an academic center for with a cognitive clinic/memory disorders clinc.  Patient's daughter-in-law is not opposed to the academic center but she wants to talk with her husband, the patient's son about this. She states that the patient gets very nervous when she goes to appointments that focus on her memory. Patient is scheduled for April 25th with our office and for now we will keep follow-up as scheduled.  Patient's daughter will call back and let us know how she would like to proceed once she has spoke with the pt's son.

## 2020-08-15 NOTE — Progress Notes (Signed)
Can you see if the patient wants to come in sooner to discuss results from neuropsychology

## 2020-08-27 ENCOUNTER — Ambulatory Visit: Payer: Medicare HMO | Admitting: Family Medicine

## 2020-09-12 ENCOUNTER — Other Ambulatory Visit: Payer: Self-pay | Admitting: Family Medicine

## 2020-09-12 DIAGNOSIS — I10 Essential (primary) hypertension: Secondary | ICD-10-CM

## 2020-09-13 NOTE — Telephone Encounter (Signed)
Has an appt in april

## 2020-09-20 ENCOUNTER — Encounter: Payer: Self-pay | Admitting: Family Medicine

## 2020-09-24 ENCOUNTER — Encounter: Payer: Self-pay | Admitting: Family Medicine

## 2020-09-24 ENCOUNTER — Telehealth (INDEPENDENT_AMBULATORY_CARE_PROVIDER_SITE_OTHER): Payer: Medicare HMO | Admitting: Family Medicine

## 2020-09-24 VITALS — Wt 170.0 lb

## 2020-09-24 DIAGNOSIS — R053 Chronic cough: Secondary | ICD-10-CM | POA: Diagnosis not present

## 2020-09-24 MED ORDER — BENZONATATE 200 MG PO CAPS: 200.0000 mg | ORAL_CAPSULE | Freq: Two times a day (BID) | ORAL | 0 refills | Status: DC | PRN

## 2020-09-24 NOTE — Progress Notes (Signed)
   Subjective:  Documentation for virtual audio and video telecommunications through Caregility encounter:  The patient was located at home. 2 patient identifiers used.  The provider was located in the office. The patient did consent to this visit and is aware of possible charges through their insurance for this visit.  The other persons participating in this telemedicine service were her daughter-in-law. Time spent on call was 14 minutes and in review of previous records 18 minutes total.  This virtual service is not related to other E/M service within previous 7 days.   Patient ID: Deborah Simon, female    DOB: [birth year], 70 y.o.   MRN: [id number]  HPI Chief Complaint  Patient presents with  . Cough    Chronic cough x 2 months. Comes and goes.tried lots of OTC meds and no relief.    Complains of chronic dry cough x 2 months. Present at random times.  Does not seem to be worse at night or after eating.  States she feels like it is an upper throat issue and not her lungs.  Denies fever, chills, chest pain, palpitations, shortness of breath, abdominal pain, nausea, vomiting or diarrhea.  No LE edema.  Taking Zyrtec every day. This has helped with rhinorrhea.   Reports taking Delsym, Robitussin, Chloraseptic spray, cough drops    Review of Systems Pertinent positives and negatives in the history of present illness.     Objective:   Physical Exam Wt 170 lb (77.1 kg)   LMP  (LMP Unknown)   BMI 29.18 kg/m   Alert and oriented in no acute distress.  Respirations unlabored.  She is speaking in complete sentences without difficulty.  No cough during the visit.      Assessment & Plan:  Persistent dry cough - Plan: benzonatate (TESSALON) 200 MG capsule  No red flag symptoms.  Counseling on common causes for dry cough.  Recommend she continue with Zyrtec and add a PPI.  She will get this over-the-counter.  Counseling on lifestyle modifications for acid reflux in case this is the  underlying issue.  She will follow-up in 2 to 4 weeks or sooner if needed.

## 2020-09-25 ENCOUNTER — Encounter: Payer: Self-pay | Admitting: Family Medicine

## 2020-09-30 ENCOUNTER — Other Ambulatory Visit: Payer: Self-pay | Admitting: Family Medicine

## 2020-10-01 NOTE — Telephone Encounter (Signed)
Pt has been taking this for side pain but will discuss Thursday at appt

## 2020-10-01 NOTE — Telephone Encounter (Signed)
Ok to refill 

## 2020-10-03 NOTE — Progress Notes (Signed)
Subjective:    Patient ID: Deborah Simon, female    DOB: 09-Aug-1950, 70 y.o.   MRN: [id number]  Eula Jaster is a 70 y.o. female who presents for follow-up of Type 2 diabetes mellitus.  Previous Hgb A1c 6.6% in 01/2020  Taking medications without any issues. States her daughter in law gives her medications and she is appreciative of her and her son's care.  She is walking on the treadmill now. More active.   Cough has improved.   Overdue for mammogram, bone density, diabetic eye exam, Tdap and pneumonia vaccine.   Patient is checking home blood sugars.   Home blood sugar records: not sure what they are How often is blood sugars being checked: checks a couple times a week Current symptoms include: none. Patient denies increased appetite, nausea, polyuria, visual disturbances, vomiting and weight loss.  Patient is checking their feet daily. Any Foot concerns (callous, ulcer, wound, thickened nails, toenail fungus, skin fungus, hammer toe): none Last dilated eye exam: overdue. Will schedule appt  Current treatments: doing well DM meds. Medication compliance: good  Current diet: in general, a "healthy" diet   Current exercise: bicycling and treadmill  Known diabetic complications: none  The following portions of the patient's history were reviewed and updated as appropriate: allergies, current medications, past medical history, past social history and problem list.  ROS as in subjective above.     Objective:    Physical Exam Alert and in no distress otherwise not examined. Respirations unlabored. Lungs are CTA. Extremities without edema. Skin is warm and dry. Normal foot exam.   Blood pressure 130/80, pulse 86, weight 168 lb 6.4 oz (76.4 kg), SpO2 97 %.  Lab Review Diabetic Labs Latest Ref Rng & Units 10/04/2020 01/25/2020 10/05/2019 07/05/2019 06/30/2019  HbA1c 4.0 - 5.6 % 6.6(A) 6.6(H) - - 6.9(H)  Chol 100 - 199 mg/dL - 761 - - 950  HDL >93 mg/dL - 53 - - 56  Calc LDL 0 - 99  mg/dL - 79 - - 84  Triglycerides 0 - 149 mg/dL - 68 - - 79  Creatinine 0.57 - 1.00 mg/dL - 2.67(T) 2.45(Y) 0.99 1.14(H)   BP/Weight 10/04/2020 09/24/2020 04/26/2020 04/20/2020 04/03/2020  Systolic BP 130 - 120 159 120  Diastolic BP 80 - 80 78 76  Wt. (Lbs) 168.4 170 167 165 168.2  BMI 28.91 29.18 28.67 28.32 30.76   Foot/eye exam completion dates 10/04/2020 11/16/2015  Foot Form Completion Done Done    Meriam Sprague  reports that she has never smoked. She has never used smokeless tobacco. She reports that she does not drink alcohol and does not use drugs.     Assessment & Plan:    Type 2 diabetes mellitus with chronic kidney disease, without long-term current use of insulin, unspecified CKD stage (HCC) - Plan: HgB A1c, CBC with Differential/Platelet, Comprehensive metabolic panel, TSH, Microalbumin / creatinine urine ratio  Hyperlipidemia associated with type 2 diabetes mellitus (HCC) - Plan: Lipid panel  Essential hypertension  Estrogen deficiency - Plan: DG Bone Density  Encounter for screening mammogram for malignant neoplasm of breast - Plan: MM DIGITAL SCREENING BILATERAL  Need for vaccination against Streptococcus pneumoniae  1. Rx changes: none 2. Education: Reviewed 'ABCs' of diabetes management (respective goals in parentheses):  A1C (<7), blood pressure (<130/80), and cholesterol (LDL <100). 3. Compliance at present is estimated to be good. Efforts to improve compliance (if necessary) will be directed at dietary modifications: limit sugar and carbohydrates, increased exercise and regular blood sugar  monitoring: daily. 4. HTN- blood pressure controlled. Continue medication and low sodium diet.  5. HL- continue statin and low fat diet. Follow up pending lipid panel.  6. Estrogen def- she will call and schedule and DEXA 7. Breast cancer screening- she will call and schedule mammogram  8. Follow up: 6 months

## 2020-10-04 ENCOUNTER — Encounter: Payer: Self-pay | Admitting: Family Medicine

## 2020-10-04 ENCOUNTER — Other Ambulatory Visit: Payer: Self-pay

## 2020-10-04 ENCOUNTER — Ambulatory Visit (INDEPENDENT_AMBULATORY_CARE_PROVIDER_SITE_OTHER): Payer: Medicare HMO | Admitting: Family Medicine

## 2020-10-04 VITALS — BP 130/80 | HR 86 | Wt 168.4 lb

## 2020-10-04 DIAGNOSIS — Z1231 Encounter for screening mammogram for malignant neoplasm of breast: Secondary | ICD-10-CM | POA: Diagnosis not present

## 2020-10-04 DIAGNOSIS — E1169 Type 2 diabetes mellitus with other specified complication: Secondary | ICD-10-CM | POA: Diagnosis not present

## 2020-10-04 DIAGNOSIS — E785 Hyperlipidemia, unspecified: Secondary | ICD-10-CM

## 2020-10-04 DIAGNOSIS — I10 Essential (primary) hypertension: Secondary | ICD-10-CM

## 2020-10-04 DIAGNOSIS — E1122 Type 2 diabetes mellitus with diabetic chronic kidney disease: Secondary | ICD-10-CM

## 2020-10-04 DIAGNOSIS — Z23 Encounter for immunization: Secondary | ICD-10-CM

## 2020-10-04 DIAGNOSIS — E2839 Other primary ovarian failure: Secondary | ICD-10-CM | POA: Diagnosis not present

## 2020-10-04 LAB — POCT GLYCOSYLATED HEMOGLOBIN (HGB A1C): Hemoglobin A1C: 6.6 % — AB (ref 4.0–5.6)

## 2020-10-04 NOTE — Patient Instructions (Addendum)
Your hemoglobin A1c is 6.6% and your diabetes is still well controlled.  Your blood pressure is 130/80 which is normal.  Continue on your current medications.  You are doing a good job of controlling your diabetes and blood pressure.  You received a pneumonia vaccine in our office today. Prevnar 13 per guidelines.   My records show that you are overdue for a mammogram and bone density test.   Please call [facility] to schedule these tests. I have placed the orders.   Please call and schedule a diabetic eye exam as well.  I also recommend that you get a tetanus shot (Tdap) at your local pharmacy since insurance will pay for it there but not in my office.  We will be in touch with your lab results from today.

## 2020-10-04 NOTE — Addendum Note (Signed)
Addended by: Herminio Commons A on: 10/04/2020 12:37 PM   Modules accepted: Orders

## 2020-10-06 ENCOUNTER — Encounter: Payer: Self-pay | Admitting: Family Medicine

## 2020-10-06 ENCOUNTER — Other Ambulatory Visit: Payer: Self-pay | Admitting: Family Medicine

## 2020-10-06 DIAGNOSIS — I10 Essential (primary) hypertension: Secondary | ICD-10-CM

## 2020-10-06 DIAGNOSIS — E1169 Type 2 diabetes mellitus with other specified complication: Secondary | ICD-10-CM

## 2020-10-07 LAB — LIPID PANEL
Chol/HDL Ratio: 2.6 ratio (ref 0.0–4.4)
Cholesterol, Total: 123 mg/dL (ref 100–199)
HDL: 47 mg/dL (ref >39)
LDL Chol Calc (NIH): 62 mg/dL (ref 0–99)
Triglycerides: 67 mg/dL (ref 0–149)
VLDL Cholesterol Cal: 14 mg/dL (ref 5–40)

## 2020-10-07 LAB — CBC WITH DIFFERENTIAL/PLATELET
Basophils Absolute: 0 10*3/uL (ref 0.0–0.2)
Basos: 0 %
EOS (ABSOLUTE): 0.4 10*3/uL (ref 0.0–0.4)
Eos: 5 %
Hematocrit: 33.4 % — ABNORMAL LOW (ref 34.0–46.6)
Hemoglobin: 11.2 g/dL (ref 11.1–15.9)
Immature Grans (Abs): 0 10*3/uL (ref 0.0–0.1)
Immature Granulocytes: 0 %
Lymphocytes Absolute: 2.4 10*3/uL (ref 0.7–3.1)
Lymphs: 34 %
MCH: 30.7 pg (ref 26.6–33.0)
MCHC: 33.5 g/dL (ref 31.5–35.7)
MCV: 92 fL (ref 79–97)
Monocytes Absolute: 0.4 10*3/uL (ref 0.1–0.9)
Monocytes: 6 %
Neutrophils Absolute: 3.9 10*3/uL (ref 1.4–7.0)
Neutrophils: 55 %
Platelets: 422 10*3/uL (ref 150–450)
RBC: 3.65 x10E6/uL — ABNORMAL LOW (ref 3.77–5.28)
RDW: 13.3 % (ref 11.7–15.4)
WBC: 7.2 10*3/uL (ref 3.4–10.8)

## 2020-10-07 LAB — COMPREHENSIVE METABOLIC PANEL
ALT: 23 IU/L (ref 0–32)
AST: 21 IU/L (ref 0–40)
Albumin/Globulin Ratio: 1.8 (ref 1.2–2.2)
Albumin: 4.7 g/dL (ref 3.8–4.8)
Alkaline Phosphatase: 115 IU/L (ref 44–121)
BUN/Creatinine Ratio: 12 (ref 12–28)
BUN: 15 mg/dL (ref 8–27)
Bilirubin Total: 0.3 mg/dL (ref 0.0–1.2)
CO2: 19 mmol/L — ABNORMAL LOW (ref 20–29)
Calcium: 10.1 mg/dL (ref 8.7–10.3)
Chloride: 101 mmol/L (ref 96–106)
Creatinine, Ser: 1.25 mg/dL — ABNORMAL HIGH (ref 0.57–1.00)
Globulin, Total: 2.6 g/dL (ref 1.5–4.5)
Glucose: 161 mg/dL — ABNORMAL HIGH (ref 65–99)
Potassium: 3.9 mmol/L (ref 3.5–5.2)
Sodium: 142 mmol/L (ref 134–144)
Total Protein: 7.3 g/dL (ref 6.0–8.5)
eGFR: 47 mL/min/{1.73_m2} — ABNORMAL LOW (ref >59)

## 2020-10-07 LAB — MICROALBUMIN / CREATININE URINE RATIO
Creatinine, Urine: 322.2 mg/dL
Microalb/Creat Ratio: 14 mg/g creat (ref 0–29)
Microalbumin, Urine: 44.9 ug/mL

## 2020-10-07 LAB — TSH: TSH: 1.09 u[IU]/mL (ref 0.450–4.500)

## 2020-10-08 ENCOUNTER — Encounter: Payer: Self-pay | Admitting: Adult Health

## 2020-10-08 ENCOUNTER — Ambulatory Visit (INDEPENDENT_AMBULATORY_CARE_PROVIDER_SITE_OTHER): Payer: Medicare HMO | Admitting: Adult Health

## 2020-10-08 ENCOUNTER — Other Ambulatory Visit: Payer: Self-pay

## 2020-10-08 VITALS — BP 172/80 | HR 80 | Ht 64.0 in | Wt 169.0 lb

## 2020-10-08 DIAGNOSIS — F0390 Unspecified dementia without behavioral disturbance: Secondary | ICD-10-CM

## 2020-10-08 MED ORDER — ATORVASTATIN CALCIUM 40 MG PO TABS: 1.0000 | ORAL_TABLET | Freq: Every day | ORAL | 1 refills | Status: DC

## 2020-10-08 NOTE — Patient Instructions (Signed)
Your Plan:  Continue Namenda 10 mg twice a day Memory score is stable If your symptoms worsen or you develop new symptoms please let us know.   Thank you for coming to see us at [facility]. I hope we have been able to provide you high quality care today.  You may receive a patient satisfaction survey over the next few weeks. We would appreciate your feedback and comments so that we may continue to improve ourselves and the health of our patients.  

## 2020-10-08 NOTE — Progress Notes (Addendum)
  PATIENT: Deborah Simon DOB: 07/24/1950  REASON FOR VISIT: follow up HISTORY FROM: patient  HISTORY OF PRESENT ILLNESS: Today 10/08/20:  Deborah Simon is a 69-year-old female with a history of memory disturbance.  She returns today to discuss her results from her neurocognitive evaluation.  I have reviewed these results with the patient.  Dr. Stewart have recommended further evaluation with LP and AD biomarkers.  The patient at this time does not want to proceed with any further work-up.  She remains on Namenda 10 mg twice a day.  She lives at home with her daughter in law and son.  She is able to complete all ADLs independently.  She continues to cook without difficulty.  She does not operate a motor vehicle.  Her daughter-in-law manages her medications and appointments.  She denies any trouble sleeping.  She returns today for an evaluation.  04/03/20: Deborah Simon is a 68-year-old female with a history of intermittent tremor the returns today for follow-up.  Her main concern today is memory disturbance.  She states that she has some issues with word finding.  She lives at home with her son.  She is able to complete all ADLs independently.  She manages her own finances without difficulty.  She manages her own medications and appointments.  She does use a pillbox and references a calendar.  She is able to shop independently for her needs but the granddaughter reports that she does need a list.  No change in mood or behavior.  Patient returns today for an evaluation.  HISTORY Deborah Simon is a 68-year-old right-handed woman with an underlying medical history of hypertension, hyperlipidemia, type 2 diabetes, and overweight state, who reports an intermittent right hand tremor for the past 4 weeks or so.  Symptoms came on fairly suddenly.  They are primarily related to using her right hand and arm.  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  She  has quite a bit of stress at work and is contemplating retiring.  She does better at home.  She gets tearful at times because of stress and trembling and it helps to calm her down by talking to her.  Her son has been helpful in that regard and has been able to calm her down.  Rubbing her hands on her thighs helps calm her down.  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  She does not drink caffeine on a day-to-day basis, she does not drink alcohol and is a non-smoker.  She tries to hydrate well, drinks about 2 bottles of 16 ounce water per day at work and also water at home.  She lives with one of her 2 sons who are grown.  She has not had any treatment for anxiety or stress. I reviewed your office note from 03/11/2019, which you kindly included.  She had blood work in your office on 03/11/2019 and I reviewed the results: Lipid panel showed benign findings with a total cholesterol of 139, HDL 60, triglycerides 54 and LDL 66.  CMP showed glucose mildly elevated at 120, BUN 19, creatinine 1.14, otherwise normal findings, CBC with differential showed WBC of 6.5, hemoglobin 11.4 which is mildly low and hematocrit 33.7 which   is also mildly low, platelets mildly elevated at 439, otherwise benign findings, A1c was mildly elevated at 6.6. She denies a family history of tremor or Parkinson's disease.  She has not fallen.  She has an eye examination once a year with Dr. Shapiro and is due for an appointment.  REVIEW OF SYSTEMS: Out of a complete 14 system review of symptoms, the patient complains only of the following symptoms, and all other reviewed systems are negative.  See HPI  ALLERGIES: Allergies  Allergen Reactions  . Omeprazole Other (See Comments)    headache  . Ace Inhibitors Swelling    Angioedema   .  Azithromycin Swelling  . Lisinopril Swelling  . Penicillins Other (See Comments)    Note  Sure rash of some sort probably, nothing airway related    HOME MEDICATIONS: Outpatient Medications Prior to Visit  Medication Sig Dispense Refill  . albuterol (VENTOLIN HFA) 108 (90 Base) MCG/ACT inhaler Inhale into the lungs.    . amLODipine (NORVASC) 5 MG tablet Take 5 mg by mouth daily.     . aspirin EC 81 MG tablet Take 81 mg by mouth daily.    . atorvastatin (LIPITOR) 40 MG tablet Take 1 tablet (40 mg total) by mouth daily. 90 tablet 1  . benzonatate (TESSALON) 200 MG capsule Take 1 capsule (200 mg total) by mouth 2 (two) times daily as needed for cough. 20 capsule 0  . Blood Glucose Monitoring Suppl (FREESTYLE FREEDOM LITE) W/DEVICE KIT 1 Units by Does not apply route once. Use to test blood glucose once a day 1 each 0  . cetirizine (ZYRTEC) 10 MG tablet Take 10 mg by mouth daily.    . citalopram (CELEXA) 20 MG tablet TAKE 1 TABLET BY MOUTH EVERY DAY 90 tablet 0  . docusate sodium (COLACE) 100 MG capsule Take 100 mg by mouth 2 (two) times daily as needed for mild constipation.     . etodolac (LODINE) 400 MG tablet TAKE 1 TABLET BY MOUTH TWICE A DAY 180 tablet 0  . ferrous sulfate 325 (65 FE) MG tablet Take 325 mg by mouth daily with breakfast.    . hydrochlorothiazide (HYDRODIURIL) 25 MG tablet TAKE 1 TABLET BY MOUTH EVERY DAY 30 tablet 5  . losartan (COZAAR) 50 MG tablet TAKE 1 TABLET BY MOUTH EVERY DAY 90 tablet 0  . memantine (NAMENDA) 10 MG tablet Take 1 tablet (10 mg total) by mouth 2 (two) times daily. 180 tablet 1  . metFORMIN (GLUCOPHAGE) 500 MG tablet TAKE 1 TABLET BY MOUTH 2 TIMES DAILY WITH A MEAL. 60 tablet 0  . metoprolol succinate (TOPROL-XL) 25 MG 24 hr tablet TAKE 1 TABLET BY MOUTH DAILY 90 tablet 0  . donepezil (ARICEPT) 10 MG tablet TAKE 1 TABLET BY MOUTH EVERYDAY AT BEDTIME 30 tablet 5   Facility-Administered Medications Prior to Visit  Medication Dose Route Frequency  Provider Last Rate Last Admin  . 0.9 %  sodium chloride infusion  500 mL Intravenous Once Danis, Henry L III, MD        PAST MEDICAL HISTORY: Past Medical History:  Diagnosis Date  . 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 Diabetes mellitus   . Fatty liver 07/12/2019  . Hyperlipidemia   . Hypertension     PAST SURGICAL HISTORY: Past Surgical History:  Procedure Laterality Date  . CHOLECYSTECTOMY  10/07/11   Single site  . COLONOSCOPY  03/27/2008   Dr.Patterson-normal    FAMILY HISTORY: Family History    Problem Relation Age of Onset  . Stroke Mother   . Stroke Father   . Aneurysm Son   . Colon cancer Neg Hx   . Colon polyps Neg Hx   . Esophageal cancer Neg Hx   . Stomach cancer Neg Hx   . Rectal cancer Neg Hx     SOCIAL HISTORY: Social History   Socioeconomic History  . Marital status: Single    Spouse name: Not on file  . Number of children: 3  . Years of education: Not on file  . Highest education level: Not on file  Occupational History  . Not on file  Tobacco Use  . Smoking status: Never Smoker  . Smokeless tobacco: Never Used  Vaping Use  . Vaping Use: Never used  Substance and Sexual Activity  . Alcohol use: No  . Drug use: No  . Sexual activity: Not Currently  Other Topics Concern  . Not on file  Social History Narrative  . Not on file   Social Determinants of Health   Financial Resource Strain: Not on file  Food Insecurity: Not on file  Transportation Needs: Not on file  Physical Activity: Not on file  Stress: Not on file  Social Connections: Not on file  Intimate Partner Violence: Not on file      PHYSICAL EXAM  Vitals:   10/08/20 1100  BP: (!) 172/80  Pulse: 80  Weight: 169 lb (76.7 kg)  Height: 5' 4" (1.626 m)   Body mass index is 29.01 kg/m.   MMSE - Mini Mental State Exam 10/08/2020 04/03/2020 09/28/2019  Orientation to time _0 Orientation to Place _1 Registration _2 Attention/  Calculation 0 1 4  Recall 1 0 0  Language- name 2 objects _3 Language- repeat 0 1 1  Language- follow 3 step command _4 Language- read & follow direction _5 Write a sentence _6 Copy design 1 0 0  Copy design-comments - - 5 animals  Total score _7 Generalized: Well developed, in no acute distress   Neurological examination  Mentation: Alert oriented to time, place, history taking. Follows all commands speech and language fluent Cranial nerve II-XII: Pupils were equal round reactive to light. Extraocular movements were full, visual field were full on confrontational test.  Head turning and shoulder shrug  were normal and symmetric. Motor: The motor testing reveals 5 over 5 strength of all 4 extremities. Good symmetric motor tone is noted throughout.  Sensory: Sensory testing is intact to soft touch on all 4 extremities. No evidence of extinction is noted.  Coordination: Cerebellar testing reveals good finger-nose-finger and heel-to-shin bilaterally. Mild tremor in the upper extremities with finger-nose-finger Gait and station: Gait is normal.  Reflexes: Deep tendon reflexes are symmetric and normal bilaterally.   DIAGNOSTIC DATA (LABS, IMAGING, TESTING) - I reviewed patient records, labs, notes, testing and imaging myself where available.  Lab Results  Component Value Date   WBC 7.2 10/04/2020   HGB 11.2 10/04/2020   HCT 33.4 (L) 10/04/2020   MCV 92 10/04/2020   PLT 422 10/04/2020      Component Value Date/Time   NA 142 10/04/2020 1158   K 3.9 10/04/2020 1158   CL 101 10/04/2020 1158   CO2 19 (L) 10/04/2020 1158   GLUCOSE 161 (H) 10/04/2020 1158   GLUCOSE 131 (H) 10/05/2019 0315  BUN 15 10/04/2020 1158   CREATININE 1.25 (H) 10/04/2020 1158   CREATININE 1.00 (H) 11/16/2015 1008   CALCIUM 10.1 10/04/2020 1158   PROT 7.3 10/04/2020 1158   ALBUMIN 4.7 10/04/2020 1158   AST 21 10/04/2020 1158   ALT 23 10/04/2020 1158   ALKPHOS 115 10/04/2020  1158   BILITOT 0.3 10/04/2020 1158   GFRNONAA 52 (L) 01/25/2020 1456   GFRNONAA 60 11/16/2015 1008   GFRAA 60 01/25/2020 1456   GFRAA 69 11/16/2015 1008   Lab Results  Component Value Date   CHOL 123 10/04/2020   HDL 47 10/04/2020   LDLCALC 62 10/04/2020   LDLDIRECT 131 (H) 09/01/2011   TRIG 67 10/04/2020   CHOLHDL 2.6 10/04/2020   Lab Results  Component Value Date   HGBA1C 6.6 (A) 10/04/2020   Lab Results  Component Value Date   VITAMINB12 680 09/28/2019   Lab Results  Component Value Date   TSH 1.090 10/04/2020      ASSESSMENT AND PLAN 70 y.o. year old female  has a past medical history of Degenerative joint disease (DJD) of lumbar spine (11/01/2019), Diabetes mellitus, Fatty liver (07/12/2019), Hyperlipidemia, and Hypertension. here with:  1.  Memory disturbance  -MMSE 19/30 previously 20 out of 30 -Reviewed neuropsychological evaluation results with her.  She does not want to proceed with any further work-up -Continue Namenda 10 mg twice a day -Patient cannot tolerate Aricept due to headaches   Advised if symptoms worsen or she develops new symptoms she should let us know.   I spent 30 minutes of face-to-face and non-face-to-face time with patient.  This included previsit chart review and reviewing and discussing neuropsychological evaluation with the patient and her daughter in law.  Ward Givens, MSN, NP-C 10/08/2020, 11:16 AM [facility] [address] [place], [place] [address] [phone]   I reviewed the above note and documentation by the Nurse Practitioner and agree with the history, exam, assessment and plan as outlined above. I was available for consultation. Star Age, MD, PhD [facility])

## 2020-10-24 IMAGING — CR PELVIS - 1-2 VIEW
1 series · 1 of 1 positions shown · non-contrast
Comparison: None.

CLINICAL DATA: Pain

EXAM:
PELVIS - 1-2 VIEW

[t pelvis a.p.]
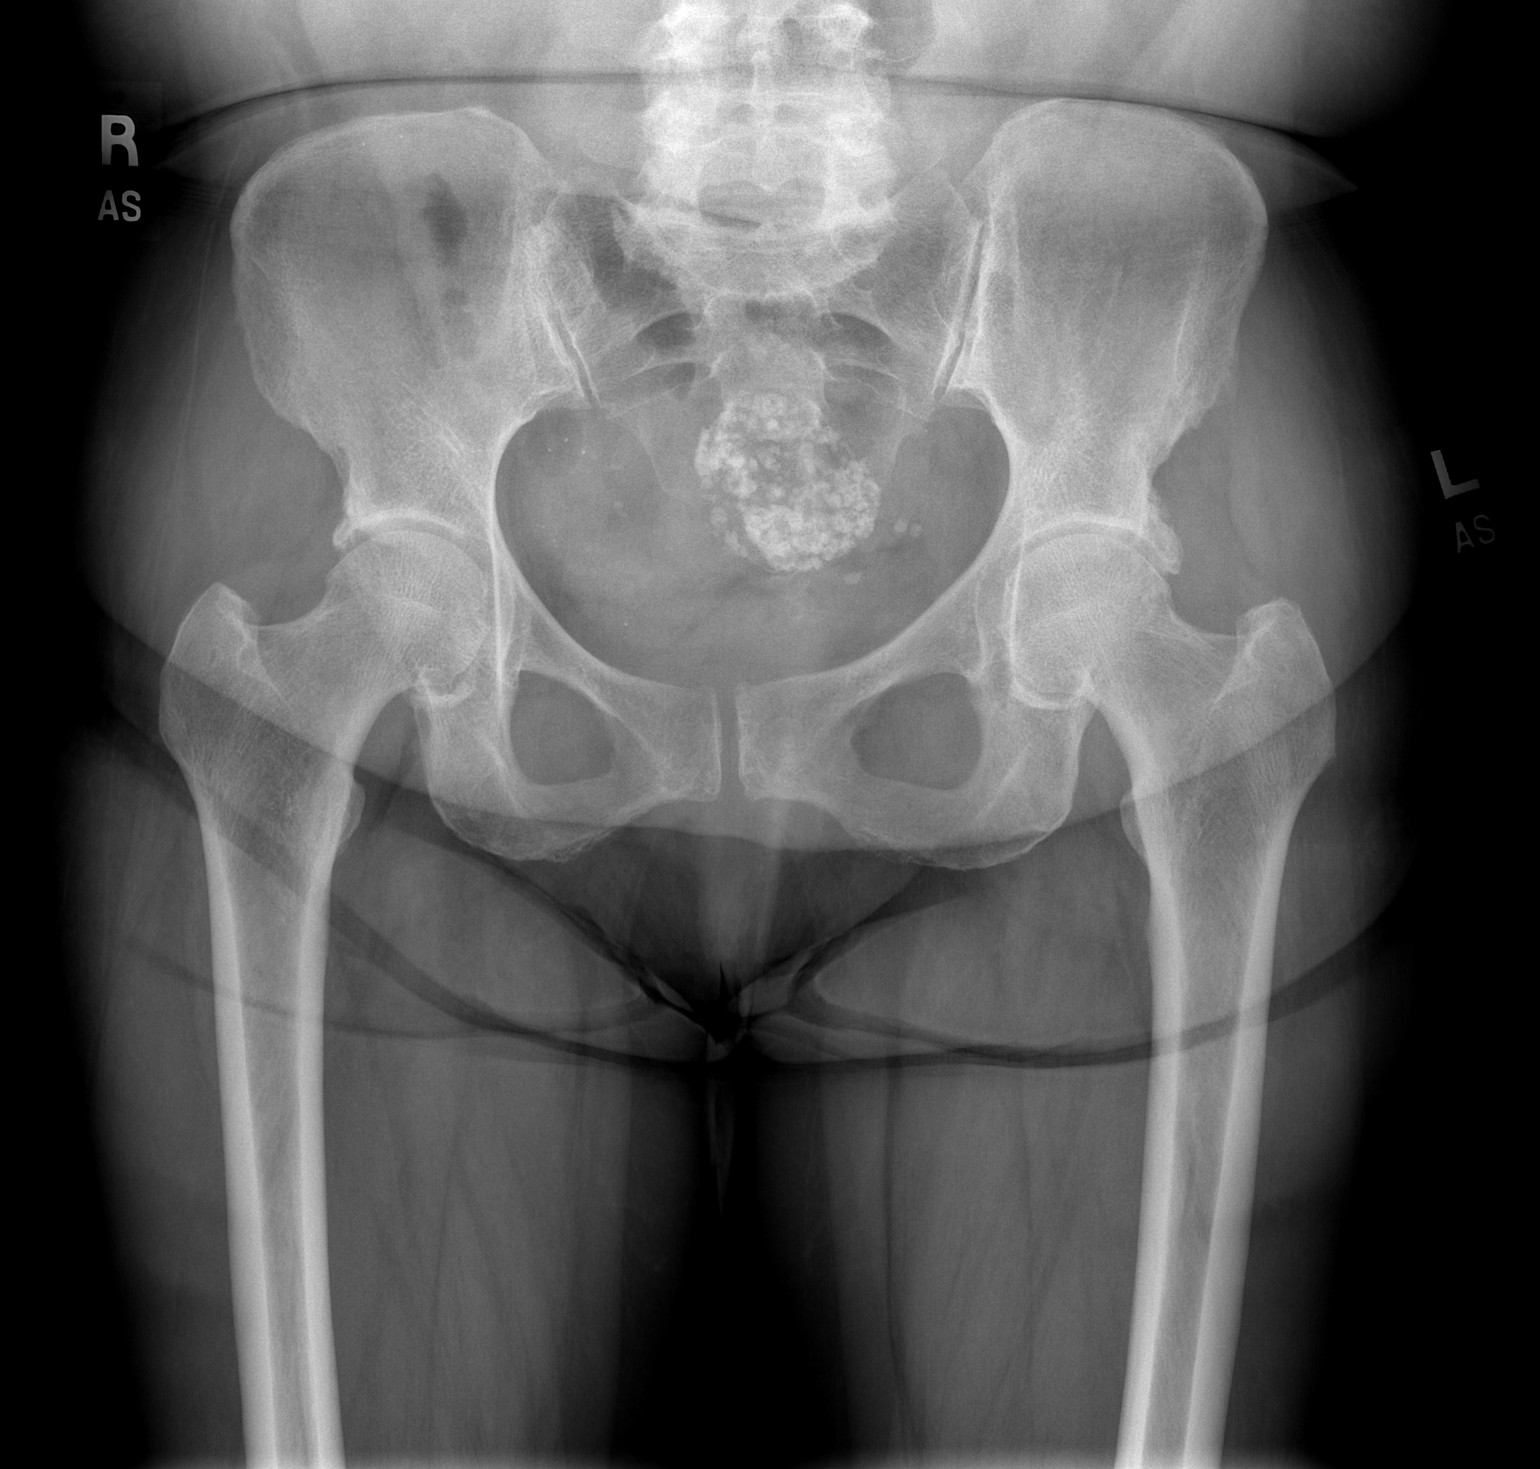

[1 of 1 positions shown; findings below may reference images not displayed]

FINDINGS: There is no acute displaced fracture or dislocation. There are mild
degenerative changes of both hips. There are fluffy calcifications
projecting over the patient's pelvis favored to represent fibroids
within the uterus. There are phleboliths that project over the
patient's pelvis.
IMPRESSION: 1. No acute osseous abnormality.
2. Mild degenerative changes of both hips.
3. Fibroid uterus.

## 2020-10-24 IMAGING — CR RIGHT FEMUR 2 VIEWS
4 series · 4 of 4 positions shown · non-contrast
Comparison: None.

CLINICAL DATA: Pain

EXAM:
RIGHT FEMUR 2 VIEWS

[t femur with hip  ap right]
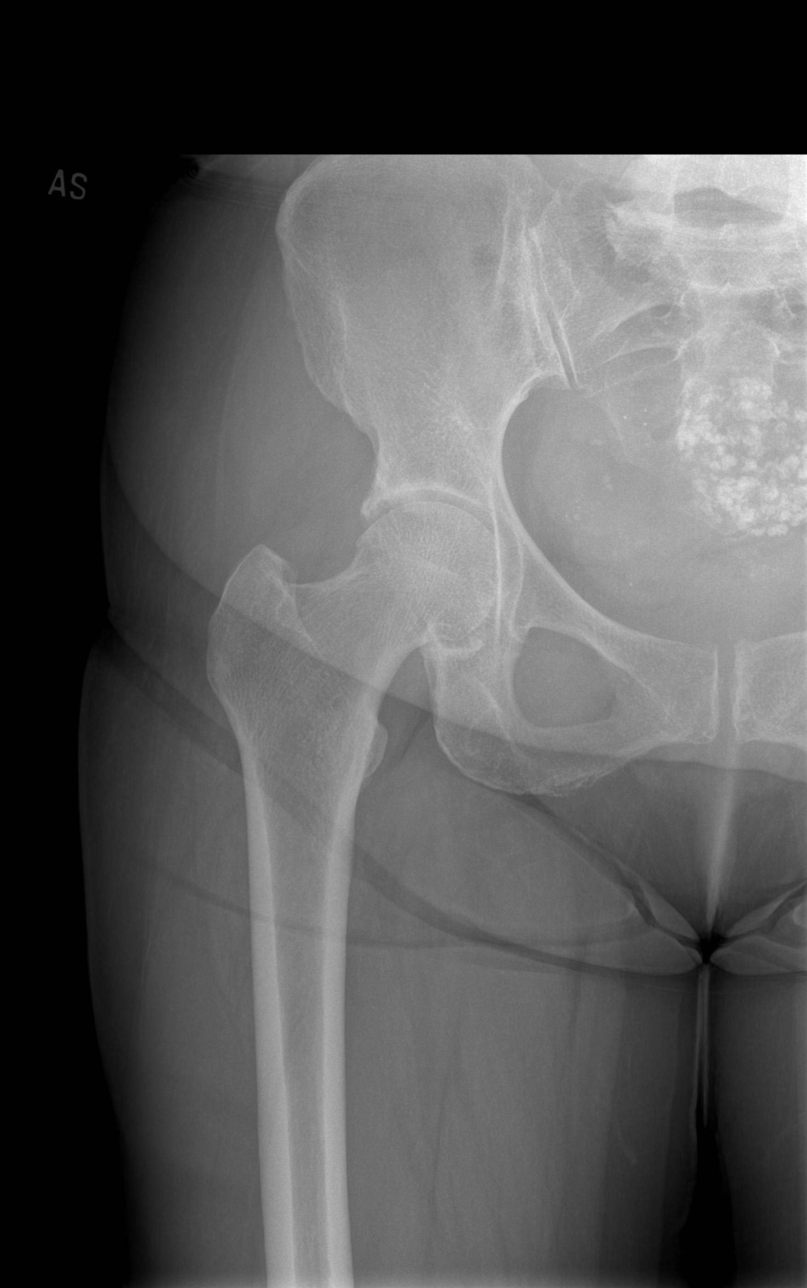

[t femur with knee ap right]
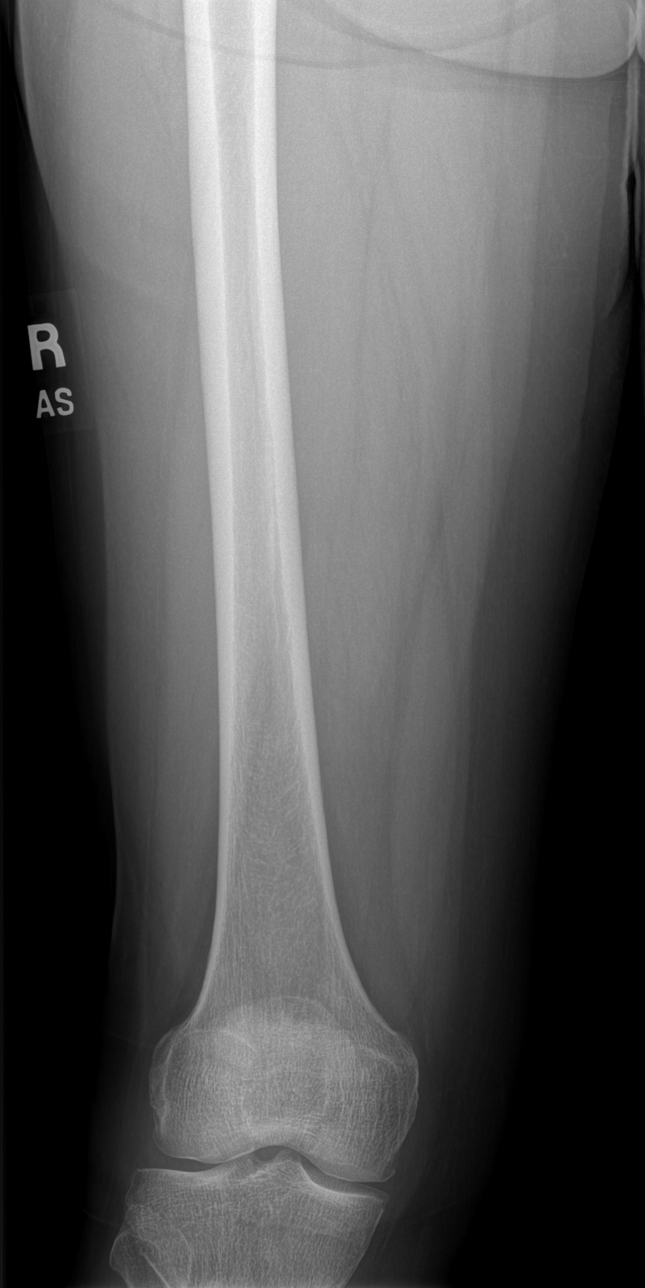

[t femur with hip lat right]
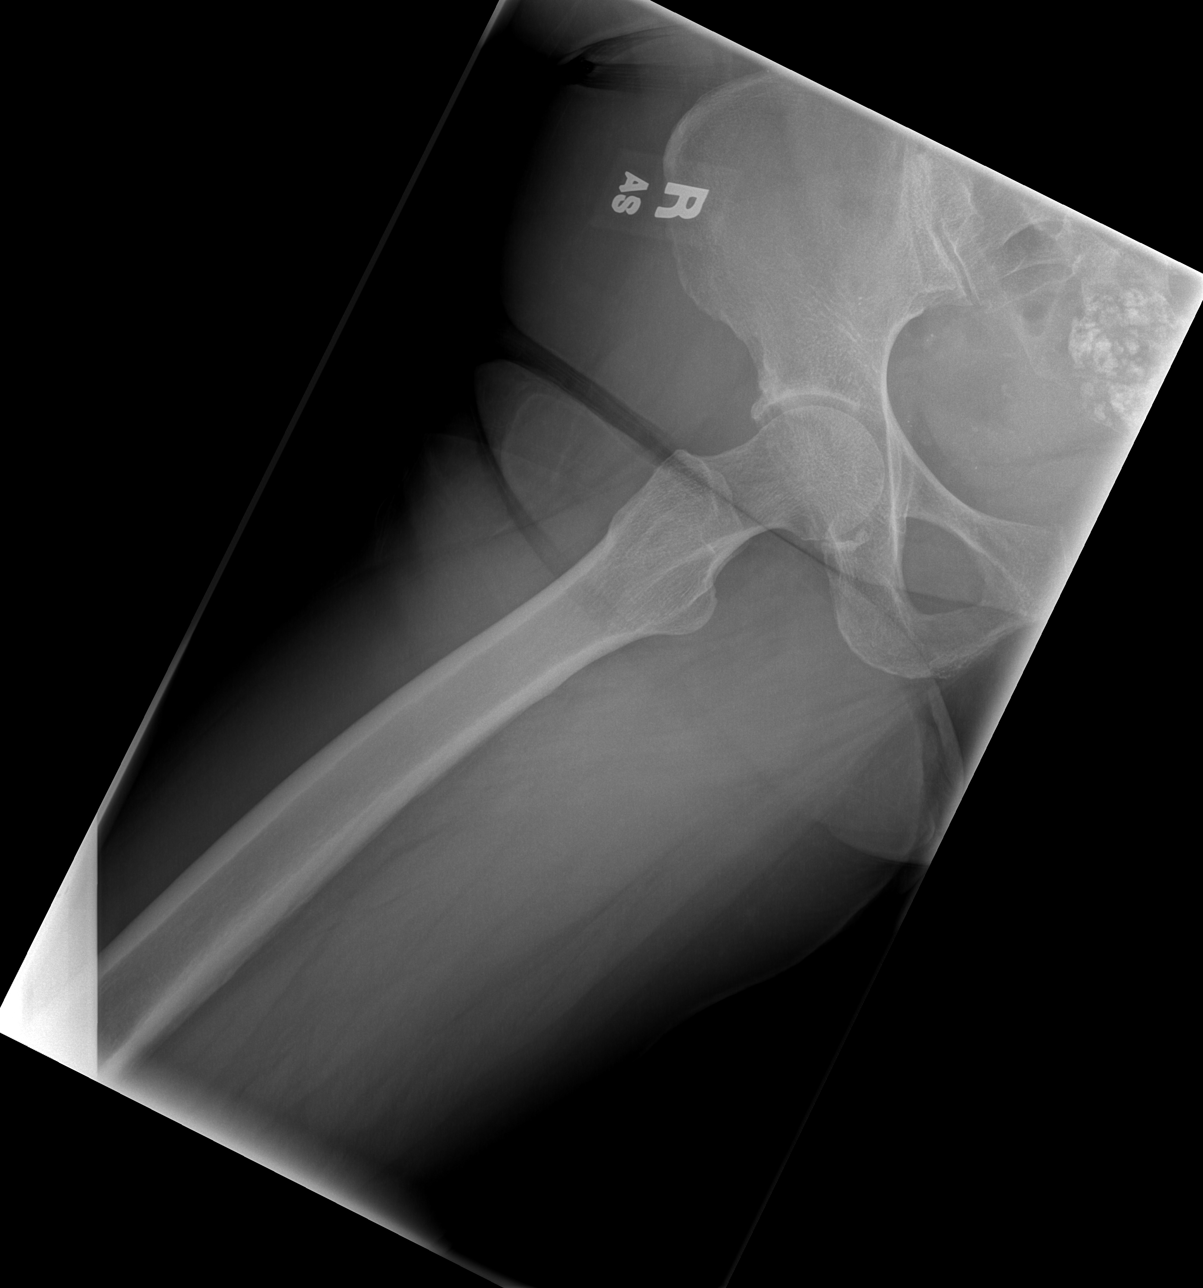

[t femur with knee lat right]
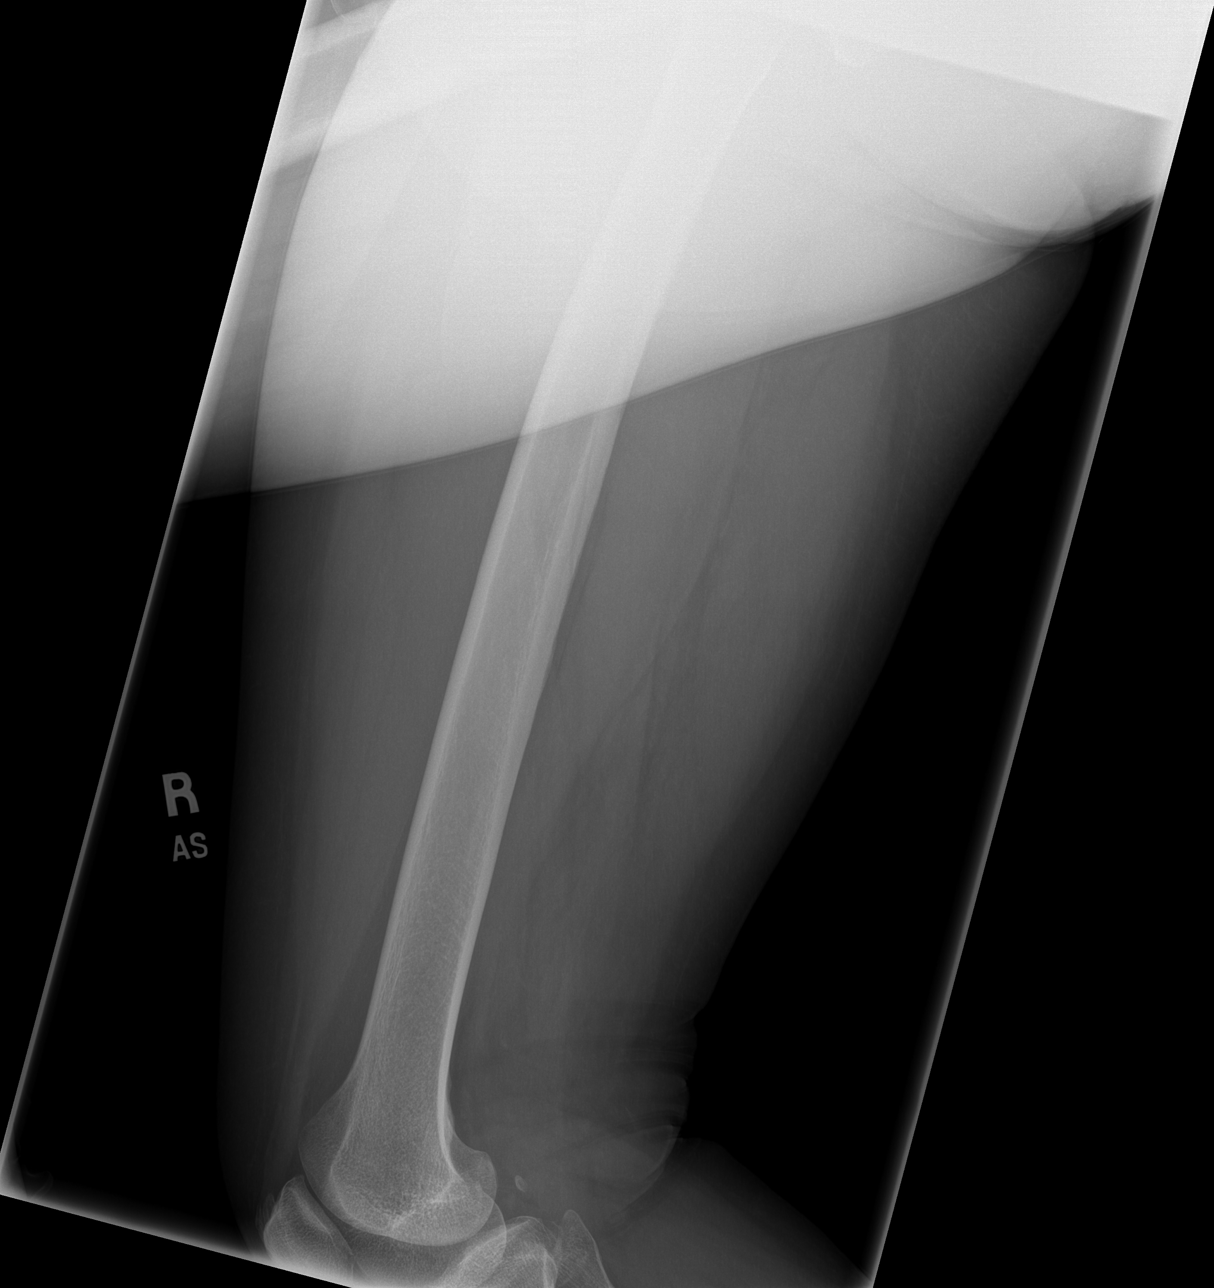

[4 of 4 positions shown; findings below may reference images not displayed]

FINDINGS: There is no acute displaced fracture or dislocation. Degenerative
changes are noted of the right hip. There is a fibroid uterus.
Phleboliths project over the patient's pelvis. There is no
unexpected radiopaque foreign body.
IMPRESSION: No acute displaced fracture or dislocation. Degenerative changes are
noted of the right hip.

## 2020-11-05 ENCOUNTER — Other Ambulatory Visit: Payer: Self-pay | Admitting: Family Medicine

## 2020-11-05 DIAGNOSIS — E119 Type 2 diabetes mellitus without complications: Secondary | ICD-10-CM

## 2020-11-06 ENCOUNTER — Other Ambulatory Visit: Payer: Self-pay | Admitting: Family Medicine

## 2020-11-06 DIAGNOSIS — Z1231 Encounter for screening mammogram for malignant neoplasm of breast: Secondary | ICD-10-CM

## 2020-11-06 DIAGNOSIS — E2839 Other primary ovarian failure: Secondary | ICD-10-CM

## 2020-12-08 ENCOUNTER — Other Ambulatory Visit: Payer: Self-pay | Admitting: Adult Health

## 2020-12-08 ENCOUNTER — Other Ambulatory Visit: Payer: Self-pay | Admitting: Family Medicine

## 2020-12-08 DIAGNOSIS — I10 Essential (primary) hypertension: Secondary | ICD-10-CM

## 2020-12-12 ENCOUNTER — Encounter: Payer: Self-pay | Admitting: Internal Medicine

## 2021-01-16 ENCOUNTER — Ambulatory Visit: Payer: Medicare HMO | Admitting: Family Medicine

## 2021-01-17 ENCOUNTER — Telehealth (INDEPENDENT_AMBULATORY_CARE_PROVIDER_SITE_OTHER): Payer: Medicare HMO | Admitting: Family Medicine

## 2021-01-17 ENCOUNTER — Other Ambulatory Visit: Payer: Self-pay

## 2021-01-17 ENCOUNTER — Encounter: Payer: Self-pay | Admitting: Family Medicine

## 2021-01-17 VITALS — Ht 64.0 in | Wt 165.0 lb

## 2021-01-17 DIAGNOSIS — Z8616 Personal history of COVID-19: Secondary | ICD-10-CM | POA: Diagnosis not present

## 2021-01-17 DIAGNOSIS — R059 Cough, unspecified: Secondary | ICD-10-CM

## 2021-01-17 MED ORDER — ALBUTEROL SULFATE HFA 108 (90 BASE) MCG/ACT IN AERS: 1.0000 | INHALATION_SPRAY | Freq: Four times a day (QID) | RESPIRATORY_TRACT | 0 refills | Status: DC | PRN

## 2021-01-17 MED ORDER — PREDNISONE 10 MG (21) PO TBPK: ORAL_TABLET | ORAL | 0 refills | Status: DC

## 2021-01-17 NOTE — Progress Notes (Signed)
Start time: 2:43 End time: 3:04  Virtual Visit via Video Note  I connected with Deborah Simon on 01/17/21 by a video enabled telemedicine application and verified that I am speaking with the correct person using two identifiers.  Location: Patient: at home, with her daughter-in-law Provider: office   I discussed the limitations of evaluation and management by telemedicine and the availability of in person appointments. The patient expressed understanding and agreed to proceed.  History of Present Illness:  Chief Complaint  Patient presents with   Cough    VIRTUAL still has cough from covid. Tested positive on July 7th. Lingering cough that comes and goes and it is all day long. Has tried NyQuil, DayQuil, Robitussin, Delsym and the leftover tessalon perles-these things only help temporarily but cough comes right back.    Patient is coughing most of the visit--daughter-in-law is providing the history.  She reports she has had issues with chronic dry cough x 1 year.  She has used tessalon for this chronic cough, still has a little left (2 pills left from April).  It helps calm it some. She previously has used inhalers, but none recently, doesn't think pt has had albuterol in a year or two.  The dry cough usually comes/goes, but has been more severe since having COVID in early July.  The cough is nonproductive. Spits some with coughing fits, just saliva, not discolored, pt pt/family.  She has spells of coughing, occurring about 3-5 times/day (and occurring during this visit).  Other than coughing spells, she is fine the rest of the time--denies shortness of breath, able to do normal activities.  PMH, PSH, SH reviewed DM, HTN, HLD, dementia Sugars have been good Lab Results  Component Value Date   HGBA1C 6.6 (A) 10/04/2020     Outpatient Encounter Medications as of 01/17/2021  Medication Sig Note   amLODipine (NORVASC) 5 MG tablet Take 5 mg by mouth daily.     aspirin EC 81 MG  tablet Take 81 mg by mouth daily.    atorvastatin (LIPITOR) 40 MG tablet Take 1 tablet (40 mg total) by mouth daily.    benzonatate (TESSALON) 200 MG capsule Take 1 capsule (200 mg total) by mouth 2 (two) times daily as needed for cough. 01/17/2021: Last dose yesterday afternoon 1pm   Blood Glucose Monitoring Suppl (FREESTYLE FREEDOM LITE) W/DEVICE KIT 1 Units by Does not apply route once. Use to test blood glucose once a day    cetirizine (ZYRTEC) 10 MG tablet Take 10 mg by mouth daily.    citalopram (CELEXA) 20 MG tablet TAKE 1 TABLET BY MOUTH EVERY DAY    Dextromethorphan HBr (DELSYM PO) Take 15 mLs by mouth as needed. 01/17/2021: Last dose 7:30pm last night   etodolac (LODINE) 400 MG tablet TAKE 1 TABLET BY MOUTH TWICE A DAY    guaifenesin (ROBITUSSIN) 100 MG/5ML syrup Take 200 mg by mouth 3 (three) times daily as needed for cough. 01/17/2021: Last dose last evening (not full dose) 5:30pm   hydrochlorothiazide (HYDRODIURIL) 25 MG tablet TAKE 1 TABLET BY MOUTH EVERY DAY    losartan (COZAAR) 50 MG tablet TAKE 1 TABLET BY MOUTH EVERY DAY    memantine (NAMENDA) 10 MG tablet TAKE 1 TABLET BY MOUTH TWICE A DAY    metFORMIN (GLUCOPHAGE) 500 MG tablet TAKE 1 TABLET BY MOUTH 2 TIMES DAILY WITH A MEAL.    metoprolol succinate (TOPROL-XL) 25 MG 24 hr tablet TAKE 1 TABLET BY MOUTH DAILY    albuterol (VENTOLIN   HFA) 108 (90 Base) MCG/ACT inhaler Inhale into the lungs. (Patient not taking: Reported on 01/17/2021)    docusate sodium (COLACE) 100 MG capsule Take 100 mg by mouth 2 (two) times daily as needed for mild constipation.  (Patient not taking: Reported on 01/17/2021)    ferrous sulfate 325 (65 FE) MG tablet Take 325 mg by mouth daily with breakfast. (Patient not taking: Reported on 01/17/2021)    Facility-Administered Encounter Medications as of 01/17/2021  Medication   0.9 %  sodium chloride infusion   Allergies  Allergen Reactions   Omeprazole Other (See Comments)    headache   Ace Inhibitors Swelling     Angioedema    Aricept [Donepezil]     headaches   Azithromycin Swelling   Lisinopril Swelling   Penicillins Other (See Comments)    Note  Sure rash of some sort probably, nothing airway related   ROS: no known fever, chills, headaches, dizziness, chest pain.  No n/v/d, rashes, bleeding, bruising.  +dry cough per HPI.  No swelling, shortness of breath.    Observations/Objective:  Ht 5' 4" (1.626 m)   Wt 165 lb (74.8 kg)   LMP  (LMP Unknown)   BMI 28.32 kg/m   Coughing nonstop, hacky cough, during visit. At one point, she took sip of water, calmed down.  She spoke a few words, triggered a little more coughing. Exam is limited due to virtual nature of the visit.   Assessment and Plan:  Cough - chronic, worse since COVID. Nothing to suggest bacterial infection. suspect post-viral cough vs RAD. Treat with steroids, risks/SE reviewed. CXR given risks - Plan: DG Chest 2 View, predniSONE (STERAPRED UNI-PAK 21 TAB) 10 MG (21) TBPK tablet, albuterol (VENTOLIN HFA) 108 (90 Base) MCG/ACT inhaler  History of COVID-19 - doesn't appear to have complicated dx, but given severity of her cough, will check CXR - Plan: DG Chest 2 View  Go to [facility] ([address]) to get chest x-ray.  Take Mucinex DM 12 hour tablets twice daily STOP ROBITUSSIN AND STOP DELSYM  Take the steroid course as directed.  We are prescribing this, as I don't think she will be able to use the inhaler during a coughing spell, and this likely will help calm down her cough. Use albuterol inhaler if needed for wheezing or shortness of breath.  You should follow-up next week of you aren't any better, or if new symptoms develop.   Follow Up Instructions:    I discussed the assessment and treatment plan with the patient. The patient was provided an opportunity to ask questions and all were answered. The patient agreed with the plan and demonstrated an understanding of the instructions.   The  patient was advised to call back or seek an in-person evaluation if the symptoms worsen or if the condition fails to improve as anticipated.  I spent 23 minutes dedicated to the care of this patient, including pre-visit review of records, face to face time, post-visit ordering of testing and documentation.     A , MD  

## 2021-01-17 NOTE — Patient Instructions (Signed)
Go to [facility] ([address]) to get chest x-ray.  Take Mucinex DM 12 hour tablets twice daily STOP ROBITUSSIN AND STOP DELSYM  Take the steroid course as directed.  We are prescribing this, as I don't think she will be able to use the inhaler during a coughing spell, and this likely will help calm down her cough. Use albuterol inhaler if needed for wheezing or shortness of breath.  You should follow-up next week of you aren't any better, or if new symptoms develop.

## 2021-01-23 ENCOUNTER — Encounter: Payer: Self-pay | Admitting: Internal Medicine

## 2021-02-03 ENCOUNTER — Other Ambulatory Visit: Payer: Self-pay | Admitting: Family Medicine

## 2021-02-03 DIAGNOSIS — E119 Type 2 diabetes mellitus without complications: Secondary | ICD-10-CM

## 2021-02-08 ENCOUNTER — Other Ambulatory Visit: Payer: Self-pay | Admitting: Family Medicine

## 2021-02-08 DIAGNOSIS — R059 Cough, unspecified: Secondary | ICD-10-CM

## 2021-02-08 NOTE — Telephone Encounter (Signed)
Received request for refill on the pts. Albuterol inhaler last apt was 01/17/21.

## 2021-03-02 ENCOUNTER — Telehealth: Payer: Self-pay

## 2021-03-02 NOTE — Telephone Encounter (Signed)
Attempted to reach patient. Both numbers on file are not in service. Maryjean Morn, CMA

## 2021-03-09 ENCOUNTER — Other Ambulatory Visit: Payer: Self-pay | Admitting: Family Medicine

## 2021-03-09 DIAGNOSIS — I10 Essential (primary) hypertension: Secondary | ICD-10-CM

## 2021-03-10 ENCOUNTER — Other Ambulatory Visit: Payer: Self-pay | Admitting: Family Medicine

## 2021-04-11 ENCOUNTER — Ambulatory Visit: Payer: Medicare HMO | Admitting: Adult Health

## 2021-04-15 ENCOUNTER — Other Ambulatory Visit: Payer: Self-pay

## 2021-04-15 DIAGNOSIS — I10 Essential (primary) hypertension: Secondary | ICD-10-CM

## 2021-04-15 MED ORDER — HYDROCHLOROTHIAZIDE 25 MG PO TABS: 25.0000 mg | ORAL_TABLET | Freq: Every day | ORAL | 1 refills | Status: DC

## 2021-04-22 ENCOUNTER — Inpatient Hospital Stay: Admission: RE | Admit: 2021-04-22 | Payer: Medicare HMO | Source: Ambulatory Visit

## 2021-04-22 ENCOUNTER — Other Ambulatory Visit: Payer: Self-pay | Admitting: Family Medicine

## 2021-04-22 ENCOUNTER — Ambulatory Visit: Payer: Medicare HMO

## 2021-04-22 DIAGNOSIS — Z1231 Encounter for screening mammogram for malignant neoplasm of breast: Secondary | ICD-10-CM

## 2021-05-21 ENCOUNTER — Other Ambulatory Visit: Payer: Self-pay | Admitting: Medical

## 2021-05-21 DIAGNOSIS — I10 Essential (primary) hypertension: Secondary | ICD-10-CM

## 2021-05-24 ENCOUNTER — Ambulatory Visit (INDEPENDENT_AMBULATORY_CARE_PROVIDER_SITE_OTHER): Payer: Medicare HMO

## 2021-05-24 VITALS — Ht 60.0 in | Wt 150.0 lb

## 2021-05-24 DIAGNOSIS — Z0000 Encounter for general adult medical examination without abnormal findings: Secondary | ICD-10-CM

## 2021-05-24 NOTE — Patient Instructions (Signed)
[name] , Thank you for taking time to come for your Medicare Wellness Visit. I appreciate your ongoing commitment to your health goals. Please review the following plan we discussed and let me know if I can assist you in the future.   Screening recommendations/referrals: Colonoscopy: completed 07/09/2018 Mammogram: family to schedule Bone Density: family to schedule Recommended yearly ophthalmology/optometry visit for glaucoma screening and checkup Recommended yearly dental visit for hygiene and checkup  Vaccinations: Influenza vaccine: completed 04/29/2021 Pneumococcal vaccine: completed 10/04/2020 Tdap vaccine: due Shingles vaccine: completed   Covid-19: 04/29/2021, 06/27/2019, 09/26/2019, 08/26/2019  Advanced directives: Advance directive discussed with you today.   Conditions/risks identified: none  Next appointment: Follow up in one year for your annual wellness visit    Preventive Care 65 Years and Older, Female Preventive care refers to lifestyle choices and visits with your health care provider that can promote health and wellness. What does preventive care include? A yearly physical exam. This is also called an annual well check. Dental exams once or twice a year. Routine eye exams. Ask your health care provider how often you should have your eyes checked. Personal lifestyle choices, including: Daily care of your teeth and gums. Regular physical activity. Eating a healthy diet. Avoiding tobacco and drug use. Limiting alcohol use. Practicing safe sex. Taking low-dose aspirin every day. Taking vitamin and mineral supplements as recommended by your health care provider. What happens during an annual well check? The services and screenings done by your health care provider during your annual well check will depend on your age, overall health, lifestyle risk factors, and family history of disease. Counseling  Your health care provider may ask you questions about  your: Alcohol use. Tobacco use. Drug use. Emotional well-being. Home and relationship well-being. Sexual activity. Eating habits. History of falls. Memory and ability to understand (cognition). Work and work Astronomer. Reproductive health. Screening  You may have the following tests or measurements: Height, weight, and BMI. Blood pressure. Lipid and cholesterol levels. These may be checked every 5 years, or more frequently if you are over 81 years old. Skin check. Lung cancer screening. You may have this screening every year starting at age 48 if you have a 30-pack-year history of smoking and currently smoke or have quit within the past 15 years. Fecal occult blood test (FOBT) of the stool. You may have this test every year starting at age 57. Flexible sigmoidoscopy or colonoscopy. You may have a sigmoidoscopy every 5 years or a colonoscopy every 10 years starting at age 16. Hepatitis C blood test. Hepatitis B blood test. Sexually transmitted disease (STD) testing. Diabetes screening. This is done by checking your blood sugar (glucose) after you have not eaten for a while (fasting). You may have this done every 1-3 years. Bone density scan. This is done to screen for osteoporosis. You may have this done starting at age 25. Mammogram. This may be done every 1-2 years. Talk to your health care provider about how often you should have regular mammograms. Talk with your health care provider about your test results, treatment options, and if necessary, the need for more tests. Vaccines  Your health care provider may recommend certain vaccines, such as: Influenza vaccine. This is recommended every year. Tetanus, diphtheria, and acellular pertussis (Tdap, Td) vaccine. You may need a Td booster every 10 years. Zoster vaccine. You may need this after age 27. Pneumococcal 13-valent conjugate (PCV13) vaccine. One dose is recommended after age 54. Pneumococcal polysaccharide (PPSV23) vaccine.  One  dose is recommended after age 60. Talk to your health care provider about which screenings and vaccines you need and how often you need them. This information is not intended to replace advice given to you by your health care provider. Make sure you discuss any questions you have with your health care provider. Document Released: 06/29/2015 Document Revised: 02/20/2016 Document Reviewed: 04/03/2015 Elsevier Interactive Patient Education  2017 [place] Prevention in the Home Falls can cause injuries. They can happen to people of all ages. There are many things you can do to make your home safe and to help prevent falls. What can I do on the outside of my home? Regularly fix the edges of walkways and driveways and fix any cracks. Remove anything that might make you trip as you walk through a door, such as a raised step or threshold. Trim any bushes or trees on the path to your home. Use bright outdoor lighting. Clear any walking paths of anything that might make someone trip, such as rocks or tools. Regularly check to see if handrails are loose or broken. Make sure that both sides of any steps have handrails. Any raised decks and porches should have guardrails on the edges. Have any leaves, snow, or ice cleared regularly. Use sand or salt on walking paths during winter. Clean up any spills in your garage right away. This includes oil or grease spills. What can I do in the bathroom? Use night lights. Install grab bars by the toilet and in the tub and shower. Do not use towel bars as grab bars. Use non-skid mats or decals in the tub or shower. If you need to sit down in the shower, use a plastic, non-slip stool. Keep the floor dry. Clean up any water that spills on the floor as soon as it happens. Remove soap buildup in the tub or shower regularly. Attach bath mats securely with double-sided non-slip rug tape. Do not have throw rugs and other things on the floor that can make  you trip. What can I do in the bedroom? Use night lights. Make sure that you have a light by your bed that is easy to reach. Do not use any sheets or blankets that are too big for your bed. They should not hang down onto the floor. Have a firm chair that has side arms. You can use this for support while you get dressed. Do not have throw rugs and other things on the floor that can make you trip. What can I do in the kitchen? Clean up any spills right away. Avoid walking on wet floors. Keep items that you use a lot in easy-to-reach places. If you need to reach something above you, use a strong step stool that has a grab bar. Keep electrical cords out of the way. Do not use floor polish or wax that makes floors slippery. If you must use wax, use non-skid floor wax. Do not have throw rugs and other things on the floor that can make you trip. What can I do with my stairs? Do not leave any items on the stairs. Make sure that there are handrails on both sides of the stairs and use them. Fix handrails that are broken or loose. Make sure that handrails are as long as the stairways. Check any carpeting to make sure that it is firmly attached to the stairs. Fix any carpet that is loose or worn. Avoid having throw rugs at the top or bottom of the stairs.  If you do have throw rugs, attach them to the floor with carpet tape. Make sure that you have a light switch at the top of the stairs and the bottom of the stairs. If you do not have them, ask someone to add them for you. What else can I do to help prevent falls? Wear shoes that: Do not have high heels. Have rubber bottoms. Are comfortable and fit you well. Are closed at the toe. Do not wear sandals. If you use a stepladder: Make sure that it is fully opened. Do not climb a closed stepladder. Make sure that both sides of the stepladder are locked into place. Ask someone to hold it for you, if possible. Clearly mark and make sure that you can  see: Any grab bars or handrails. First and last steps. Where the edge of each step is. Use tools that help you move around (mobility aids) if they are needed. These include: Canes. Walkers. Scooters. Crutches. Turn on the lights when you go into a dark area. Replace any light bulbs as soon as they burn out. Set up your furniture so you have a clear path. Avoid moving your furniture around. If any of your floors are uneven, fix them. If there are any pets around you, be aware of where they are. Review your medicines with your doctor. Some medicines can make you feel dizzy. This can increase your chance of falling. Ask your doctor what other things that you can do to help prevent falls. This information is not intended to replace advice given to you by your health care provider. Make sure you discuss any questions you have with your health care provider. Document Released: 03/29/2009 Document Revised: 11/08/2015 Document Reviewed: 07/07/2014 Elsevier Interactive Patient Education  2017 [employer].

## 2021-05-24 NOTE — Progress Notes (Signed)
I connected with  Epimenio Sarin today via telehealth video enabled device and verified that I am speaking with the correct person using two identifiers.   Location: Patient: home Provider: work  Persons participating in virtual visit: Kaytlin Burklow, Ivin Booty (daughter in law), Glenna Durand LPN  I discussed the limitations, risks, security and privacy concerns of performing an evaluation and management service by video and the availability of in person appointments. The patient expressed understanding and agreed to proceed.   Some vital signs may be absent or patient reported.     Subjective:   Deborah Simon is a 70 y.o. female who presents for an Initial Medicare Annual Wellness Visit.  Review of Systems     Cardiac Risk Factors include: advanced age (>13mn, >>33women);diabetes mellitus;dyslipidemia;hypertension     Objective:    Today's Vitals   05/24/21 1542  Weight: 150 lb (68 kg)  Height: 5' (1.524 m)   Body mass index is 29.29 kg/m.  Advanced Directives 05/24/2021 10/05/2019 08/03/2018 02/01/2016 11/16/2015 04/17/2015 09/27/2014  Does Patient Have a Medical Advance Directive? _0  No No  Would patient like information on creating a medical advance directive? - - - No - patient declined information No - patient declined information No - patient declined information No - patient declined information    Current Medications (verified) Outpatient Encounter Medications as of 05/24/2021  Medication Sig   albuterol (VENTOLIN HFA) 108 (90 Base) MCG/ACT inhaler INHALE 1-2 PUFFS BY MOUTH EVERY 6 HOURS AS NEEDED FOR WHEEZE OR SHORTNESS OF BREATH   amLODipine (NORVASC) 5 MG tablet Take 5 mg by mouth daily.    aspirin EC 81 MG tablet Take 81 mg by mouth daily.   atorvastatin (LIPITOR) 40 MG tablet Take 1 tablet (40 mg total) by mouth daily.   Blood Glucose Monitoring Suppl (FREESTYLE FREEDOM LITE) W/DEVICE KIT 1 Units by Does not apply route once. Use to test blood glucose  once a day   cetirizine (ZYRTEC) 10 MG tablet Take 10 mg by mouth daily.   citalopram (CELEXA) 20 MG tablet TAKE 1 TABLET BY MOUTH EVERY DAY   guaifenesin (ROBITUSSIN) 100 MG/5ML syrup Take 200 mg by mouth 3 (three) times daily as needed for cough.   hydrochlorothiazide (HYDRODIURIL) 25 MG tablet TAKE 1 TABLET (25 MG TOTAL) BY MOUTH DAILY.   losartan (COZAAR) 50 MG tablet TAKE 1 TABLET BY MOUTH EVERY DAY   memantine (NAMENDA) 10 MG tablet TAKE 1 TABLET BY MOUTH TWICE A DAY   metFORMIN (GLUCOPHAGE) 500 MG tablet TAKE 1 TABLET BY MOUTH 2 TIMES DAILY WITH A MEAL.   metoprolol succinate (TOPROL-XL) 25 MG 24 hr tablet TAKE 1 TABLET BY MOUTH DAILY   benzonatate (TESSALON) 200 MG capsule Take 1 capsule (200 mg total) by mouth 2 (two) times daily as needed for cough. (Patient not taking: Reported on 05/24/2021)   Dextromethorphan HBr (DELSYM PO) Take 15 mLs by mouth as needed. (Patient not taking: Reported on 05/24/2021)   docusate sodium (COLACE) 100 MG capsule Take 100 mg by mouth 2 (two) times daily as needed for mild constipation.  (Patient not taking: Reported on 01/17/2021)   etodolac (LODINE) 400 MG tablet TAKE 1 TABLET BY MOUTH TWICE A DAY   ferrous sulfate 325 (65 FE) MG tablet Take 325 mg by mouth daily with breakfast. 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   Diabetes mellitus    Fatty liver 07/12/2019   Hyperlipidemia    Hypertension    Past Surgical History:  Procedure Laterality Date   CHOLECYSTECTOMY  10/07/11   Single site    COLONOSCOPY  03/27/2008   Dr.Patterson-normal   Family History  Problem Relation Age of Onset   Stroke Mother    Stroke Father    Aneurysm Son    Colon cancer Neg Hx    Colon polyps Neg Hx    Esophageal cancer Neg Hx    Stomach cancer Neg Hx    Rectal cancer Neg Hx    Social History   Socioeconomic History   Marital status: Single    Spouse name: Not on file   Number of children: 3   Years of education: Not on file   Highest education level: Not on file  Occupational History   Not on file  Tobacco Use   Smoking status: Never   Smokeless tobacco: Never  Vaping Use   Vaping Use: Never used  Substance and Sexual Activity   Alcohol use: No   Drug use: No   Sexual activity: Not Currently  Other Topics Concern   Not on file  Social History Narrative   Not on file   Social Determinants of Health   Financial Resource Strain: Low Risk    Difficulty of Paying Living Expenses: Not hard at all  Food Insecurity: No Food Insecurity   Worried About Charity fundraiser in the Last Year: Never true   Arboriculturist in the Last Year: Never true  Transportation Needs: No Transportation Needs   Lack of Transportation (Medical): No   Lack of Transportation (Non-Medical): No  Physical Activity: Inactive   Days of Exercise per Week: 0 days   Minutes of Exercise per Session: 0 min  Stress: No Stress Concern Present   Feeling of Stress : Not at all  Social Connections: Not on file    Tobacco Counseling Counseling given: Not Answered   Clinical Intake:  Pre-visit preparation completed: Yes  Pain : No/denies pain     Nutritional Status: BMI 25 -29 Overweight Nutritional Risks: None Diabetes: Yes  How often do you need to have someone help you when you read instructions, pamphlets, or other written materials from your doctor or pharmacy?: 1 - Never What is the last grade level you completed in school?: 12th grade  Diabetic? Yes Nutrition Risk Assessment:  Has the  patient had any N/V/D within the last 2 months?  No  Does the patient have any non-healing wounds?  No  Has the patient had any unintentional weight loss or weight gain?  No   Diabetes:  Is the patient diabetic?  Yes  If diabetic, was a CBG obtained today?  No  Did the patient bring in their glucometer from home?  No  How often do you monitor your CBG's? Does not.   Financial Strains and Diabetes Management:  Are you having any financial strains with the device, your supplies or your medication? No .  Does the patient want to be seen by Chronic Care Management for management of their diabetes?  No  Would the patient like to  be referred to a Nutritionist or for Diabetic Management?  No   Diabetic Exams:  Diabetic Eye Exam: Overdue for diabetic eye exam. Pt has been advised about the importance in completing this exam. Patient advised to call and schedule an eye exam. Diabetic Foot Exam: Completed 10/04/2020   Interpreter Needed?: No  Information entered by :: NAllen LPN   Activities of Daily Living In your present state of health, do you have any difficulty performing the following activities: 05/24/2021  Hearing? N  Vision? N  Difficulty concentrating or making decisions? Y  Walking or climbing stairs? N  Dressing or bathing? N  Doing errands, shopping? N  Preparing Food and eating ? N  Using the Toilet? N  In the past six months, have you accidently leaked urine? N  Do you have problems with loss of bowel control? N  Managing your Medications? Y  Managing your Finances? Y  Housekeeping or managing your Housekeeping? N  Some recent data might be hidden    Patient Care Team: Davy Pique as PCP - General (Family Medicine)  Indicate any recent Medical Services you may have received from other than [facility] providers in the past year (date may be approximate).     Assessment:   This is a routine wellness examination for [place].  Hearing/Vision screen Vision  Screening - Comments:: No regular eye exams, [facility]  Dietary issues and exercise activities discussed: Current Exercise Habits: The patient does not participate in regular exercise at present   Goals Addressed             This Visit's Progress    Patient Stated       05/24/2021, no goals       Depression Screen PHQ 2/9 Scores 05/24/2021 10/04/2020 02/01/2016 11/16/2015 04/17/2015 09/27/2014 08/31/2014  PHQ - 2 Score 0 0 0 0 0 0 0    Fall Risk Fall Risk  05/24/2021 10/04/2020 03/31/2019 01/14/2018 02/01/2016  Falls in the past year? 0 0 0 No No  Comment - - - Emmi Telephone Survey: data to providers prior to load -  Number falls in past yr: - 0 0 - -  Injury with Fall? - 0 0 - -  Risk for fall due to : Medication side effect No Fall Risks Medication side effect - -  Follow up Falls evaluation completed;Education provided;Falls prevention discussed Falls evaluation completed Falls evaluation completed - -    FALL RISK PREVENTION PERTAINING TO THE HOME:  Any stairs in or around the home? Yes  If so, are there any without handrails? No  Home free of loose throw rugs in walkways, pet beds, electrical cords, etc? Yes  Adequate lighting in your home to reduce risk of falls? Yes   ASSISTIVE DEVICES UTILIZED TO PREVENT FALLS:  Life alert? No  Use of a cane, walker or w/c? No  Grab bars in the bathroom? No  Shower chair or bench in shower? No  Elevated toilet seat or a handicapped toilet? No   TIMED UP AND GO:  Was the test performed? No .      Cognitive Function: MMSE - Mini Mental State Exam 10/08/2020 04/03/2020 09/28/2019  Orientation to time _0 Orientation to Place _1 Registration _2 Attention/ Calculation 0 1 4  Recall 1 0 0  Language- name 2 objects _3 Language- repeat 0 1 1  Language- follow 3 step command _4 Language-  read & follow direction _0 Write a sentence _1 Copy design 1 0 0  Copy design-comments - - 5 animals  Total score _2 Immunizations Immunization History  Administered Date(s) Administered   Fluad Quad(high Dose 65+) 04/29/2021   Influenza Split 05/12/2011, 04/01/2012   Influenza Whole 03/10/2008   Influenza, High Dose Seasonal PF 03/18/2018   Influenza,inj,Quad PF,6+ Mos 04/04/2014   Influenza-Unspecified 04/12/2015   Moderna Covid-19 Vaccine Bivalent Booster 31yr & up 04/29/2021   Moderna Sars-Covid-2 Vaccination 08/26/2019, 09/26/2019, 06/26/2020   Pneumococcal Conjugate-13 10/04/2020   Pneumococcal Polysaccharide-23 10/29/2012   Td 11/15/2003   Zoster Recombinat (Shingrix) 05/22/2017, 01/12/2019    TDAP status: Due, Education has been provided regarding the importance of this vaccine. Advised may receive this vaccine at local pharmacy or Health Dept. Aware to provide a copy of the vaccination record if obtained from local pharmacy or Health Dept. Verbalized acceptance and understanding.  Flu Vaccine status: Up to date  Pneumococcal vaccine status: Up to date  Covid-19 vaccine status: Completed vaccines  Qualifies for Shingles Vaccine? Yes   Zostavax completed No   Shingrix Completed?: Yes  Screening Tests Health Maintenance  Topic Date Due   TETANUS/TDAP  11/14/2013   MAMMOGRAM  12/03/2015   DEXA SCAN  Never done   OPHTHALMOLOGY EXAM  12/22/2015   HEMOGLOBIN A1C  04/05/2021   Pneumonia Vaccine 70 Years old (3 - PPSV23 if available, else PCV20) 10/04/2021   FOOT EXAM  10/04/2021   COLONOSCOPY (Pts 45-443yrInsurance coverage will need to be confirmed)  07/09/2028   INFLUENZA VACCINE  Completed   COVID-19 Vaccine  Completed   Hepatitis C Screening  Completed   Zoster Vaccines- Shingrix  Completed   HPV VACCINES  Aged Out    Health Maintenance  Health Maintenance Due  Topic Date Due   TETANUS/TDAP  11/14/2013   MAMMOGRAM  12/03/2015   DEXA SCAN  Never done   OPHTHALMOLOGY EXAM  12/22/2015   HEMOGLOBIN A1C  04/05/2021    Colorectal cancer screening: Type of  screening: Colonoscopy. Completed 07/09/2018. Repeat every 10 years  Mammogram status: patient family to schedule  Bone Density status: patient family to schedule  Lung Cancer Screening: (Low Dose CT Chest recommended if Age 70-80ears, 30 pack-year currently smoking OR have quit w/in 15years.) does not qualify.   Lung Cancer Screening Referral: no  Additional Screening:  Hepatitis C Screening: does qualify; Completed 05/18/2015  Vision Screening: Recommended annual ophthalmology exams for early detection of glaucoma and other disorders of the eye. Is the patient up to date with their annual eye exam?  No  Who is the provider or what is the name of the office in which the patient attends annual eye exams? [facility] is not established with a provider, would they like to be referred to a provider to establish care? No .   Dental Screening: Recommended annual dental exams for proper oral hygiene  Community Resource Referral / Chronic Care Management: CRR required this visit?  No   CCM required this visit?  No      Plan:     I have personally reviewed and noted the following in the patient's chart:   Medical and social history Use of alcohol, tobacco or illicit drugs  Current medications and supplements including opioid prescriptions. Patient is not currently taking opioid prescriptions. Functional ability and status Nutritional status Physical activity Advanced  directives List of other physicians Hospitalizations, surgeries, and ER visits in previous 12 months Vitals Screenings to include cognitive, depression, and falls Referrals and appointments  In addition, I have reviewed and discussed with patient certain preventive protocols, quality metrics, and best practice recommendations. A written personalized care plan for preventive services as well as general preventive health recommendations were provided to patient.     Kellie Simmering, LPN   62/01/2416   Nurse  Notes: 6 CIT not administered. Patient is followed by neurology.

## 2021-06-10 ENCOUNTER — Other Ambulatory Visit: Payer: Self-pay | Admitting: Adult Health

## 2021-06-11 ENCOUNTER — Other Ambulatory Visit: Payer: Self-pay

## 2021-06-11 ENCOUNTER — Telehealth: Payer: Self-pay

## 2021-06-11 ENCOUNTER — Other Ambulatory Visit: Payer: Self-pay | Admitting: Medical

## 2021-06-11 DIAGNOSIS — I10 Essential (primary) hypertension: Secondary | ICD-10-CM

## 2021-06-11 DIAGNOSIS — E119 Type 2 diabetes mellitus without complications: Secondary | ICD-10-CM

## 2021-06-11 MED ORDER — METFORMIN HCL 500 MG PO TABS: 500.0000 mg | ORAL_TABLET | Freq: Two times a day (BID) | ORAL | 0 refills | Status: DC

## 2021-06-11 MED ORDER — LOSARTAN POTASSIUM 50 MG PO TABS: 50.0000 mg | ORAL_TABLET | Freq: Every day | ORAL | 0 refills | Status: DC

## 2021-06-11 MED ORDER — CITALOPRAM HYDROBROMIDE 20 MG PO TABS: 20.0000 mg | ORAL_TABLET | Freq: Every day | ORAL | 0 refills | Status: DC

## 2021-06-11 NOTE — Telephone Encounter (Signed)
Received fax from [facility] for a refill on Citalopram last apt was 01/17/21 and next apt is 08/16/21.

## 2021-06-27 ENCOUNTER — Other Ambulatory Visit: Payer: Self-pay | Admitting: Medical

## 2021-06-27 DIAGNOSIS — I10 Essential (primary) hypertension: Secondary | ICD-10-CM

## 2021-07-23 ENCOUNTER — Other Ambulatory Visit: Payer: Self-pay | Admitting: Medical

## 2021-07-23 ENCOUNTER — Ambulatory Visit: Payer: Medicare HMO | Admitting: Adult Health

## 2021-07-23 DIAGNOSIS — I10 Essential (primary) hypertension: Secondary | ICD-10-CM

## 2021-07-24 ENCOUNTER — Other Ambulatory Visit: Payer: Self-pay

## 2021-07-24 DIAGNOSIS — E785 Hyperlipidemia, unspecified: Secondary | ICD-10-CM

## 2021-07-24 DIAGNOSIS — E1169 Type 2 diabetes mellitus with other specified complication: Secondary | ICD-10-CM

## 2021-07-24 MED ORDER — ATORVASTATIN CALCIUM 40 MG PO TABS: 40.0000 mg | ORAL_TABLET | Freq: Every day | ORAL | 1 refills | Status: DC

## 2021-08-08 ENCOUNTER — Telehealth: Payer: Self-pay | Admitting: Adult Health

## 2021-08-08 ENCOUNTER — Ambulatory Visit: Payer: Medicare HMO | Admitting: Adult Health

## 2021-08-08 NOTE — Telephone Encounter (Signed)
Pt's daughter, Ivory Broad cancelled appt for today. Pt's son could bring her to appt.

## 2021-08-08 NOTE — Telephone Encounter (Signed)
Noted  

## 2021-08-16 ENCOUNTER — Ambulatory Visit: Payer: Medicare HMO | Admitting: Physician Assistant

## 2021-08-23 ENCOUNTER — Encounter: Payer: Self-pay | Admitting: Physician Assistant

## 2021-09-09 ENCOUNTER — Ambulatory Visit: Payer: Medicare HMO | Admitting: Adult Health

## 2021-09-16 ENCOUNTER — Other Ambulatory Visit: Payer: Self-pay | Admitting: Physician Assistant

## 2021-09-16 DIAGNOSIS — I10 Essential (primary) hypertension: Secondary | ICD-10-CM

## 2021-09-16 DIAGNOSIS — E119 Type 2 diabetes mellitus without complications: Secondary | ICD-10-CM

## 2021-11-02 ENCOUNTER — Other Ambulatory Visit: Payer: Self-pay | Admitting: Physician Assistant

## 2021-11-02 DIAGNOSIS — I10 Essential (primary) hypertension: Secondary | ICD-10-CM

## 2021-11-17 NOTE — Progress Notes (Unsigned)
PATIENT: Deborah Simon DOB: January 14, 1951  REASON FOR VISIT: follow up HISTORY FROM: patient Chief Complaint  Patient presents with   Follow-up    Rm 5, son.  MMSE 18/30     HISTORY OF PRESENT ILLNESS: Today 11/17/21: Deborah Simon is a 71 year old female with a history of memory disturbance.  She returns today for follow-up. Feels that memory is the same. Reports that she is forgetful with short term memory. Lives with her son. Able to complete all ADLs independently. Son helps with her medications, appointments and finances. Not driving. Sleeping ok and reports good appetite. Reports that some foods cause her indigestion. Uses OTC antiacid.   Reports tremor in the upper extremities.  Son notices that with anxiety.  The patient states that she has a tremor every time she eats and sometimes with handwriting   10/08/20: Deborah Simon is a 71 year old female with a history of memory disturbance.  She returns today to discuss her results from her neurocognitive evaluation.  I have reviewed these results with the patient.  Dr. Nicole Kindred have recommended further evaluation with LP and AD biomarkers.  The patient at this time does not want to proceed with any further work-up.  She remains on Namenda 10 mg twice a day.  She lives at home with her daughter in law and son.  She is able to complete all ADLs independently.  She continues to cook without difficulty.  She does not operate a motor vehicle.  Her daughter-in-law manages her medications and appointments.  She denies any trouble sleeping.  She returns today for an evaluation.  04/03/20: Deborah Simon is a 71 year old female with a history of intermittent tremor the returns today for follow-up.  Her main concern today is memory disturbance.  She states that she has some issues with word finding.  She lives at home with her son.  She is able to complete all ADLs independently.  She manages her own finances without difficulty.  She manages her own medications  and appointments.  She does use a pillbox and references a calendar.  She is able to shop independently for her needs but the granddaughter reports that she does need a list.  No change in mood or behavior.  Patient returns today for an evaluation.  HISTORY Deborah Simon is a 71 year old right-handed woman with an underlying medical history of hypertension, hyperlipidemia, type 2 diabetes, and overweight state, who reports an intermittent right hand tremor for the past 4 weeks or so.  Symptoms came on fairly suddenly.  They are primarily related to using her right hand and arm.  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  She has quite a bit of stress at work and is contemplating retiring.  She does better at home.  She gets tearful at times because of stress and trembling and it helps to calm her down by talking to her.  Her son has been helpful in that regard and has been able to calm her down.  Rubbing her hands on her thighs helps calm her down.  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  She does not drink caffeine on a day-to-day basis, she does not drink alcohol and is a  non-smoker.  She tries to hydrate well, drinks about 2 bottles of 16 ounce water per day at work and also water at home.  She lives with one of her 2 sons who are grown.  She has not had any treatment for anxiety or stress. I reviewed your office note from 03/11/2019, which you kindly included.  She had blood work in your office on 03/11/2019 and I reviewed the results: Lipid panel showed benign findings with a total cholesterol of 139, HDL 60, triglycerides 54 and LDL 66.  CMP showed glucose mildly elevated at 120, BUN 19, creatinine 1.14, otherwise normal findings, CBC with differential  showed WBC of 6.5, hemoglobin 11.4 which is mildly low and hematocrit 33.7 which is also mildly low, platelets mildly elevated at 439, otherwise benign findings, A1c was mildly elevated at 6.6. She denies a family history of tremor or Parkinson's disease.  She has not fallen.  She has an eye examination once a year with Dr. Gershon Crane and is due for an appointment.  REVIEW OF SYSTEMS: Out of a complete 14 system review of symptoms, the patient complains only of the following symptoms, and all other reviewed systems are negative.  See HPI  ALLERGIES: Allergies  Allergen Reactions   Omeprazole Other (See Comments)    headache   Ace Inhibitors Swelling    Angioedema    Aricept [Donepezil]     headaches   Azithromycin Swelling   Lisinopril Swelling   Penicillins Other (See Comments)    Note  Sure rash of some sort probably, nothing airway related    HOME MEDICATIONS: Outpatient Medications Prior to Visit  Medication Sig Dispense Refill   albuterol (VENTOLIN HFA) 108 (90 Base) MCG/ACT inhaler INHALE 1-2 PUFFS BY MOUTH EVERY 6 HOURS AS NEEDED FOR WHEEZE OR SHORTNESS OF BREATH 18 each 0   amLODipine (NORVASC) 5 MG tablet Take 5 mg by mouth daily.      aspirin EC 81 MG tablet Take 81 mg by mouth daily.     atorvastatin (LIPITOR) 40 MG tablet Take 1 tablet (40 mg total) by mouth daily. 90 tablet 1   benzonatate (TESSALON) 200 MG capsule Take 1 capsule (200 mg total) by mouth 2 (two) times daily as needed for cough. (Patient not taking: Reported on 05/24/2021) 20 capsule 0   Blood Glucose Monitoring Suppl (FREESTYLE FREEDOM LITE) W/DEVICE KIT 1 Units by Does not apply route once. Use to test blood glucose once a day 1 each 0   cetirizine (ZYRTEC) 10 MG tablet Take 10 mg by mouth daily.     citalopram (CELEXA) 20 MG tablet TAKE 1 TABLET BY MOUTH EVERY DAY 90 tablet 0   Dextromethorphan HBr (DELSYM PO) Take 15 mLs by mouth as needed. (Patient not taking: Reported on 05/24/2021)     docusate sodium  (COLACE) 100 MG capsule Take 100 mg by mouth 2 (two) times daily as needed for mild constipation.  (Patient not taking: Reported on 01/17/2021)     etodolac (LODINE) 400 MG tablet TAKE 1 TABLET BY MOUTH TWICE A DAY 180 tablet 0   ferrous sulfate 325 (65 FE) MG tablet Take 325 mg by mouth daily with breakfast. (Patient not taking: Reported on 01/17/2021)     guaifenesin (ROBITUSSIN) 100 MG/5ML syrup Take 200 mg by mouth 3 (three) times daily as needed for cough.     hydrochlorothiazide (HYDRODIURIL) 25 MG tablet TAKE 1 TABLET (25 MG TOTAL) BY MOUTH DAILY. 30 tablet 1   losartan (COZAAR) 50 MG  tablet TAKE 1 TABLET BY MOUTH EVERY DAY 90 tablet 0   memantine (NAMENDA) 10 MG tablet TAKE 1 TABLET BY MOUTH TWICE A DAY 180 tablet 1   metFORMIN (GLUCOPHAGE) 500 MG tablet TAKE 1 TABLET BY MOUTH 2 TIMES DAILY WITH A MEAL. 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   Diabetes mellitus    Fatty liver 07/12/2019   Hyperlipidemia    Hypertension     PAST SURGICAL HISTORY: Past Surgical History:  Procedure Laterality Date   CHOLECYSTECTOMY  10/07/11   Single site   COLONOSCOPY  03/27/2008   Dr.Patterson-normal    FAMILY HISTORY: Family History  Problem Relation Age of Onset   Stroke Mother    Stroke Father    Aneurysm Son    Colon cancer Neg Hx    Colon polyps Neg Hx    Esophageal cancer Neg Hx    Stomach cancer Neg Hx    Rectal cancer Neg Hx     SOCIAL HISTORY: Social History    Socioeconomic History   Marital status: Single    Spouse name: Not on file   Number of children: 3   Years of education: Not on file   Highest education level: Not on file  Occupational History   Not on file  Tobacco Use   Smoking status: Never   Smokeless tobacco: Never  Vaping Use   Vaping Use: Never used  Substance and Sexual Activity   Alcohol use: No   Drug use: No   Sexual activity: Not Currently  Other Topics Concern   Not on file  Social History Narrative   Not on file   Social Determinants of Health   Financial Resource Strain: Low Risk    Difficulty of Paying Living Expenses: Not hard at all  Food Insecurity: No Food Insecurity   Worried About Charity fundraiser in the Last Year: Never true   Arboriculturist in the Last Year: Never true  Transportation Needs: No Transportation Needs   Lack of Transportation (Medical): No   Lack of Transportation (Non-Medical): No  Physical Activity: Inactive   Days of Exercise per Week: 0 days   Minutes of Exercise per Session: 0 min  Stress: No Stress Concern Present   Feeling of Stress : Not at all  Social Connections: Not on file  Intimate Partner Violence: Not on file      PHYSICAL EXAM  Vitals:   11/18/21 1017  BP: (!) 198/94  Pulse: 78  Weight: 174 lb (78.9 kg)  Height: 5' 4"  (1.626 m)    Body mass index is 29.87 kg/m.      11/18/2021   10:24 AM 10/08/2020   11:03 AM 04/03/2020   10:52 AM  MMSE - Mini Mental State Exam  Orientation to time 2 2 3   Orientation to Place 5 5 5   Registration 3 3 3   Attention/ Calculation 0 0  1  Recall 0 1 0  Language- name 2 objects 2 2 2   Language- repeat 1 0 1  Language- follow 3 step command 3 3 3   Language- read & follow direction 1 1 1   Write a sentence 1 1 1   Copy design 0 1 0  Total score 18 19 20       Generalized: Well developed, in no acute distress   Neurological examination  Mentation: Alert oriented to time, place, history taking. Follows all  commands speech and language fluent Cranial nerve II-XII: Pupils were equal round reactive to light. Extraocular movements were full, visual field were full on confrontational test.  Head turning and shoulder shrug  were normal and symmetric. Motor: The motor testing reveals 5 over 5 strength of all 4 extremities. Good symmetric motor tone is noted throughout.  Sensory: Sensory testing is intact to soft touch on all 4 extremities. No evidence of extinction is noted.  Coordination: Cerebellar testing reveals good finger-nose-finger and heel-to-shin bilaterally. Mild tremor in the upper extremities with finger-nose-finger Gait and station: Gait is normal.  Reflexes: Deep tendon reflexes are symmetric and normal bilaterally.   DIAGNOSTIC DATA (LABS, IMAGING, TESTING) - I reviewed patient records, labs, notes, testing and imaging myself where available.  Lab Results  Component Value Date   WBC 7.2 10/04/2020   HGB 11.2 10/04/2020   HCT 33.4 (L) 10/04/2020   MCV 92 10/04/2020   PLT 422 10/04/2020      Component Value Date/Time   NA 142 10/04/2020 1158   K 3.9 10/04/2020 1158   CL 101 10/04/2020 1158   CO2 19 (L) 10/04/2020 1158   GLUCOSE 161 (H) 10/04/2020 1158   GLUCOSE 131 (H) 10/05/2019 0315   BUN 15 10/04/2020 1158   CREATININE 1.25 (H) 10/04/2020 1158   CREATININE 1.00 (H) 11/16/2015 1008   CALCIUM 10.1 10/04/2020 1158   PROT 7.3 10/04/2020 1158   ALBUMIN 4.7 10/04/2020 1158   AST 21 10/04/2020 1158   ALT 23 10/04/2020 1158   ALKPHOS 115 10/04/2020 1158   BILITOT 0.3 10/04/2020 1158   GFRNONAA 52 (L) 01/25/2020 1456   GFRNONAA 60 11/16/2015 1008   GFRAA 60 01/25/2020 1456   GFRAA 69 11/16/2015 1008   Lab Results  Component Value Date   CHOL 123 10/04/2020   HDL 47 10/04/2020   LDLCALC 62 10/04/2020   LDLDIRECT 131 (H) 09/01/2011   TRIG 67 10/04/2020   CHOLHDL 2.6 10/04/2020   Lab Results  Component Value Date   HGBA1C 6.6 (A) 10/04/2020   Lab Results  Component  Value Date   VITAMINB12 680 09/28/2019   Lab Results  Component Value Date   TSH 1.090 10/04/2020      ASSESSMENT AND PLAN 71 y.o. year old female  has a past medical history of Degenerative joint disease (DJD) of lumbar spine (11/01/2019), Diabetes mellitus, Fatty liver (07/12/2019), Hyperlipidemia, and Hypertension. here with:  1.  Memory disturbance  -MMSE 18/30 previously 19 out of 30 -Continue Namenda 10 mg twice a day -Patient cannot tolerate Aricept due to headaches  2.  Tremor  -Patient's tremor seems consistent with an essential tremor.  Certainly anxiety can exacerbate this.  Discussed medication such as primidone and provided them with information.  They will review and let me know if she wants to do a trial on this medication.  In the meantime advised that they could try wrist weights and see if she finds that beneficial   Advised if symptoms worsen or  she develops new symptoms she should let us know. FU 6 months or sooner if needed    Ward Givens, MSN, NP-C 11/17/2021, 8:38 PM [facility] [address], [place], [place] [address] [phone]

## 2021-11-18 ENCOUNTER — Encounter: Payer: Self-pay | Admitting: Adult Health

## 2021-11-18 ENCOUNTER — Ambulatory Visit: Payer: Medicare HMO | Admitting: Adult Health

## 2021-11-18 VITALS — BP 198/94 | HR 78 | Ht 64.0 in | Wt 174.0 lb

## 2021-11-18 DIAGNOSIS — F0390 Unspecified dementia without behavioral disturbance: Secondary | ICD-10-CM

## 2021-11-18 DIAGNOSIS — R251 Tremor, unspecified: Secondary | ICD-10-CM

## 2021-11-18 NOTE — Patient Instructions (Signed)
Continue Namenda 10 mg BID We can consider a trial of Primidone for tremor Can use writst weights as well  Primidone Tablets What is this medication? PRIMIDONE (PRI mi done) prevents and controls seizures in people with epilepsy. It works by calming overactive nerves in your body. This medicine may be used for other purposes; ask your health care provider or pharmacist if you have questions. COMMON BRAND NAME(S): Mysoline What should I tell my care team before I take this medication? They need to know if you have any of these conditions: Kidney disease Liver disease Porphyria Suicidal thoughts, plans, or attempt by you or a family member An unusual or allergic reaction to primidone, phenobarbital, other barbiturates or seizure medications, other medications, foods, dyes, or preservatives Pregnant or trying to get pregnant Breast-feeding How should I use this medication? Take this medication by mouth with a glass of water. Follow the directions on the prescription label. Take your doses at regular intervals. Do not take your medication more often than directed. Do not stop taking except on the advice of your care team. A special MedGuide will be given to you by the pharmacist with each prescription and refill. Be sure to read this information carefully each time. Contact your care team about the use of this medication in children. Special care may be needed. While this medication may be prescribed for children for selected conditions, precautions do apply. Overdosage: If you think you have taken too much of this medicine contact a poison control center or emergency room at once. NOTE: This medicine is only for you. Do not share this medicine with others. What if I miss a dose? If you miss a dose, take it as soon as you can. If it is almost time for your next dose, take only that dose. Do not take double or extra doses. What may interact with this medication? Do not take this medication with  any of the following: Voriconazole This medication may also interact with the following: Cancer-treating medications Cyclosporine Disopyramide Doxycycline Estrogen or progestin hormones Medications for depression, anxiety or other mental health conditions Medications for treating HIV infection or AIDS Modafinil Prescription pain medications Quinidine Warfarin This list may not describe all possible interactions. Give your health care provider a list of all the medicines, herbs, non-prescription drugs, or dietary supplements you use. Also tell them if you smoke, drink alcohol, or use illegal drugs. Some items may interact with your medicine. What should I watch for while using this medication? Visit your care team for regular checks on your progress. It may be 2 to 3 weeks before you see the full effects of this medication. Do not suddenly stop taking this medication, you may increase the risk of seizures. Your care team may want to gradually reduce the dose. Wear a medical identification bracelet or chain to say you have epilepsy, and carry a card that lists all your medications. You may get drowsy or dizzy. Do not drive, use machinery, or do anything that needs mental alertness until you know how this medication affects you. Do not stand or sit up quickly, especially if you are an older patient. This reduces the risk of dizzy or fainting spells. Alcohol may interfere with the effect of this medication. Avoid alcoholic drinks. Birth control pills may not work properly while you are taking this medication. Talk to your care team about using an extra method of birth control. The use of this medication may increase the chance of suicidal thoughts or actions.  Pay special attention to how you are responding while on this medication. Any worsening of mood, or thoughts of suicide or dying should be reported to your care team right away. Women who become pregnant while using this medication may enroll in  the [place] Antiepileptic Drug Pregnancy Registry by calling [id number]. This registry collects information about the safety of antiepileptic medication use during pregnancy. This medication may cause a decrease in vitamin D and folic acid. You should make sure that you get enough vitamins while you are taking this medication. Discuss the foods you eat and the vitamins you take with your care team. What side effects may I notice from receiving this medication? Side effects that you should report to your care team as soon as possible: Allergic reactions--skin rash, itching, hives, swelling of the face, lips, tongue, or throat CNS depression--slow or shallow breathing, shortness of breath, feeling faint, dizziness, confusion, trouble staying awake Thoughts of suicide or self-harm, worsening mood, or feelings of depression Side effects that usually do not require medical attention (report to your care team if they continue or are bothersome): Dizziness Drowsiness Loss of balance or coordination Nausea This list may not describe all possible side effects. Call your doctor for medical advice about side effects. You may report side effects to FDA at 1-800-FDA-1088. Where should I keep my medication? Keep out of the reach of children and pets. This medication may cause accidental overdose and death if it is taken by other adults, children, or pets. Mix any unused medication with a substance like cat litter or coffee grounds. Then throw the medication away in a sealed container like a sealed bag or a coffee can with a lid. Do not use the medication after the expiration date. Store at room temperature between 15 and 30 degrees C (59 and 86 degrees F). NOTE: This sheet is a summary. It may not cover all possible information. If you have questions about this medicine, talk to your doctor, pharmacist, or health care provider.  2023 Elsevier/Gold Standard (2021-02-04 00:00:00)

## 2021-11-29 ENCOUNTER — Encounter: Payer: Self-pay | Admitting: Adult Health

## 2021-12-02 MED ORDER — PRIMIDONE 50 MG PO TABS: 25.0000 mg | ORAL_TABLET | Freq: Every day | ORAL | 3 refills | Status: DC

## 2021-12-09 ENCOUNTER — Other Ambulatory Visit: Payer: Self-pay | Admitting: Physician Assistant

## 2021-12-09 ENCOUNTER — Other Ambulatory Visit: Payer: Self-pay | Admitting: Adult Health

## 2021-12-09 DIAGNOSIS — I10 Essential (primary) hypertension: Secondary | ICD-10-CM

## 2021-12-09 DIAGNOSIS — E119 Type 2 diabetes mellitus without complications: Secondary | ICD-10-CM

## 2021-12-09 NOTE — Telephone Encounter (Signed)
Ok to refill 

## 2021-12-13 ENCOUNTER — Encounter: Payer: Self-pay | Admitting: Internal Medicine

## 2021-12-16 ENCOUNTER — Ambulatory Visit: Payer: Medicare HMO | Admitting: Adult Health

## 2022-01-31 ENCOUNTER — Other Ambulatory Visit: Payer: Self-pay | Admitting: Physician Assistant

## 2022-01-31 DIAGNOSIS — I10 Essential (primary) hypertension: Secondary | ICD-10-CM

## 2022-02-19 ENCOUNTER — Encounter: Payer: Self-pay | Admitting: Internal Medicine

## 2022-02-27 ENCOUNTER — Other Ambulatory Visit: Payer: Self-pay | Admitting: Physician Assistant

## 2022-02-27 DIAGNOSIS — E785 Hyperlipidemia, unspecified: Secondary | ICD-10-CM

## 2022-02-27 DIAGNOSIS — I10 Essential (primary) hypertension: Secondary | ICD-10-CM

## 2022-02-27 DIAGNOSIS — E119 Type 2 diabetes mellitus without complications: Secondary | ICD-10-CM

## 2022-02-28 NOTE — Telephone Encounter (Signed)
[facility] is requesting to fill pt [facility]. Pt was made an appointment with you 03/25/22. Please advise if you would like to fill. KH

## 2022-03-21 ENCOUNTER — Telehealth: Payer: Self-pay | Admitting: Physician Assistant

## 2022-03-21 DIAGNOSIS — R059 Cough, unspecified: Secondary | ICD-10-CM

## 2022-03-21 MED ORDER — ALBUTEROL SULFATE HFA 108 (90 BASE) MCG/ACT IN AERS: INHALATION_SPRAY | RESPIRATORY_TRACT | 0 refills | Status: DC

## 2022-03-21 NOTE — Telephone Encounter (Signed)
Pt requesting refill on Albuterol inhaler ( she has med check appt next week)  She has developed a cough and is needing her inhaler refilled  [facility]

## 2022-03-21 NOTE — Telephone Encounter (Signed)
This was sent to me.

## 2022-03-21 NOTE — Telephone Encounter (Signed)
She is scheduled to see Deborah Simon next week. Hasn't had a med check in forever (just AWV with nurse 05/2021), no labs since 09/2020 (and has DM, HTN, HLD).  I refilled the albuterol (but if she is sick that might affect her visit next week...)

## 2022-03-21 NOTE — Telephone Encounter (Signed)
Gabriel Cirri,  You may want to check with this patient Monday afternoon to see if she is sick as she has an appt Tues in person.

## 2022-03-23 ENCOUNTER — Other Ambulatory Visit: Payer: Self-pay | Admitting: Medical

## 2022-03-23 DIAGNOSIS — E119 Type 2 diabetes mellitus without complications: Secondary | ICD-10-CM

## 2022-03-23 DIAGNOSIS — E1169 Type 2 diabetes mellitus with other specified complication: Secondary | ICD-10-CM

## 2022-03-23 DIAGNOSIS — I10 Essential (primary) hypertension: Secondary | ICD-10-CM

## 2022-03-24 NOTE — Telephone Encounter (Signed)
Spoke with patient's daughter and she can wait until tomorrow for refills

## 2022-03-24 NOTE — Telephone Encounter (Signed)
Pt is coming tomorrow before appt to be tested for covid

## 2022-03-25 ENCOUNTER — Encounter: Payer: Self-pay | Admitting: Internal Medicine

## 2022-03-25 ENCOUNTER — Other Ambulatory Visit: Payer: Medicare HMO

## 2022-03-25 ENCOUNTER — Other Ambulatory Visit: Payer: Self-pay | Admitting: Medical

## 2022-03-25 ENCOUNTER — Ambulatory Visit (INDEPENDENT_AMBULATORY_CARE_PROVIDER_SITE_OTHER): Payer: Medicare HMO | Admitting: Medical

## 2022-03-25 VITALS — BP 150/84 | HR 81 | Wt 166.4 lb

## 2022-03-25 DIAGNOSIS — Z23 Encounter for immunization: Secondary | ICD-10-CM

## 2022-03-25 DIAGNOSIS — E1169 Type 2 diabetes mellitus with other specified complication: Secondary | ICD-10-CM

## 2022-03-25 DIAGNOSIS — R55 Syncope and collapse: Secondary | ICD-10-CM | POA: Diagnosis not present

## 2022-03-25 DIAGNOSIS — E119 Type 2 diabetes mellitus without complications: Secondary | ICD-10-CM

## 2022-03-25 DIAGNOSIS — E785 Hyperlipidemia, unspecified: Secondary | ICD-10-CM

## 2022-03-25 DIAGNOSIS — E1122 Type 2 diabetes mellitus with diabetic chronic kidney disease: Secondary | ICD-10-CM

## 2022-03-25 DIAGNOSIS — I10 Essential (primary) hypertension: Secondary | ICD-10-CM

## 2022-03-25 DIAGNOSIS — K760 Fatty (change of) liver, not elsewhere classified: Secondary | ICD-10-CM

## 2022-03-25 DIAGNOSIS — J4530 Mild persistent asthma, uncomplicated: Secondary | ICD-10-CM | POA: Diagnosis not present

## 2022-03-25 DIAGNOSIS — E11319 Type 2 diabetes mellitus with unspecified diabetic retinopathy without macular edema: Secondary | ICD-10-CM | POA: Diagnosis not present

## 2022-03-25 DIAGNOSIS — G252 Other specified forms of tremor: Secondary | ICD-10-CM | POA: Diagnosis not present

## 2022-03-25 NOTE — Telephone Encounter (Signed)
Pt just had labs done today . Waiting to make sure nothing changes. [place]

## 2022-03-25 NOTE — Progress Notes (Signed)
Subjective:  Deborah Simon is a 71 y.o. female who presents for Chief Complaint  Patient presents with   fasting med check    Fasting med check. Went to sister house last week and family noticed seizure like episodes 3 times but since picking her up from [facility] Saturday has not noticed any episodes. Has been sick for 2 weeks ago- having some difficulty breathing- taking inhaler that helps but would like a nebulizer to help also. Flu shot today      Here today with Deborah Simon, daughter in Sports coach.  She has a history of dementia.  Medical team: Deborah Givens, NP, neurology Deborah Simon, Deborah Trellis Paganini, PA, GI No cardiologist Chasten Blaze, [name], PA-C here now, formerly was [name], NP  Concerns: Here with her caregiver and daughter-in-law Deborah Simon.  Her medical history includes dementia, diabetes, hypertension, hyperlipidemia, GERD, chronic back pain, asthma, fatty liver disease, tremor, diabetic retinopathy.  Started getting cough a few weeks ago, gradually got worse, had sinus congestion.   Was more in the chest last week, some difficulty with breathing.  They apparently called in our office to inquire about albuterol inhaler.   This was called out and she is using this.   Has seen some improvement since 4-5 days ago.    Thinks she had a lingering cold.   Symptoms still lingering.   Still using albuterol on average BID , Coricidin HBP OTC, robitussin syrup.  She went to visit her siblings in [place] in the past week.  Family picked her up this weekend early as her siblings were concerned about 3 episodes where she seemed to be like passing out.  Was in back seat, seemed to fall asleep and drop the drink that was in her hand.   At a festival recently she "passed out" in a chair and dropped her beverages.  On a different time she was on the porch and this happened again.  They did note that she was shaking but they felt like it was like her usual tremors.  No obvious convulsions.  They felt that  her episodes of passing out she was out seconds to minutes but would awake or respond when someone touched her or talk to her during 1 of these episodes  Supposed to have been primarily a med check visit for routine.   Fasting today.  Deborah Simon her daughter in law helps take care of her and dispenses her medications.   [name] lives with them.   Sometimes cooks supervised by family.  She still does her on dressing, showering, toileting.   Diabetes- doesn't have glucometer.  No known hypoglycemic or hyperglycemic episodes did not checking sugars.  No polyuria or polydipsia.  She is reportedly compliant with all medicines listed today.  Her daughter all dispenses the medications.  No other aggravating or relieving factors.    No other c/o.  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     atorvastatin (LIPITOR) 40 MG tablet TAKE 1 TABLET BY MOUTH EVERY  DAY 30 tablet 0   citalopram (CELEXA) 20 MG tablet TAKE 1 TABLET BY MOUTH EVERY DAY 90 tablet 0   hydrochlorothiazide (HYDRODIURIL) 25 MG tablet TAKE 1 TABLET (25 MG TOTAL) BY MOUTH DAILY. 30 tablet 0   losartan (COZAAR) 50 MG tablet TAKE 1 TABLET BY MOUTH EVERY DAY 30 tablet 0   memantine (NAMENDA) 10 MG tablet TAKE 1 TABLET BY MOUTH TWICE A DAY 180 tablet 1   metFORMIN (GLUCOPHAGE) 500 MG tablet TAKE 1 TABLET BY MOUTH TWICE A DAY WITH FOOD 60 tablet 0   OMEPRAZOLE PO Take by mouth.     primidone (MYSOLINE) 50 MG tablet Take 0.5 tablets (25 mg total) by mouth at bedtime. 30 tablet 3   docusate sodium (COLACE) 100 MG  capsule Take 100 mg by mouth 2 (two) times daily as needed for mild constipation. (Patient not taking: Reported on 03/25/2022)     No current facility-administered medications on file prior to visit.   Past Surgical History:  Procedure Laterality Date   CHOLECYSTECTOMY  10/07/11   Single site   COLONOSCOPY  03/27/2008   DeborahPatterson-normal   Family History  Problem Relation Age of Onset   Stroke Mother    Stroke Father    Aneurysm Son    Colon cancer Neg Hx    Colon polyps Neg Hx    Esophageal cancer Neg Hx    Stomach cancer Neg Hx    Rectal cancer Neg Hx    The following portions of the patient's history were reviewed and updated as appropriate: allergies, current medications, past family history, past medical history, past social history, past surgical history and problem list.  ROS Otherwise as in subjective above    Objective: BP (!) 150/84   Pulse 81   Wt 166 lb 6.4 oz (75.5 kg)   LMP  (LMP Unknown)   BMI 28.56 kg/m   BP Readings from Last 3 Encounters:  03/25/22 (!) 150/84  11/18/21 (!) 198/94  10/08/20 (!) 172/80   Wt Readings from Last 3 Encounters:  03/25/22 166 lb 6.4 oz (75.5 kg)  11/18/21 174 lb (78.9 kg)  05/24/21 150 lb (68 kg)   General appearance: alert, no distress, well developed, well nourished HEENT: normocephalic, sclerae anicteric, conjunctiva pink and moist, TMs flat, nares without discharge, but mild erythema, pharynx normal Oral cavity: MMM, no lesions Neck: supple, no lymphadenopathy, no thyromegaly, no masses, no bruits Heart: RRR, normal S1, S2, no murmurs Lungs: CTA bilaterally, no wheezes, rhonchi, or rales Abdomen: +bs, soft, non tender, non distended, no masses, no hepatomegaly, no splenomegaly Pulses: 2+ radial pulses, 2+ pedal pulses, normal cap refill Ext: no edema Neuro: She is alert but not oriented, she was unable to give month, year, date, or which office she was at today other than the doctor's office.  Otherwise CN II  through XII intact, nonfocal exam otherwise. Psych: Pleasant, answers questions, conversive  EKG reviewed    Assessment: Encounter Diagnoses  Name Primary?   Type 2 diabetes mellitus with chronic kidney disease, without long-term current use of insulin, unspecified CKD stage ([place]) Yes   Mild persistent asthma without complication    Essential hypertension    Fatty liver    Hyperlipidemia associated with type 2 diabetes mellitus ([place])    Syncope, unspecified syncope type    Intention tremor    Diabetic retinopathy associated with controlled type 2 diabetes mellitus ([place])    Need for influenza vaccination      Plan: We discussed  the syncopal type episodes she has had recently.  We discussed possible causes which could be neurologic related, cardiac related, hypoglycemia or other.  We will get her a glucometer testing supplies that she does not have these currently.  EKG reviewed, labs today.  We will likely communicate with her neurologist about the symptoms in case they need to see her back, but it is not clear what the cause of the syncopal episodes are yet.  I reassured that the episodes did not necessarily sound like seizures  Asthma-using albuterol currently with the recent URI symptoms she has had but otherwise does okay with breathing  Hypertension-blood pressure little elevated today.  Labs today.  Continue current medications.  Pending labs we will likely need to make some modifications to her regimen as her pressures historically it appears have been elevated in the past year or 2  Diabetes-glucometer and testing supplies prescription today, continue current medications, avoid hypoglycemia  Hyperlipidemia-continue statin and aspirin  Tremor-continue primidone, sees neurology  Vaccine recommendations: Counseled on the influenza virus vaccine.  Vaccine information sheet given.   High dose Influenza vaccine given after consent obtained.  Get your Tdap tetanus booster at  the pharmacy.    Jacqueline was seen today for fasting med check.  Diagnoses and all orders for this visit:  Type 2 diabetes mellitus with chronic kidney disease, without long-term current use of insulin, unspecified CKD stage (HCC) -     Hemoglobin A1c -     Microalbumin/Creatinine Ratio, Urine  Mild persistent asthma without complication  Essential hypertension -     Microalbumin/Creatinine Ratio, Urine  Fatty liver -     Comprehensive metabolic panel  Hyperlipidemia associated with type 2 diabetes mellitus (HCC) -     Comprehensive metabolic panel -     Lipid panel  Syncope, unspecified syncope type -     Comprehensive metabolic panel -     CBC -     TSH -     EKG 12-Lead  Intention tremor  Diabetic retinopathy associated with controlled type 2 diabetes mellitus ([place])  Need for influenza vaccination -     Flu Vaccine QUAD High Dose(Fluad)  Spent > 45 minutes face to face with patient in discussion of symptoms, evaluation, plan and recommendations.    Follow up: pending labs

## 2022-03-26 ENCOUNTER — Other Ambulatory Visit: Payer: Self-pay | Admitting: Medical

## 2022-03-26 DIAGNOSIS — E1169 Type 2 diabetes mellitus with other specified complication: Secondary | ICD-10-CM

## 2022-03-26 DIAGNOSIS — R059 Cough, unspecified: Secondary | ICD-10-CM

## 2022-03-26 DIAGNOSIS — E119 Type 2 diabetes mellitus without complications: Secondary | ICD-10-CM

## 2022-03-26 LAB — CBC
Hematocrit: 34.4 % (ref 34.0–46.6)
Hemoglobin: 11.7 g/dL (ref 11.1–15.9)
MCH: 29.3 pg (ref 26.6–33.0)
MCHC: 34 g/dL (ref 31.5–35.7)
MCV: 86 fL (ref 79–97)
Platelets: 416 10*3/uL (ref 150–450)
RBC: 4 x10E6/uL (ref 3.77–5.28)
RDW: 14.1 % (ref 11.7–15.4)
WBC: 7.9 10*3/uL (ref 3.4–10.8)

## 2022-03-26 LAB — COMPREHENSIVE METABOLIC PANEL
ALT: 33 IU/L — ABNORMAL HIGH (ref 0–32)
AST: 34 IU/L (ref 0–40)
Albumin/Globulin Ratio: 1.9 (ref 1.2–2.2)
Albumin: 4.8 g/dL (ref 3.8–4.8)
Alkaline Phosphatase: 159 IU/L — ABNORMAL HIGH (ref 44–121)
BUN/Creatinine Ratio: 9 — ABNORMAL LOW (ref 12–28)
BUN: 10 mg/dL (ref 8–27)
Bilirubin Total: 0.3 mg/dL (ref 0.0–1.2)
CO2: 26 mmol/L (ref 20–29)
Calcium: 9.7 mg/dL (ref 8.7–10.3)
Chloride: 97 mmol/L (ref 96–106)
Creatinine, Ser: 1.14 mg/dL — ABNORMAL HIGH (ref 0.57–1.00)
Globulin, Total: 2.5 g/dL (ref 1.5–4.5)
Glucose: 113 mg/dL — ABNORMAL HIGH (ref 70–99)
Potassium: 3.6 mmol/L (ref 3.5–5.2)
Sodium: 139 mmol/L (ref 134–144)
Total Protein: 7.3 g/dL (ref 6.0–8.5)
eGFR: 51 mL/min/{1.73_m2} — ABNORMAL LOW (ref >59)

## 2022-03-26 LAB — MICROALBUMIN / CREATININE URINE RATIO
Creatinine, Urine: 154.5 mg/dL
Microalb/Creat Ratio: 35 mg/g creat — ABNORMAL HIGH (ref 0–29)
Microalbumin, Urine: 53.5 ug/mL

## 2022-03-26 LAB — HEMOGLOBIN A1C
Est. average glucose Bld gHb Est-mCnc: 171 mg/dL
Hgb A1c MFr Bld: 7.6 % — ABNORMAL HIGH (ref 4.8–5.6)

## 2022-03-26 LAB — LIPID PANEL
Chol/HDL Ratio: 2.5 ratio (ref 0.0–4.4)
Cholesterol, Total: 123 mg/dL (ref 100–199)
HDL: 50 mg/dL (ref >39)
LDL Chol Calc (NIH): 58 mg/dL (ref 0–99)
Triglycerides: 73 mg/dL (ref 0–149)
VLDL Cholesterol Cal: 15 mg/dL (ref 5–40)

## 2022-03-26 LAB — TSH: TSH: 0.991 u[IU]/mL (ref 0.450–4.500)

## 2022-03-26 MED ORDER — ALBUTEROL SULFATE HFA 108 (90 BASE) MCG/ACT IN AERS: INHALATION_SPRAY | RESPIRATORY_TRACT | 1 refills | Status: DC

## 2022-03-26 MED ORDER — ATORVASTATIN CALCIUM 40 MG PO TABS: 40.0000 mg | ORAL_TABLET | Freq: Every day | ORAL | 3 refills | Status: DC

## 2022-03-26 MED ORDER — VITAMIN D 50 MCG (2000 UT) PO CAPS: 1.0000 | ORAL_CAPSULE | Freq: Every day | ORAL | 3 refills | Status: DC

## 2022-03-26 MED ORDER — METFORMIN HCL 500 MG PO TABS: 500.0000 mg | ORAL_TABLET | Freq: Two times a day (BID) | ORAL | 1 refills | Status: DC

## 2022-03-26 MED ORDER — VALSARTAN-HYDROCHLOROTHIAZIDE 160-12.5 MG PO TABS: 1.0000 | ORAL_TABLET | Freq: Every day | ORAL | 1 refills | Status: DC

## 2022-04-02 ENCOUNTER — Telehealth: Payer: Self-pay | Admitting: Medical

## 2022-04-02 ENCOUNTER — Other Ambulatory Visit: Payer: Self-pay | Admitting: Medical

## 2022-04-02 MED ORDER — PROMETHAZINE-DM 6.25-15 MG/5ML PO SYRP: 5.0000 mL | ORAL_SOLUTION | Freq: Four times a day (QID) | ORAL | 0 refills | Status: DC | PRN

## 2022-04-02 MED ORDER — DOXYCYCLINE HYCLATE 100 MG PO TBEC: 100.0000 mg | DELAYED_RELEASE_TABLET | Freq: Two times a day (BID) | ORAL | 0 refills | Status: DC

## 2022-04-02 NOTE — Telephone Encounter (Signed)
Still has cough, no improvement  Robitussin not helping, tried soothing teas, cough drops, nothing helps Cough waking her up at night

## 2022-04-02 NOTE — Telephone Encounter (Signed)
Left message for sharon about meds being sent to pharmacy

## 2022-04-17 ENCOUNTER — Ambulatory Visit (INDEPENDENT_AMBULATORY_CARE_PROVIDER_SITE_OTHER): Payer: Medicare HMO | Admitting: Medical

## 2022-04-17 ENCOUNTER — Other Ambulatory Visit: Payer: Self-pay | Admitting: Medical

## 2022-04-17 VITALS — BP 142/92 | HR 83 | Wt 165.6 lb

## 2022-04-17 DIAGNOSIS — I10 Essential (primary) hypertension: Secondary | ICD-10-CM

## 2022-04-17 DIAGNOSIS — E1122 Type 2 diabetes mellitus with diabetic chronic kidney disease: Secondary | ICD-10-CM | POA: Diagnosis not present

## 2022-04-17 DIAGNOSIS — R059 Cough, unspecified: Secondary | ICD-10-CM

## 2022-04-17 DIAGNOSIS — E1169 Type 2 diabetes mellitus with other specified complication: Secondary | ICD-10-CM | POA: Diagnosis not present

## 2022-04-17 DIAGNOSIS — E785 Hyperlipidemia, unspecified: Secondary | ICD-10-CM | POA: Diagnosis not present

## 2022-04-17 DIAGNOSIS — E11319 Type 2 diabetes mellitus with unspecified diabetic retinopathy without macular edema: Secondary | ICD-10-CM | POA: Diagnosis not present

## 2022-04-17 DIAGNOSIS — J4530 Mild persistent asthma, uncomplicated: Secondary | ICD-10-CM | POA: Diagnosis not present

## 2022-04-17 MED ORDER — PANTOPRAZOLE SODIUM 40 MG PO TBEC: DELAYED_RELEASE_TABLET | ORAL | 1 refills | Status: DC

## 2022-04-17 MED ORDER — BLOOD GLUCOSE METER KIT: PACK | 0 refills | Status: DC

## 2022-04-17 MED ORDER — GLIPIZIDE ER 2.5 MG PO TB24: 2.5000 mg | ORAL_TABLET | Freq: Every day | ORAL | 1 refills | Status: DC

## 2022-04-17 NOTE — Progress Notes (Signed)
Sent this in to pharmacy pending on pt's insurance is what they will fill for her

## 2022-04-17 NOTE — Addendum Note (Signed)
Addended by: Minette Headland A on: 04/17/2022 02:34 PM   Modules accepted: Orders

## 2022-04-17 NOTE — Progress Notes (Signed)
Subjective:  Deborah Simon is a 71 y.o. female who presents for Chief Complaint  Patient presents with   follow-up    Follow-up on BP, kidney and electrolytes, sometimes eating will cause her to have cough spells. He will give pepcid right before she eats that will help alittle but she then will have issues with cough. Having a lot of shaking and tremors.      Medical team: Ward Givens, NP, neurology Dr. Wilfrid Lund, Amy Trellis Paganini, PA, GI No cardiologist [name], [name], PA-C here now, formerly was [name], NP  Concerns: Here with her son Deborah Simon.  Her medical history includes dementia, diabetes, hypertension, hyperlipidemia, GERD, chronic back pain, asthma, fatty liver disease, tremor, diabetic retinopathy.  Last visit 03/25/2022 we had referred for home sleep eval and [facility] monitoring for arrhythmia.  They declined when [facility] reached out to them.  Last visit she started vitamin D and is compliant with this  Last visit stopped separate losartan and HCTZ and we changed to valsartan HCT.  Tolerating this fine  Diabetes- they are not real keen on metformin and she gets loose stools.  Wants to try some thing else.    Having some gastric issues.   Having burping after every drink.  Having cough after eating.   Still has ongoing cough for weeks.  Started with sinus/cold several weeks ago.  But lately GERD seems to worsen the cough .   No other aggravating or relieving factors.    No other c/o.  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      atorvastatin (LIPITOR) 40 MG tablet Take 1 tablet (40 mg total) by mouth daily. 90 tablet 3   Cholecalciferol (VITAMIN D) 50 MCG (2000 UT) CAPS Take 1 capsule (2,000 Units total) by mouth daily. 90 capsule 3   citalopram (CELEXA) 20 MG tablet TAKE 1 TABLET BY MOUTH EVERY DAY 90 tablet 0   Famotidine (PEPCID PO) Take by mouth.     memantine (NAMENDA) 10 MG tablet TAKE 1 TABLET BY MOUTH TWICE A DAY 180 tablet 1   metFORMIN (GLUCOPHAGE) 500 MG tablet Take 1 tablet (500 mg total) by mouth 2 (two) times daily with a meal. 180 tablet 1   primidone (MYSOLINE) 50 MG tablet Take 0.5 tablets (25 mg total) by mouth at bedtime. 30 tablet 3   valsartan-hydrochlorothiazide (DIOVAN HCT) 160-12.5 MG tablet Take 1 tablet by mouth daily. 30 tablet 1   No current facility-administered medications on file prior to visit.   Past Surgical History:  Procedure Laterality Date   CHOLECYSTECTOMY  10/07/11   Single site   COLONOSCOPY  03/27/2008   Dr.Patterson-normal   Family History  Problem Relation Age of Onset   Stroke Mother    Stroke Father    Aneurysm Son    Colon cancer Neg Hx    Colon polyps Neg Hx    Esophageal cancer Neg Hx    Stomach cancer Neg Hx    Rectal cancer Neg Hx  The following portions of the patient's history were reviewed and updated as appropriate: allergies, current medications, past family history, past medical history, past social history, past surgical history and problem list.  ROS Otherwise as in subjective above    Objective: BP (!) 142/92   Pulse 83   Wt 165 lb 9.6 oz (75.1 kg)   LMP  (LMP Unknown)   BMI 28.43 kg/m   BP Readings from Last 3 Encounters:  04/17/22 (!) 142/92  03/25/22 (!) 150/84  11/18/21 (!) 198/94   Wt Readings from Last 3 Encounters:  04/17/22 165 lb 9.6 oz (75.1 kg)  03/25/22 166 lb 6.4 oz (75.5 kg)  11/18/21 174 lb (78.9 kg)   General appearance: alert, no distress, well developed, well nourished HEENT: normocephalic, sclerae anicteric,  conjunctiva pink and moist, TMs flat, nares without discharge, but mild erythema, pharynx normal Oral cavity: MMM, no lesions Neck: supple, no lymphadenopathy, no thyromegaly, no masses, no bruits, no JVD Heart: RRR, normal S1, S2, no murmurs Lungs: decreased sounds on right upper fields, otherwise no wheezes, rhonchi, or rales Pulses: 2+ radial pulses, 2+ pedal pulses, normal cap refill Ext: no edema Psych: Pleasant, answers questions, conversive    Assessment: Encounter Diagnoses  Name Primary?   Essential hypertension Yes   Mild persistent asthma without complication    Diabetic retinopathy associated with controlled type 2 diabetes mellitus (HCC)    Type 2 diabetes mellitus with chronic kidney disease, without long-term current use of insulin, unspecified CKD stage (HCC)    Hyperlipidemia associated with type 2 diabetes mellitus (HCC)    Cough, unspecified type       Plan: We discussed symptoms and concerns.  Son really didn't want her on metformin.  We discussed her med list, problems list and recommendations as below.  Of note, PFT abnormal today.    Patient Instructions  Diabetes Begin Glipizide 2.5mg  XL 1 tablet daily Either STOP metformin for now or try using Metformin 500mg , 1/2 tablet twice daily since she has some loose stools Check sugars fasting in the mornings, and check any time she feels weak, sweaty or lightheaded.   We have to avoid glucose under 80 in her case  High blood pressure Blood pressures are improved on Valsartan HCT 160/12.5mg  daily We may end up adding Toprol later, but given the other changes today, I don't want to change too many things at once For the time being continue Primidone.   Cough Your breathing test today shows abnormal function I recommend using your albuterol inhaler twice daily for now.   We may recommend adding a "preventative inhaler" such as Advair or Symbicort, but lets see if the couhg improves with the inhaler. Go for  chest xray  Please call to schedule your chest xray.   [facility]  [phone] [address] [place], [place]   Reflux Stop famotidine Pepcid as it doesn't seem to be helping Begin trial of Protonix 40mg .  Take this 30 minutes or so before breakfast.  You can still use antacids such as tums or rolaids as needed Avoid or limit spicy foods, peppers, greasy foods, citrus, tomatoes, and don't rush through a meal    Deborah Simon was seen today for follow-up.  Diagnoses and all orders for this visit:  Essential hypertension  Mild persistent asthma without complication -     Spirometry with Graph -     DG Chest 2 View; Future  Diabetic retinopathy associated with controlled  type 2 diabetes mellitus (HCC)  Type 2 diabetes mellitus with chronic kidney disease, without long-term current use of insulin, unspecified CKD stage (HCC)  Hyperlipidemia associated with type 2 diabetes mellitus (HCC)  Cough, unspecified type -     DG Chest 2 View; Future  Other orders -     glipiZIDE (GLUCOTROL XL) 2.5 MG 24 hr tablet; Take 1 tablet (2.5 mg total) by mouth daily with breakfast. -     pantoprazole (PROTONIX) 40 MG tablet; 1 tablet daily po 45 min prior to breakfast    Follow up: pending chest xray

## 2022-04-17 NOTE — Patient Instructions (Addendum)
Diabetes Begin Glipizide 2.5mg  XL 1 tablet daily Either STOP metformin for now or try using Metformin 500mg , 1/2 tablet twice daily since she has some loose stools Check sugars fasting in the mornings, and check any time she feels weak, sweaty or lightheaded.   We have to avoid glucose under 80 in her case  High blood pressure Blood pressures are improved on Valsartan HCT 160/12.5mg  daily We may end up adding Toprol later, but given the other changes today, I don't want to change too many things at once For the time being continue Primidone.   Cough Your breathing test today shows abnormal function I recommend using your albuterol inhaler twice daily for now.   We may recommend adding a "preventative inhaler" such as Advair or Symbicort, but lets see if the couhg improves with the inhaler. Go for chest xray  Please call to schedule your chest xray.   [facility]  [phone] [address], [place], [place] [address]   Reflux Stop famotidine Pepcid as it doesn't seem to be helping Begin trial of Protonix 40mg .  Take this 30 minutes or so before breakfast.  You can still use antacids such as tums or rolaids as needed Avoid or limit spicy foods, peppers, greasy foods, citrus, tomatoes, and don't rush through a meal

## 2022-04-30 ENCOUNTER — Ambulatory Visit
Admission: RE | Admit: 2022-04-30 | Discharge: 2022-04-30 | Disposition: A | Discharge status: Home / Self Care | Payer: Medicare HMO | Source: Ambulatory Visit | Attending: Medical | Admitting: Medical

## 2022-04-30 DIAGNOSIS — R059 Cough, unspecified: Secondary | ICD-10-CM

## 2022-04-30 DIAGNOSIS — R0602 Shortness of breath: Secondary | ICD-10-CM | POA: Diagnosis not present

## 2022-04-30 DIAGNOSIS — J4530 Mild persistent asthma, uncomplicated: Secondary | ICD-10-CM

## 2022-05-02 ENCOUNTER — Other Ambulatory Visit: Payer: Self-pay | Admitting: Medical

## 2022-05-02 MED ORDER — CEFUROXIME AXETIL 500 MG PO TABS: 500.0000 mg | ORAL_TABLET | Freq: Two times a day (BID) | ORAL | 0 refills | Status: DC

## 2022-05-02 MED ORDER — BENZONATATE 100 MG PO CAPS: 100.0000 mg | ORAL_CAPSULE | Freq: Four times a day (QID) | ORAL | 0 refills | Status: DC | PRN

## 2022-05-06 ENCOUNTER — Other Ambulatory Visit: Payer: Self-pay | Admitting: Medical

## 2022-05-06 MED ORDER — BREZTRI AEROSPHERE 160-9-4.8 MCG/ACT IN AERO: 2.0000 | INHALATION_SPRAY | Freq: Two times a day (BID) | RESPIRATORY_TRACT | 0 refills | Status: DC

## 2022-05-09 ENCOUNTER — Other Ambulatory Visit: Payer: Self-pay | Admitting: Medical

## 2022-05-24 ENCOUNTER — Other Ambulatory Visit: Payer: Self-pay | Admitting: Adult Health

## 2022-05-26 ENCOUNTER — Encounter: Payer: Self-pay | Admitting: Adult Health

## 2022-05-26 ENCOUNTER — Ambulatory Visit: Payer: Medicare HMO | Admitting: Adult Health

## 2022-05-26 ENCOUNTER — Other Ambulatory Visit: Payer: Self-pay | Admitting: Family Medicine

## 2022-05-26 ENCOUNTER — Other Ambulatory Visit: Payer: Self-pay | Admitting: Adult Health

## 2022-05-26 VITALS — BP 124/70 | HR 86 | Ht 64.0 in | Wt 173.0 lb

## 2022-05-26 DIAGNOSIS — F0390 Unspecified dementia without behavioral disturbance: Secondary | ICD-10-CM | POA: Diagnosis not present

## 2022-05-26 DIAGNOSIS — R251 Tremor, unspecified: Secondary | ICD-10-CM | POA: Diagnosis not present

## 2022-05-26 MED ORDER — DONEPEZIL HCL 5 MG PO TABS: 5.0000 mg | ORAL_TABLET | Freq: Every day | ORAL | 0 refills | Status: DC

## 2022-05-26 MED ORDER — PRIMIDONE 50 MG PO TABS: 50.0000 mg | ORAL_TABLET | Freq: Every day | ORAL | 6 refills | Status: DC

## 2022-05-26 NOTE — Patient Instructions (Signed)
Your Plan:  Continue namenda 10 mg twice a day  Increase primidone 50 mg at bedtime Retry Aricept 5 mg at bedtime. If causes headaches again stop medication. If your symptoms worsen or you develop new symptoms please let us know.    Thank you for coming to see [place] at [facility]. I hope we have been able to provide you high quality care today.  You may receive a patient satisfaction survey over the next few weeks. We would appreciate your feedback and comments so that we may continue to improve ourselves and the health of our patients.

## 2022-05-26 NOTE — Progress Notes (Signed)
PATIENT: Deborah Simon DOB: 03-30-1951  REASON FOR VISIT: follow up HISTORY FROM: patient Chief Complaint  Patient presents with   RM 4    Here with Deborah Simon, her daughter-in-law. She states she has been doing well. Deborah Simon states they have noticed quite a few changes, more difficulty with trying to remember things. She has some trouble with daily activities. They limit her cooking to only being able to cook eggs in the morning. Anxiety has gone up. Still able to get self dressed in AM. MMSE 13/30 Animals 7.     HISTORY OF PRESENT ILLNESS: Today 05/26/22:  Deborah Simon is a 71 y.o. female with a history of memory disturbance and essential tremor.  She returns today for follow-up.  She continues to live with her son and daughter-in-law.  She is able to complete all ADLs independently.  Her son helps her with her medications ,, appointments and finances.  She does not operate a motor vehicle.  Denies any trouble sleeping.  At the last visit we did order primidone 25 mg at bedtime for essential tremor.  Daughter in law has not seen a big improvement in the tremors. Daughter in law would like the patient to retry Aricept as she is not convinced that mediation caused her headaches. Patient agrees.    11/18/21: Deborah Simon is a 71 year old female with a history of memory disturbance.  She returns today for follow-up. Feels that memory is the same. Reports that she is forgetful with short term memory. Lives with her son. Able to complete all ADLs independently. Son helps with her medications, appointments and finances. Not driving. Sleeping ok and reports good appetite. Reports that some foods cause her indigestion. Uses OTC antiacid.   Reports tremor in the upper extremities.  Son notices that with anxiety.  The patient states that she has a tremor every time she eats and sometimes with handwriting   10/08/20: Deborah Simon is a 71 year old female with a history of memory disturbance.  She returns  today to discuss her results from her neurocognitive evaluation.  I have reviewed these results with the patient.  Dr. Nicole Kindred have recommended further evaluation with LP and AD biomarkers.  The patient at this time does not want to proceed with any further work-up.  She remains on Namenda 10 mg twice a day.  She lives at home with her daughter in law and son.  She is able to complete all ADLs independently.  She continues to cook without difficulty.  She does not operate a motor vehicle.  Her daughter-in-law manages her medications and appointments.  She denies any trouble sleeping.  She returns today for an evaluation.  04/03/20: Deborah Simon is a 71 year old female with a history of intermittent tremor the returns today for follow-up.  Her main concern today is memory disturbance.  She states that she has some issues with word finding.  She lives at home with her son.  She is able to complete all ADLs independently.  She manages her own finances without difficulty.  She manages her own medications and appointments.  She does use a pillbox and references a calendar.  She is able to shop independently for her needs but the granddaughter reports that she does need a list.  No change in mood or behavior.  Patient returns today for an evaluation.  HISTORY Deborah Simon is a 71 year old right-handed woman with an underlying medical history of hypertension, hyperlipidemia, type 2 diabetes, and overweight state, who reports an intermittent right  hand tremor for the past 4 weeks or so.  Symptoms came on fairly suddenly.  They are primarily related to using her right hand and arm.  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  She has quite a bit of stress at work and is contemplating retiring.  She does better at home.  She gets tearful at times because of stress and trembling and it helps to calm her down by talking to her.  Her son has been helpful in that  regard and has been able to calm her down.  Rubbing her hands on her thighs helps calm her down.  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  She does not drink caffeine on a day-to-day basis, she does not drink alcohol and is a non-smoker.  She tries to hydrate well, drinks about 2 bottles of 16 ounce water per day at work and also water at home.  She lives with one of her 2 sons who are grown.  She has not had any treatment for anxiety or stress. I reviewed your office note from 03/11/2019, which you kindly included.  She had blood work in your office on 03/11/2019 and I reviewed the results: Lipid panel showed benign findings with a total cholesterol of 139, HDL 60, triglycerides 54 and LDL 66.  CMP showed glucose mildly elevated at 120, BUN 19, creatinine 1.14, otherwise normal findings, CBC with differential showed WBC of 6.5, hemoglobin 11.4 which is mildly low and hematocrit 33.7 which is also mildly low, platelets mildly elevated at 439, otherwise benign findings, A1c was mildly elevated at 6.6. She denies a family history of tremor or Parkinson's disease.  She has not fallen.  She has an eye examination once a year with Dr. Gershon Crane and is due for an appointment.  REVIEW OF SYSTEMS: Out of a complete 14 system review of symptoms, the patient complains only of the following symptoms, and all other reviewed systems are negative.  See HPI  ALLERGIES: Allergies  Allergen Reactions   Ace Inhibitors Swelling    Angioedema    Aricept [Donepezil]     headaches   Azithromycin Swelling   Lisinopril Swelling   Penicillins Other (See Comments)    Note  Sure rash of some sort probably, nothing airway related    HOME MEDICATIONS: Outpatient Medications Prior to Visit  Medication Sig Dispense Refill   albuterol  (VENTOLIN HFA) 108 (90 Base) MCG/ACT inhaler INHALE 1-2 PUFFS BY MOUTH EVERY 6 HOURS AS NEEDED FOR WHEEZE OR SHORTNESS OF BREATH 18 each 1   aspirin EC 81 MG tablet Take 81 mg by mouth daily.     atorvastatin (LIPITOR) 40 MG tablet Take 1 tablet (40 mg total) by mouth daily. 90 tablet 3   benzonatate (TESSALON PERLES) 100 MG capsule Take 1 capsule (100 mg total) by mouth every 6 (six) hours as needed for cough. 30 capsule 0   blood glucose meter kit and supplies Tests blood sugars once a day. E11.9 1 each 0   Budeson-Glycopyrrol-Formoterol (BREZTRI AEROSPHERE) 160-9-4.8 MCG/ACT AERO Inhale 2 puffs into the lungs in the morning and at bedtime. 10.7 g 0   cefUROXime (CEFTIN) 500 MG tablet Take 1 tablet (500 mg  total) by mouth 2 (two) times daily with a meal. 20 tablet 0   Cholecalciferol (VITAMIN D) 50 MCG (2000 UT) CAPS Take 1 capsule (2,000 Units total) by mouth daily. 90 capsule 3   citalopram (CELEXA) 20 MG tablet TAKE 1 TABLET BY MOUTH EVERY DAY 90 tablet 0   Famotidine (PEPCID PO) Take by mouth.     glipiZIDE (GLUCOTROL XL) 2.5 MG 24 hr tablet TAKE 1 TABLET BY MOUTH DAILY WITH BREAKFAST. 90 tablet 1   memantine (NAMENDA) 10 MG tablet TAKE 1 TABLET BY MOUTH TWICE A DAY 180 tablet 1   metFORMIN (GLUCOPHAGE) 500 MG tablet Take 1 tablet (500 mg total) by mouth 2 (two) times daily with a meal. 180 tablet 1   pantoprazole (PROTONIX) 40 MG tablet TAKE 1 TABLET DAILY 45 MIN PRIOR TO BREAKFAST 90 tablet 1   primidone (MYSOLINE) 50 MG tablet Take 0.5 tablets (25 mg total) by mouth at bedtime. 30 tablet 3   valsartan-hydrochlorothiazide (DIOVAN-HCT) 160-12.5 MG tablet TAKE 1 TABLET BY MOUTH EVERY DAY 90 tablet 0   No facility-administered medications prior to visit.    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   Diabetes mellitus    Fatty liver 07/12/2019   Hyperlipidemia    Hypertension     PAST SURGICAL  HISTORY: Past Surgical History:  Procedure Laterality Date   CHOLECYSTECTOMY  10/07/11   Single site   COLONOSCOPY  03/27/2008   Dr.Patterson-normal    FAMILY HISTORY: Family History  Problem Relation Age of Onset   Stroke Mother    Stroke Father    Aneurysm Son    Colon cancer Neg Hx    Colon polyps Neg Hx    Esophageal cancer Neg Hx    Stomach cancer Neg Hx    Rectal cancer Neg Hx     SOCIAL HISTORY: Social History   Socioeconomic History   Marital status: Single    Spouse name: Not on file   Number of children: 3   Years of education: Not on file   Highest education level: Not on file  Occupational History   Not on file  Tobacco Use   Smoking status: Never   Smokeless tobacco: Never  Vaping Use   Vaping Use: Never used  Substance and Sexual Activity   Alcohol use: No   Drug use: No   Sexual activity: Not Currently  Other Topics Concern   Not on file  Social History Narrative   Not on file   Social Determinants of Health   Financial Resource Strain: Low Risk  (05/24/2021)   Overall Financial Resource Strain (CARDIA)    Difficulty of Paying Living Expenses: Not hard at all  Food Insecurity: No Food Insecurity (05/24/2021)   Hunger Vital Sign    Worried About Running Out of Food in the Last Year: Never true    Ran Out of Food in the Last Year: Never true  Transportation Needs: No Transportation Needs (05/24/2021)   PRAPARE - Hydrologist (Medical): No    Lack of Transportation (Non-Medical): No  Physical Activity: Inactive (05/24/2021)   Exercise Vital Sign    Days of Exercise per Week: 0 days    Minutes of Exercise per Session: 0 min  Stress: No Stress Concern Present (05/24/2021)   [place]    Feeling of Stress : Not at all  Social Connections: Not on file  Intimate Partner Violence: Not on file      PHYSICAL EXAM  There were no vitals filed for this  visit.   There is no height or weight on file to calculate BMI.      11/18/2021   10:24 AM 10/08/2020   11:03 AM 04/03/2020   10:52 AM  MMSE - Mini Mental State Exam  Orientation to time _0 Orientation to Place _1 Registration _2 Attention/ Calculation 0 0 1  Recall 0 1 0  Language- name 2 objects _3 Language- repeat 1 0 1  Language- follow 3 step command _4 Language- read & follow direction _5 Write a sentence _6 Copy design 0 1 0  Total score _7 Generalized: Well developed, in no acute distress   Neurological examination  Mentation: Alert oriented to time, place, history taking. Follows all commands speech and language fluent Cranial nerve II-XII: Pupils were equal round reactive to light. Extraocular movements were full, visual field were full on confrontational test.  Head turning and shoulder shrug  were normal and symmetric. Motor: The motor testing reveals 5 over 5 strength of all 4 extremities. Good symmetric motor tone is noted throughout.  Sensory: Sensory testing is intact to soft touch on all 4 extremities. No evidence of extinction is noted.  Coordination: Cerebellar testing reveals good finger-nose-finger and heel-to-shin bilaterally. Mild tremor in the upper extremities with finger-nose-finger Gait and station: Gait is normal.  Reflexes: Deep tendon reflexes are symmetric and normal bilaterally.   DIAGNOSTIC DATA (LABS, IMAGING, TESTING) - I reviewed patient records, labs, notes, testing and imaging myself where available.  Lab Results  Component Value Date   WBC 7.9 03/25/2022   HGB 11.7 03/25/2022   HCT 34.4 03/25/2022   MCV 86 03/25/2022   PLT 416 03/25/2022      Component Value Date/Time   NA 139 03/25/2022 1117   K 3.6 03/25/2022 1117   CL 97 03/25/2022 1117   CO2 26 03/25/2022 1117   GLUCOSE 113 (H) 03/25/2022 1117   GLUCOSE 131 (H) 10/05/2019 0315   BUN 10 03/25/2022 1117   CREATININE 1.14 (H)  03/25/2022 1117   CREATININE 1.00 (H) 11/16/2015 1008   CALCIUM 9.7 03/25/2022 1117   PROT 7.3 03/25/2022 1117   ALBUMIN 4.8 03/25/2022 1117   AST 34 03/25/2022 1117   ALT 33 (H) 03/25/2022 1117   ALKPHOS 159 (H) 03/25/2022 1117   BILITOT 0.3 03/25/2022 1117   GFRNONAA 52 (L) 01/25/2020 1456   GFRNONAA 60 11/16/2015 1008   GFRAA 60 01/25/2020 1456   GFRAA 69 11/16/2015 1008   Lab Results  Component Value Date   CHOL 123 03/25/2022   HDL 50 03/25/2022   LDLCALC 58 03/25/2022   LDLDIRECT 131 (H) 09/01/2011   TRIG 73 03/25/2022   CHOLHDL 2.5 03/25/2022   Lab Results  Component Value Date   HGBA1C 7.6 (H) 03/25/2022   Lab Results  Component Value Date   VITAMINB12 680 09/28/2019   Lab Results  Component Value Date   TSH 0.991 03/25/2022      ASSESSMENT AND PLAN 71 y.o. year old female  has a past medical history of Degenerative joint disease (DJD) of lumbar spine (11/01/2019), Diabetes mellitus, Fatty liver (07/12/2019), Hyperlipidemia, and Hypertension. here with:  1.  Memory disturbance  -MMSE 13/30  previously 18 out of 30 -Continue Namenda 10 mg twice a day -Retry aricept 5 mg at bedtime  2.  Tremor  -Increase Primidone 50 mg at beditme    Advised if symptoms worsen or she develops new symptoms she should let us know. FU 6 months or sooner if needed    Ward Givens, MSN, NP-C 05/26/2022, 1:30 PM [facility] [address] [place], [place] [address] [phone]

## 2022-05-27 NOTE — Telephone Encounter (Signed)
Refill request last apt 04/17/22. 

## 2022-06-17 ENCOUNTER — Other Ambulatory Visit: Payer: Self-pay | Admitting: Adult Health

## 2022-06-17 ENCOUNTER — Other Ambulatory Visit: Payer: Self-pay | Admitting: Medical

## 2022-06-24 ENCOUNTER — Ambulatory Visit (INDEPENDENT_AMBULATORY_CARE_PROVIDER_SITE_OTHER): Payer: Medicare HMO

## 2022-06-24 VITALS — Ht 62.0 in | Wt 174.0 lb

## 2022-06-24 DIAGNOSIS — Z0000 Encounter for general adult medical examination without abnormal findings: Secondary | ICD-10-CM | POA: Diagnosis not present

## 2022-06-24 NOTE — Patient Instructions (Signed)
[name] , Thank you for taking time to come for your Medicare Wellness Visit. I appreciate your ongoing commitment to your health goals. Please review the following plan we discussed and let me know if I can assist you in the future.   These are the goals we discussed:  Goals      Patient Stated     05/24/2021, no goals     Patient Stated     06/24/2022, start exercising        This is a list of the screening recommended for you and due dates:  Health Maintenance  Topic Date Due   DTaP/Tdap/Td vaccine (2 - Tdap) 11/14/2013   Mammogram  12/03/2015   DEXA scan (bone density measurement)  Never done   Eye exam for diabetics  12/22/2015   Complete foot exam   10/04/2021   COVID-19 Vaccine (5 - 2023-24 season) 02/14/2022   Hemoglobin A1C  09/24/2022   Yearly kidney function blood test for diabetes  03/26/2023   Yearly kidney health urinalysis for diabetes  03/26/2023   Medicare Annual Wellness Visit  06/25/2023   Pneumonia Vaccine (3 - PPSV23 or PCV20) 10/04/2025   Colon Cancer Screening  07/09/2028   Flu Shot  Completed   Hepatitis C Screening: USPSTF Recommendation to screen - Ages 18-79 yo.  Completed   Zoster (Shingles) Vaccine  Completed   HPV Vaccine  Aged Out    Advanced directives: Advance directive discussed with you today.   Conditions/risks identified: none  Next appointment: Follow up in one year for your annual wellness visit    Preventive Care 65 Years and Older, Female Preventive care refers to lifestyle choices and visits with your health care provider that can promote health and wellness. What does preventive care include? A yearly physical exam. This is also called an annual well check. Dental exams once or twice a year. Routine eye exams. Ask your health care provider how often you should have your eyes checked. Personal lifestyle choices, including: Daily care of your teeth and gums. Regular physical activity. Eating a healthy diet. Avoiding tobacco  and drug use. Limiting alcohol use. Practicing safe sex. Taking low-dose aspirin every day. Taking vitamin and mineral supplements as recommended by your health care provider. What happens during an annual well check? The services and screenings done by your health care provider during your annual well check will depend on your age, overall health, lifestyle risk factors, and family history of disease. Counseling  Your health care provider may ask you questions about your: Alcohol use. Tobacco use. Drug use. Emotional well-being. Home and relationship well-being. Sexual activity. Eating habits. History of falls. Memory and ability to understand (cognition). Work and work Statistician. Reproductive health. Screening  You may have the following tests or measurements: Height, weight, and BMI. Blood pressure. Lipid and cholesterol levels. These may be checked every 5 years, or more frequently if you are over 61 years old. Skin check. Lung cancer screening. You may have this screening every year starting at age 68 if you have a 30-pack-year history of smoking and currently smoke or have quit within the past 15 years. Fecal occult blood test (FOBT) of the stool. You may have this test every year starting at age 67. Flexible sigmoidoscopy or colonoscopy. You may have a sigmoidoscopy every 5 years or a colonoscopy every 10 years starting at age 45. Hepatitis C blood test. Hepatitis B blood test. Sexually transmitted disease (STD) testing. Diabetes screening. This is done by checking  your blood sugar (glucose) after you have not eaten for a while (fasting). You may have this done every 1-3 years. Bone density scan. This is done to screen for osteoporosis. You may have this done starting at age 90. Mammogram. This may be done every 1-2 years. Talk to your health care provider about how often you should have regular mammograms. Talk with your health care provider about your test results,  treatment options, and if necessary, the need for more tests. Vaccines  Your health care provider may recommend certain vaccines, such as: Influenza vaccine. This is recommended every year. Tetanus, diphtheria, and acellular pertussis (Tdap, Td) vaccine. You may need a Td booster every 10 years. Zoster vaccine. You may need this after age 64. Pneumococcal 13-valent conjugate (PCV13) vaccine. One dose is recommended after age 65. Pneumococcal polysaccharide (PPSV23) vaccine. One dose is recommended after age 54. Talk to your health care provider about which screenings and vaccines you need and how often you need them. This information is not intended to replace advice given to you by your health care provider. Make sure you discuss any questions you have with your health care provider. Document Released: 06/29/2015 Document Revised: 02/20/2016 Document Reviewed: 04/03/2015 Elsevier Interactive Patient Education  2017 [place] Prevention in the Home Falls can cause injuries. They can happen to people of all ages. There are many things you can do to make your home safe and to help prevent falls. What can I do on the outside of my home? Regularly fix the edges of walkways and driveways and fix any cracks. Remove anything that might make you trip as you walk through a door, such as a raised step or threshold. Trim any bushes or trees on the path to your home. Use bright outdoor lighting. Clear any walking paths of anything that might make someone trip, such as rocks or tools. Regularly check to see if handrails are loose or broken. Make sure that both sides of any steps have handrails. Any raised decks and porches should have guardrails on the edges. Have any leaves, snow, or ice cleared regularly. Use sand or salt on walking paths during winter. Clean up any spills in your garage right away. This includes oil or grease spills. What can I do in the bathroom? Use night  lights. Install grab bars by the toilet and in the tub and shower. Do not use towel bars as grab bars. Use non-skid mats or decals in the tub or shower. If you need to sit down in the shower, use a plastic, non-slip stool. Keep the floor dry. Clean up any water that spills on the floor as soon as it happens. Remove soap buildup in the tub or shower regularly. Attach bath mats securely with double-sided non-slip rug tape. Do not have throw rugs and other things on the floor that can make you trip. What can I do in the bedroom? Use night lights. Make sure that you have a light by your bed that is easy to reach. Do not use any sheets or blankets that are too big for your bed. They should not hang down onto the floor. Have a firm chair that has side arms. You can use this for support while you get dressed. Do not have throw rugs and other things on the floor that can make you trip. What can I do in the kitchen? Clean up any spills right away. Avoid walking on wet floors. Keep items that you use a lot  in easy-to-reach places. If you need to reach something above you, use a strong step stool that has a grab bar. Keep electrical cords out of the way. Do not use floor polish or wax that makes floors slippery. If you must use wax, use non-skid floor wax. Do not have throw rugs and other things on the floor that can make you trip. What can I do with my stairs? Do not leave any items on the stairs. Make sure that there are handrails on both sides of the stairs and use them. Fix handrails that are broken or loose. Make sure that handrails are as long as the stairways. Check any carpeting to make sure that it is firmly attached to the stairs. Fix any carpet that is loose or worn. Avoid having throw rugs at the top or bottom of the stairs. If you do have throw rugs, attach them to the floor with carpet tape. Make sure that you have a light switch at the top of the stairs and the bottom of the stairs. If  you do not have them, ask someone to add them for you. What else can I do to help prevent falls? Wear shoes that: Do not have high heels. Have rubber bottoms. Are comfortable and fit you well. Are closed at the toe. Do not wear sandals. If you use a stepladder: Make sure that it is fully opened. Do not climb a closed stepladder. Make sure that both sides of the stepladder are locked into place. Ask someone to hold it for you, if possible. Clearly mark and make sure that you can see: Any grab bars or handrails. First and last steps. Where the edge of each step is. Use tools that help you move around (mobility aids) if they are needed. These include: Canes. Walkers. Scooters. Crutches. Turn on the lights when you go into a dark area. Replace any light bulbs as soon as they burn out. Set up your furniture so you have a clear path. Avoid moving your furniture around. If any of your floors are uneven, fix them. If there are any pets around you, be aware of where they are. Review your medicines with your doctor. Some medicines can make you feel dizzy. This can increase your chance of falling. Ask your doctor what other things that you can do to help prevent falls. This information is not intended to replace advice given to you by your health care provider. Make sure you discuss any questions you have with your health care provider. Document Released: 03/29/2009 Document Revised: 11/08/2015 Document Reviewed: 07/07/2014 Elsevier Interactive Patient Education  2017 [employer].

## 2022-06-24 NOTE — Progress Notes (Signed)
I connected with Hoover Brunette today by telephone and verified that I am speaking with the correct person using two identifiers. Location patient: home Location provider: work Persons participating in the virtual visit: Brendy Ficek, Jasmine December (daughter in law), Elisha Ponder LPN.   I discussed the limitations, risks, security and privacy concerns of performing an evaluation and management service by telephone and the availability of in person appointments. I also discussed with the patient that there may be a patient responsible charge related to this service. The patient expressed understanding and verbally consented to this telephonic visit.    Interactive audio and video telecommunications were attempted between this provider and patient, however failed, due to patient having technical difficulties OR patient did not have access to video capability.  We continued and completed visit with audio only.     Vital signs may be patient reported or missing.  Subjective:   Deborah Simon is a 72 y.o. female who presents for Medicare Annual (Subsequent) preventive examination.  Review of Systems     Cardiac Risk Factors include: advanced age (>10men, >50 women);diabetes mellitus;dyslipidemia;hypertension;obesity (BMI >30kg/m2)     Objective:    Today's Vitals   06/24/22 1526  Weight: 174 lb (78.9 kg)  Height: 5\' 2"  (1.575 m)   Body mass index is 31.83 kg/m.     06/24/2022    3:31 PM 05/24/2021    3:52 PM 10/05/2019    2:18 AM 08/03/2018    8:51 PM 02/01/2016   11:00 AM 11/16/2015    9:43 AM 04/17/2015    2:50 PM  Advanced Directives  Does Patient Have a Medical Advance Directive? No No No No No No No  Would patient like information on creating a medical advance directive?     No - patient declined information No - patient declined information No - patient declined information    Current Medications (verified) Outpatient Encounter Medications as of 06/24/2022  Medication Sig    albuterol (VENTOLIN HFA) 108 (90 Base) MCG/ACT inhaler INHALE 1-2 PUFFS BY MOUTH EVERY 6 HOURS AS NEEDED FOR WHEEZE OR SHORTNESS OF BREATH   aspirin EC 81 MG tablet Take 81 mg by mouth daily.   atorvastatin (LIPITOR) 40 MG tablet Take 1 tablet (40 mg total) by mouth daily.   benzonatate (TESSALON PERLES) 100 MG capsule Take 1 capsule (100 mg total) by mouth every 6 (six) hours as needed for cough.   Blood Glucose Monitoring Suppl (ACCU-CHEK GUIDE ME) w/Device KIT TESTS BLOOD SUGARS ONCE A DAY. E11.9   Budeson-Glycopyrrol-Formoterol (BREZTRI AEROSPHERE) 160-9-4.8 MCG/ACT AERO Inhale 2 puffs into the lungs in the morning and at bedtime.   Cetirizine HCl (ZYRTEC PO) Take by mouth.   Cholecalciferol (VITAMIN D) 50 MCG (2000 UT) CAPS Take 1 capsule (2,000 Units total) by mouth daily.   citalopram (CELEXA) 20 MG tablet TAKE 1 TABLET BY MOUTH EVERY DAY   donepezil (ARICEPT) 5 MG tablet Take 1 tablet (5 mg total) by mouth at bedtime.   Famotidine (PEPCID PO) Take by mouth.   glipiZIDE (GLUCOTROL XL) 2.5 MG 24 hr tablet TAKE 1 TABLET BY MOUTH DAILY WITH BREAKFAST.   memantine (NAMENDA) 10 MG tablet TAKE 1 TABLET BY MOUTH TWICE A DAY   metFORMIN (GLUCOPHAGE) 500 MG tablet Take 1 tablet (500 mg total) by mouth 2 (two) times daily with a meal. (Patient taking differently: Take 250 mg by mouth 2 (two) times daily with a meal.)   Multiple Vitamins-Minerals (MULTIVITAMIN WOMEN 50+ PO) Take 1 tablet by mouth  daily.   pantoprazole (PROTONIX) 40 MG tablet TAKE 1 TABLET DAILY 45 MIN PRIOR TO BREAKFAST   primidone (MYSOLINE) 50 MG tablet Take 1 tablet (50 mg total) by mouth at bedtime.   valsartan-hydrochlorothiazide (DIOVAN-HCT) 160-12.5 MG tablet TAKE 1 TABLET BY MOUTH EVERY DAY   cefUROXime (CEFTIN) 500 MG tablet Take 1 tablet (500 mg total) by mouth 2 (two) times daily with a meal. (Patient not taking: Reported on 05/26/2022)   No facility-administered encounter medications on file as of 06/24/2022.     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   Diabetes mellitus    Fatty liver 07/12/2019   Hyperlipidemia    Hypertension    Past Surgical History:  Procedure Laterality Date   CHOLECYSTECTOMY  10/07/11   Single site   COLONOSCOPY  03/27/2008   Dr.Patterson-normal   Family History  Problem Relation Age of Onset   Stroke Mother    Stroke Father    Aneurysm Son    Colon cancer Neg Hx    Colon polyps Neg Hx    Esophageal cancer Neg Hx    Stomach cancer Neg Hx    Rectal cancer Neg Hx    Social History   Socioeconomic History   Marital status: Single    Spouse name: Not on file   Number of children: 3   Years of education: Not on file   Highest education level: High school graduate  Occupational History   Not on file  Tobacco Use   Smoking status: Never   Smokeless tobacco: Never  Vaping Use   Vaping Use: Never used  Substance and Sexual Activity   Alcohol use: No   Drug use: No   Sexual activity: Not Currently  Other Topics Concern   Not on file  Social History Narrative   Lives with son & daughter in law   Right handed   Caffeine: none   Social Determinants of Health   Financial Resource Strain: Low Risk  (06/24/2022)   Overall Financial Resource Strain (CARDIA)    Difficulty of Paying Living Expenses: Not hard at all  Food Insecurity: No Food Insecurity (06/24/2022)   Hunger Vital Sign    Worried About Running Out of Food in the Last Year: Never true    Ran Out of Food in the Last Year: Never true  Transportation Needs: No Transportation Needs (06/24/2022)   PRAPARE - Administrator, Civil Service (Medical): No    Lack of Transportation (Non-Medical): No  Physical Activity: Inactive (06/24/2022)   Exercise Vital Sign    Days of Exercise per Week: 0 days    Minutes of Exercise  per Session: 0 min  Stress: No Stress Concern Present (06/24/2022)   [employer] of Occupational Health - Occupational Stress Questionnaire    Feeling of Stress : Not at all  Social Connections: Not on file    Tobacco Counseling Counseling given: Not Answered   Clinical Intake:  Pre-visit preparation completed: Yes  Pain : No/denies pain     Nutritional Status: BMI > 30  Obese Nutritional Risks: None Diabetes: Yes  How often do you need to have someone help you when you read instructions, pamphlets, or other written materials from your doctor or pharmacy?: 4 - Often  Diabetic? Yes Nutrition Risk Assessment:  Has the patient had any N/V/D within the last 2 months?  No  Does the patient have any non-healing wounds?  No  Has the patient had any unintentional weight loss or weight gain?  No   Diabetes:  Is the patient diabetic?  Yes  If diabetic, was a CBG obtained today?  No  Did the patient bring in their glucometer from home?  No  How often do you monitor your CBG's? 2-3 weekly.   Financial Strains and Diabetes Management:  Are you having any financial strains with the device, your supplies or your medication? No .  Does the patient want to be seen by Chronic Care Management for management of their diabetes?  No  Would the patient like to be referred to a Nutritionist or for Diabetic Management?  No   Diabetic Exams:  Diabetic Eye Exam: Overdue for diabetic eye exam. Pt has been advised about the importance in completing this exam. Patient advised to call and schedule an eye exam. Diabetic Foot Exam: Overdue, Pt has been advised about the importance in completing this exam. Pt is scheduled for diabetic foot exam on next appointment.   Interpreter Needed?: No  Information entered by :: NAllen LPN   Activities of Daily Living    06/24/2022    3:33 PM  In your present state of health, do you have any difficulty performing the following activities:  Hearing?  0  Vision? 1  Difficulty concentrating or making decisions? 1  Walking or climbing stairs? 0  Dressing or bathing? 0  Doing errands, shopping? 1  Preparing Food and eating ? N  Using the Toilet? N  In the past six months, have you accidently leaked urine? Y  Comment with coughing fits  Do you have problems with loss of bowel control? N  Managing your Medications? Y  Managing your Finances? Y  Housekeeping or managing your Housekeeping? N    Patient Care Team: Tysinger, Kermit Balo, PA-C as PCP - General (Family Medicine)  Indicate any recent Medical Services you may have received from other than [facility] providers in the past year (date may be approximate).     Assessment:   This is a routine wellness examination for Deborah Simon.  Hearing/Vision screen Vision Screening - Comments:: No regular eye exams,   Dietary issues and exercise activities discussed: Current Exercise Habits: The patient does not participate in regular exercise at present   Goals Addressed             This Visit's Progress    Patient Stated       06/24/2022, start exercising       Depression Screen    06/24/2022    3:33 PM 05/24/2021    3:53 PM 10/04/2020   11:26 AM 02/01/2016   11:00 AM 11/16/2015    9:44 AM 04/17/2015    2:50 PM 09/27/2014    3:51 PM  PHQ 2/9 Scores  PHQ - 2 Score 0 0 0 0 0 0 0  PHQ- 9 Score 0          Fall Risk    06/24/2022    3:32 PM 05/24/2021    3:53 PM 10/04/2020   11:26 AM 03/31/2019    9:44 AM 01/14/2018    9:44 AM  Fall Risk   Falls in the past year? 0 0 0 0 No  Comment     Emmi Telephone Survey: data to providers prior to load  Number falls in past yr: 0  0 0   Injury with Fall? 0  0 0  Risk for fall due to : Medication side effect Medication side effect No Fall Risks Medication side effect   Follow up Falls prevention discussed;Education provided;Falls evaluation completed Falls evaluation completed;Education provided;Falls prevention discussed Falls evaluation completed  Falls evaluation completed     FALL RISK PREVENTION PERTAINING TO THE HOME:  Any stairs in or around the home? Yes  If so, are there any without handrails? No  Home free of loose throw rugs in walkways, pet beds, electrical cords, etc? Yes  Adequate lighting in your home to reduce risk of falls? Yes   ASSISTIVE DEVICES UTILIZED TO PREVENT FALLS:  Life alert? No  Use of a cane, walker or w/c? No  Grab bars in the bathroom? Yes  Shower chair or bench in shower? No  Elevated toilet seat or a handicapped toilet? No   TIMED UP AND GO:  Was the test performed? No .      Cognitive Function:  6 CIT not administered. Patient has diagnosis of dementia. She is followed by neurology.      05/26/2022    1:37 PM 11/18/2021   10:24 AM 10/08/2020   11:03 AM 04/03/2020   10:52 AM 09/28/2019   11:32 AM  MMSE - Mini Mental State Exam  Orientation to time 0 2 2 3 4   Orientation to Place 4 5 5 5 5   Registration 3 3 3 3 3   Attention/ Calculation 0 0 0 1 4  Recall 0 0 1 0 0  Language- name 2 objects 2 2 2 2 2   Language- repeat 0 1 0 1 1  Language- follow 3 step command 3 3 3 3 2   Language- read & follow direction 1 1 1 1 1   Write a sentence 0 1 1 1 1   Copy design 0 0 1 0 0  Copy design-comments     5 animals  Total score 13 18 19 20 23         Immunizations Immunization History  Administered Date(s) Administered   Fluad Quad(high Dose 65+) 04/29/2021, 03/25/2022   Influenza Split 05/12/2011, 04/01/2012   Influenza Whole 03/10/2008   Influenza, High Dose Seasonal PF 03/18/2018   Influenza,inj,Quad PF,6+ Mos 04/04/2014   Influenza-Unspecified 04/12/2015   Moderna Covid-19 Vaccine Bivalent Booster 84yrs & up 04/29/2021   Moderna Sars-Covid-2 Vaccination 08/26/2019, 09/26/2019, 06/26/2020   Pneumococcal Conjugate-13 10/04/2020   Pneumococcal Polysaccharide-23 10/29/2012   Td 11/15/2003   Zoster Recombinat (Shingrix) 05/22/2017, 01/12/2019    TDAP status: Up to date  Flu  Vaccine status: Up to date  Pneumococcal vaccine status: Up to date  Covid-19 vaccine status: Completed vaccines  Qualifies for Shingles Vaccine? Yes   Zostavax completed Yes   Shingrix Completed?: Yes  Screening Tests Health Maintenance  Topic Date Due   DTaP/Tdap/Td (2 - Tdap) 11/14/2013   MAMMOGRAM  12/03/2015   DEXA SCAN  Never done   OPHTHALMOLOGY EXAM  12/22/2015   FOOT EXAM  10/04/2021   COVID-19 Vaccine (5 - 2023-24 season) 02/14/2022   Medicare Annual Wellness (AWV)  05/24/2022   HEMOGLOBIN A1C  09/24/2022   Diabetic kidney evaluation - eGFR measurement  03/26/2023   Diabetic kidney evaluation - Urine ACR  03/26/2023   Pneumonia Vaccine 24+ Years old (3 - PPSV23 or PCV20) 10/04/2025   COLONOSCOPY (Pts 45-74yrs Insurance coverage will need to be confirmed)  07/09/2028   INFLUENZA VACCINE  Completed   Hepatitis C Screening  Completed   Zoster Vaccines- Shingrix  Completed   HPV VACCINES  Aged Out    Health Maintenance  Health Maintenance Due  Topic Date Due   DTaP/Tdap/Td (2 - Tdap) 11/14/2013   MAMMOGRAM  12/03/2015   DEXA SCAN  Never done   OPHTHALMOLOGY EXAM  12/22/2015   FOOT EXAM  10/04/2021   COVID-19 Vaccine (5 - 2023-24 season) 02/14/2022   Medicare Annual Wellness (AWV)  05/24/2022    Colorectal cancer screening: Type of screening: Colonoscopy. Completed 07/09/2018. Repeat every 10 years  Mammogram status: due  Bone Density status: due  Lung Cancer Screening: (Low Dose CT Chest recommended if Age 90-80 years, 30 pack-year currently smoking OR have quit w/in 15years.) does not qualify.   Lung Cancer Screening Referral: no  Additional Screening:  Hepatitis C Screening: does qualify; Completed 05/18/2015  Vision Screening: Recommended annual ophthalmology exams for early detection of glaucoma and other disorders of the eye. Is the patient up to date with their annual eye exam?  No  Who is the provider or what is the name of the office in which  the patient attends annual eye exams? [facility] If pt is not established with a provider, would they like to be referred to a provider to establish care? No .   Dental Screening: Recommended annual dental exams for proper oral hygiene  Community Resource Referral / Chronic Care Management: CRR required this visit?  No   CCM required this visit?  No      Plan:     I have personally reviewed and noted the following in the patient's chart:   Medical and social history Use of alcohol, tobacco or illicit drugs  Current medications and supplements including opioid prescriptions. Patient is not currently taking opioid prescriptions. Functional ability and status Nutritional status Physical activity Advanced directives List of other physicians Hospitalizations, surgeries, and ER visits in previous 12 months Vitals Screenings to include cognitive, depression, and falls Referrals and appointments  In addition, I have reviewed and discussed with patient certain preventive protocols, quality metrics, and best practice recommendations. A written personalized care plan for preventive services as well as general preventive health recommendations were provided to patient.     Barb Merino, LPN   12/19/4490   Nurse Notes: none  Due to this being a virtual visit, the after visit summary with patients personalized plan was offered to patient via mail or my-chart. Patient would like to access on my-chart

## 2022-07-22 ENCOUNTER — Other Ambulatory Visit: Payer: Self-pay | Admitting: Medical

## 2022-08-09 ENCOUNTER — Other Ambulatory Visit: Payer: Self-pay | Admitting: Medical

## 2022-08-11 NOTE — Telephone Encounter (Signed)
Refill request last apt 04/17/22.

## 2022-09-24 ENCOUNTER — Other Ambulatory Visit: Payer: Self-pay | Admitting: Medical

## 2022-09-24 DIAGNOSIS — E119 Type 2 diabetes mellitus without complications: Secondary | ICD-10-CM

## 2022-09-24 NOTE — Telephone Encounter (Signed)
Left message for pt to call back to schedule an appointment in may for 6 month follow-up

## 2022-10-18 ENCOUNTER — Other Ambulatory Visit: Payer: Self-pay | Admitting: Medical

## 2022-10-28 ENCOUNTER — Other Ambulatory Visit: Payer: Self-pay | Admitting: Medical

## 2022-10-29 ENCOUNTER — Other Ambulatory Visit: Payer: Self-pay | Admitting: Medical

## 2022-10-29 NOTE — Telephone Encounter (Signed)
Refill request last apt 03/25/22. 

## 2022-11-21 ENCOUNTER — Other Ambulatory Visit: Payer: Self-pay | Admitting: Adult Health

## 2022-11-21 ENCOUNTER — Encounter: Payer: Self-pay | Admitting: Internal Medicine

## 2022-11-21 ENCOUNTER — Other Ambulatory Visit: Payer: Self-pay | Admitting: Medical

## 2022-11-25 ENCOUNTER — Other Ambulatory Visit: Payer: Self-pay | Admitting: Medical

## 2022-11-25 DIAGNOSIS — E119 Type 2 diabetes mellitus without complications: Secondary | ICD-10-CM

## 2022-11-29 ENCOUNTER — Other Ambulatory Visit: Payer: Self-pay | Admitting: Medical

## 2022-11-29 DIAGNOSIS — E119 Type 2 diabetes mellitus without complications: Secondary | ICD-10-CM

## 2022-12-08 ENCOUNTER — Encounter: Payer: Self-pay | Admitting: Adult Health

## 2022-12-08 ENCOUNTER — Ambulatory Visit: Payer: Medicare HMO | Admitting: Adult Health

## 2022-12-08 VITALS — BP 141/74 | HR 79 | Ht 64.0 in | Wt 180.0 lb

## 2022-12-08 DIAGNOSIS — F0390 Unspecified dementia without behavioral disturbance: Secondary | ICD-10-CM | POA: Diagnosis not present

## 2022-12-08 DIAGNOSIS — R251 Tremor, unspecified: Secondary | ICD-10-CM | POA: Diagnosis not present

## 2022-12-08 NOTE — Patient Instructions (Signed)
Your Plan:  Continue Aricept and Namenda Continue Primidone for tremor If your symptoms worsen or you develop new symptoms please let us know.    Thank you for coming to see [place] at [facility]. I hope we have been able to provide you high quality care today.  You may receive a patient satisfaction survey over the next few weeks. We would appreciate your feedback and comments so that we may continue to improve ourselves and the health of our patients.

## 2022-12-08 NOTE — Progress Notes (Signed)
PATIENT: Deborah Simon DOB: 01/12/1951  REASON FOR VISIT: follow up HISTORY FROM: patient Chief Complaint  Patient presents with   Rm 4    Patient is here with her daughter in law and her sister (visiting from [facility]). She feels she is doing ok. Her daughter in law states she is struggling more since the last visit. She will forget she has eaten and then she will eat again. She is forgetting things  more and gets frustrated, especially in the evenings. She still sleeps ok but she has a hard time getting to sleep due to either something on her mind or a coughing spell. She struggles with picking out clothes and gets upset if she can't decide.     HISTORY OF PRESENT ILLNESS: Today 12/08/22:  Deborah Simon is a 72 y.o. female with a history of memory disturbance and essential tremor. Returns today for follow-up.  She is here today with her sister and daughter-in-law.  Daughter-in-law feels that her memory is worse.  She requires prompting with all ADLs.  She lives with her son and daughter-in-law.  Daughter-in-law manages her finances appointments and medications.  At the last visit we restarted Aricept.  She has been tolerating this well.  No headaches.  Her sister reports some loose stools but this was going on before she started Aricept.     05/26/22: Deborah Simon is a 72 y.o. female with a history of memory disturbance and essential tremor.  She returns today for follow-up.  She continues to live with her son and daughter-in-law.  She is able to complete all ADLs independently.  Her son helps her with her medications ,, appointments and finances.  She does not operate a motor vehicle.  Denies any trouble sleeping.  At the last visit we did order primidone 25 mg at bedtime for essential tremor.  Daughter in law has not seen a big improvement in the tremors. Daughter in law would like the patient to retry Aricept as she is not convinced that mediation caused her headaches. Patient agrees.     11/18/21: Deborah Simon is a 72 year old female with a history of memory disturbance.  She returns today for follow-up. Feels that memory is the same. Reports that she is forgetful with short term memory. Lives with her son. Able to complete all ADLs independently. Son helps with her medications, appointments and finances. Not driving. Sleeping ok and reports good appetite. Reports that some foods cause her indigestion. Uses OTC antiacid.   Reports tremor in the upper extremities.  Son notices that with anxiety.  The patient states that she has a tremor every time she eats and sometimes with handwriting   10/08/20: Deborah Simon is a 72 year old female with a history of memory disturbance.  She returns today to discuss her results from her neurocognitive evaluation.  I have reviewed these results with the patient.  Dr. Roseanne Reno have recommended further evaluation with LP and AD biomarkers.  The patient at this time does not want to proceed with any further work-up.  She remains on Namenda 10 mg twice a day.  She lives at home with her daughter in law and son.  She is able to complete all ADLs independently.  She continues to cook without difficulty.  She does not operate a motor vehicle.  Her daughter-in-law manages her medications and appointments.  She denies any trouble sleeping.  She returns today for an evaluation.  04/03/20: Deborah Simon is a 72 year old female with a history of intermittent  tremor the returns today for follow-up.  Her main concern today is memory disturbance.  She states that she has some issues with word finding.  She lives at home with her son.  She is able to complete all ADLs independently.  She manages her own finances without difficulty.  She manages her own medications and appointments.  She does use a pillbox and references a calendar.  She is able to shop independently for her needs but the granddaughter reports that she does need a list.  No change in mood or behavior.  Patient  returns today for an evaluation.  HISTORY Deborah Simon is a 72 year old right-handed woman with an underlying medical history of hypertension, hyperlipidemia, type 2 diabetes, and overweight state, who reports an intermittent right hand tremor for the past 4 weeks or so.  Symptoms came on fairly suddenly.  They are primarily related to using her right hand and arm.  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  She has quite a bit of stress at work and is contemplating retiring.  She does better at home.  She gets tearful at times because of stress and trembling and it helps to calm her down by talking to her.  Her son has been helpful in that regard and has been able to calm her down.  Rubbing her hands on her thighs helps calm her down.  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  She does not drink caffeine on a day-to-day basis, she does not drink alcohol and is a non-smoker.  She tries to hydrate well, drinks about 2 bottles of 16 ounce water per day at work and also water at home.  She lives with one of her 2 sons who are grown.  She has not had any treatment for anxiety or stress. I reviewed your office note from 03/11/2019, which you kindly included.  She had blood work in your office on 03/11/2019 and I reviewed the results: Lipid panel showed benign findings with a total cholesterol of 139, HDL 60, triglycerides 54 and LDL 66.  CMP showed glucose mildly elevated at 120, BUN 19, creatinine 1.14, otherwise normal findings, CBC with differential showed WBC of 6.5, hemoglobin 11.4 which is mildly low and hematocrit 33.7 which is also mildly low, platelets mildly elevated at 439, otherwise benign findings, A1c was mildly elevated at 6.6. She denies a family  history of tremor or Parkinson's disease.  She has not fallen.  She has an eye examination once a year with Dr. Nile Riggs and is due for an appointment.  REVIEW OF SYSTEMS: Out of a complete 14 system review of symptoms, the patient complains only of the following symptoms, and all other reviewed systems are negative.  See HPI  ALLERGIES: Allergies  Allergen Reactions   Ace Inhibitors Swelling    Angioedema    Aricept [Donepezil]     headaches   Azithromycin Swelling   Lisinopril Swelling   Penicillins Other (See Comments)    Note  Sure rash of some sort probably, nothing airway related    HOME MEDICATIONS: Outpatient Medications Prior to Visit  Medication Sig Dispense Refill   albuterol (VENTOLIN HFA) 108 (90 Base) MCG/ACT inhaler INHALE 1-2 PUFFS  BY MOUTH EVERY 6 HOURS AS NEEDED FOR WHEEZE OR SHORTNESS OF BREATH 18 each 1   aspirin EC 81 MG tablet Take 81 mg by mouth daily.     atorvastatin (LIPITOR) 40 MG tablet Take 1 tablet (40 mg total) by mouth daily. 90 tablet 3   benzonatate (TESSALON PERLES) 100 MG capsule Take 1 capsule (100 mg total) by mouth every 6 (six) hours as needed for cough. 30 capsule 0   Blood Glucose Monitoring Suppl (ACCU-CHEK GUIDE ME) w/Device KIT TESTS BLOOD SUGARS ONCE A DAY. E11.9 1 kit 0   BREZTRI AEROSPHERE 160-9-4.8 MCG/ACT AERO INHALE 2 PUFFS INTO THE LUNGS IN THE MORNING AND AT BEDTIME. 10.7 each 0   cefUROXime (CEFTIN) 500 MG tablet Take 1 tablet (500 mg total) by mouth 2 (two) times daily with a meal. (Patient not taking: Reported on 05/26/2022) 20 tablet 0   Cetirizine HCl (ZYRTEC PO) Take by mouth.     Cholecalciferol (VITAMIN D) 50 MCG (2000 UT) CAPS Take 1 capsule (2,000 Units total) by mouth daily. 90 capsule 3   citalopram (CELEXA) 20 MG tablet TAKE 1 TABLET BY MOUTH EVERY DAY 90 tablet 0   donepezil (ARICEPT) 5 MG tablet Take 1 tablet (5 mg total) by mouth at bedtime. 30 tablet 5   Famotidine (PEPCID PO) Take by mouth.     glipiZIDE  (GLUCOTROL XL) 2.5 MG 24 hr tablet TAKE 1 TABLET BY MOUTH EVERY DAY WITH BREAKFAST 90 tablet 1   memantine (NAMENDA) 10 MG tablet TAKE 1 TABLET BY MOUTH TWICE A DAY 180 tablet 1   metFORMIN (GLUCOPHAGE) 500 MG tablet TAKE 0.5 TABLETS (250 MG TOTAL) BY MOUTH 2 (TWO) TIMES DAILY WITH A MEAL. 60 tablet 0   Multiple Vitamins-Minerals (MULTIVITAMIN WOMEN 50+ PO) Take 1 tablet by mouth daily.     pantoprazole (PROTONIX) 40 MG tablet TAKE 1 TABLET DAILY 45 MIN PRIOR TO BREAKFAST 90 tablet 1   primidone (MYSOLINE) 50 MG tablet TAKE 1 TABLET BY MOUTH EVERYDAY AT BEDTIME 90 tablet 2   valsartan-hydrochlorothiazide (DIOVAN-HCT) 160-12.5 MG tablet TAKE 1 TABLET BY MOUTH EVERY DAY 90 tablet 1   No facility-administered medications prior to visit.    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   Diabetes mellitus    Fatty liver 07/12/2019   Hyperlipidemia    Hypertension     PAST SURGICAL HISTORY: Past Surgical History:  Procedure Laterality Date   CHOLECYSTECTOMY  10/07/11   Single site   COLONOSCOPY  03/27/2008   Dr.Patterson-normal    FAMILY HISTORY: Family History  Problem Relation Age of Onset   Stroke Mother    Stroke Father    Aneurysm Son    Colon cancer Neg Hx    Colon polyps Neg Hx    Esophageal cancer Neg Hx    Stomach cancer Neg Hx    Rectal cancer Neg Hx     SOCIAL HISTORY: Social History   Socioeconomic History   Marital status: Single    Spouse name: Not on file   Number of children: 3   Years of education: Not on file   Highest education level: High school graduate  Occupational History   Not on file  Tobacco Use   Smoking status: Never   Smokeless tobacco: Never  Vaping Use   Vaping Use: Never used  Substance and Sexual Activity   Alcohol use: No   Drug use: No  Sexual activity: Not Currently  Other Topics Concern   Not on file  Social History Narrative   Lives with son &  daughter in law   Right handed   Caffeine: none   Social Determinants of Health   Financial Resource Strain: Low Risk  (06/24/2022)   Overall Financial Resource Strain (CARDIA)    Difficulty of Paying Living Expenses: Not hard at all  Food Insecurity: No Food Insecurity (06/24/2022)   Hunger Vital Sign    Worried About Running Out of Food in the Last Year: Never true    Ran Out of Food in the Last Year: Never true  Transportation Needs: No Transportation Needs (06/24/2022)   PRAPARE - Administrator, Civil Service (Medical): No    Lack of Transportation (Non-Medical): No  Physical Activity: Inactive (06/24/2022)   Exercise Vital Sign    Days of Exercise per Week: 0 days    Minutes of Exercise per Session: 0 min  Stress: No Stress Concern Present (06/24/2022)   [employer] of Occupational Health - Occupational Stress Questionnaire    Feeling of Stress : Not at all  Social Connections: Not on file  Intimate Partner Violence: Not on file      PHYSICAL EXAM  Vitals:   12/08/22 1337  BP: (!) 141/74  Pulse: 79  Weight: 180 lb (81.6 kg)  Height: 5\' 4"  (1.626 m)     Body mass index is 30.9 kg/m.      12/08/2022    1:42 PM 05/26/2022    1:37 PM 11/18/2021   10:24 AM  MMSE - Mini Mental State Exam  Orientation to time 1 0 2  Orientation to Place 2 4 5   Registration 3 3 3   Attention/ Calculation 0 0 0  Recall 0 0 0  Language- name 2 objects 2 2 2   Language- repeat 0 0 1  Language- follow 3 step command 3 3 3   Language- read & follow direction 1 1 1   Write a sentence 1 0 1  Copy design 0 0 0  Total score 13 13 18       Generalized: Well developed, in no acute distress   Neurological examination  Mentation: Alert oriented to time, place, history taking. Follows all commands speech and language fluent Cranial nerve II-XII: Pupils were equal round reactive to light. Extraocular movements were full, visual field were full on confrontational test.  Head turning  and shoulder shrug  were normal and symmetric. Motor: The motor testing reveals 5 over 5 strength of all 4 extremities. Good symmetric motor tone is noted throughout.  Sensory: Sensory testing is intact to soft touch on all 4 extremities. No evidence of extinction is noted.  Coordination: Cerebellar testing reveals good finger-nose-finger and heel-to-shin bilaterally. Mild tremor in the upper extremities with finger-nose-finger Gait and station: Gait is normal.    DIAGNOSTIC DATA (LABS, IMAGING, TESTING) - I reviewed patient records, labs, notes, testing and imaging myself where available.  Lab Results  Component Value Date   WBC 7.9 03/25/2022   HGB 11.7 03/25/2022   HCT 34.4 03/25/2022   MCV 86 03/25/2022   PLT 416 03/25/2022      Component Value Date/Time   NA 139 03/25/2022 1117   K 3.6 03/25/2022 1117   CL 97 03/25/2022 1117   CO2 26 03/25/2022 1117   GLUCOSE 113 (H) 03/25/2022 1117   GLUCOSE 131 (H) 10/05/2019 0315   BUN 10 03/25/2022 1117   CREATININE 1.14 (H) 03/25/2022 1117  CREATININE 1.00 (H) 11/16/2015 1008   CALCIUM 9.7 03/25/2022 1117   PROT 7.3 03/25/2022 1117   ALBUMIN 4.8 03/25/2022 1117   AST 34 03/25/2022 1117   ALT 33 (H) 03/25/2022 1117   ALKPHOS 159 (H) 03/25/2022 1117   BILITOT 0.3 03/25/2022 1117   GFRNONAA 52 (L) 01/25/2020 1456   GFRNONAA 60 11/16/2015 1008   GFRAA 60 01/25/2020 1456   GFRAA 69 11/16/2015 1008   Lab Results  Component Value Date   CHOL 123 03/25/2022   HDL 50 03/25/2022   LDLCALC 58 03/25/2022   LDLDIRECT 131 (H) 09/01/2011   TRIG 73 03/25/2022   CHOLHDL 2.5 03/25/2022   Lab Results  Component Value Date   HGBA1C 7.6 (H) 03/25/2022   Lab Results  Component Value Date   VITAMINB12 680 09/28/2019   Lab Results  Component Value Date   TSH 0.991 03/25/2022      ASSESSMENT AND PLAN 72 y.o. year old female  has a past medical history of Degenerative joint disease (DJD) of lumbar spine (11/01/2019), Diabetes  mellitus, Fatty liver (07/12/2019), Hyperlipidemia, and Hypertension. here with:  1.  Memory disturbance  -MMSE 13/30 previously 13 out of 30 -Continue Namenda 10 mg twice a day -Continue Aricept 5 mg at bedtime  2.  Tremor  -Continue Primidone 50 mg at beditme    Advised if symptoms worsen or she develops new symptoms she should let us know. FU 6 months or sooner if needed    Butch Penny, MSN, NP-C 12/08/2022, 1:23 PM [facility] [address], [address] [place], [place] [address] [phone]

## 2022-12-21 ENCOUNTER — Other Ambulatory Visit: Payer: Self-pay | Admitting: Medical

## 2022-12-21 DIAGNOSIS — E119 Type 2 diabetes mellitus without complications: Secondary | ICD-10-CM

## 2022-12-23 ENCOUNTER — Encounter: Payer: Self-pay | Admitting: *Deleted

## 2022-12-23 NOTE — Progress Notes (Signed)
[facility] Quality Team Note  Name: Deborah Simon Date of Birth: [birth year] MRN: [id number] Date: 12/23/2022  [facility] Quality Team has reviewed this patient's chart, please see recommendations below:  [facility] Quality Other; Pt has open gaps for mammogram, blood pressure check (last one out of range), eye screening, A1C, EGFR, and Urine Albumin Creatinine Ratio Test.  Would gaps be able to be addressed at ov on 12/25/22?  I tried to call pt but had to leave a voice mail.

## 2022-12-25 ENCOUNTER — Ambulatory Visit (INDEPENDENT_AMBULATORY_CARE_PROVIDER_SITE_OTHER): Payer: Medicare HMO | Admitting: Medical

## 2022-12-25 VITALS — BP 120/70 | HR 78 | Wt 175.4 lb

## 2022-12-25 DIAGNOSIS — R748 Abnormal levels of other serum enzymes: Secondary | ICD-10-CM

## 2022-12-25 DIAGNOSIS — Z780 Asymptomatic menopausal state: Secondary | ICD-10-CM

## 2022-12-25 DIAGNOSIS — Z1231 Encounter for screening mammogram for malignant neoplasm of breast: Secondary | ICD-10-CM | POA: Diagnosis not present

## 2022-12-25 DIAGNOSIS — R059 Cough, unspecified: Secondary | ICD-10-CM | POA: Diagnosis not present

## 2022-12-25 DIAGNOSIS — Z7185 Encounter for immunization safety counseling: Secondary | ICD-10-CM | POA: Diagnosis not present

## 2022-12-25 DIAGNOSIS — E1122 Type 2 diabetes mellitus with diabetic chronic kidney disease: Secondary | ICD-10-CM | POA: Diagnosis not present

## 2022-12-25 LAB — POCT GLYCOSYLATED HEMOGLOBIN (HGB A1C): Hemoglobin A1C: 7.8 % — AB (ref 4.0–5.6)

## 2022-12-25 MED ORDER — ALBUTEROL SULFATE HFA 108 (90 BASE) MCG/ACT IN AERS
INHALATION_SPRAY | RESPIRATORY_TRACT | 1 refills | Status: DC
Start: 2022-12-25 — End: 2023-04-16

## 2022-12-25 MED ORDER — SITAGLIPTIN PHOSPHATE 50 MG PO TABS: 50.0000 mg | ORAL_TABLET | Freq: Every day | ORAL | 0 refills | Status: DC

## 2022-12-25 MED ORDER — BREZTRI AEROSPHERE 160-9-4.8 MCG/ACT IN AERO: 2.0000 | INHALATION_SPRAY | Freq: Two times a day (BID) | RESPIRATORY_TRACT | 5 refills | Status: DC

## 2022-12-25 NOTE — Patient Instructions (Addendum)
Recommendations: Try and get appointment with your eye doctor soon for yearly eye and diabetic eye exam.  Make sure they end [place] a copy of the note.   Please call to schedule your mammogram and bone density test.   [facility]  [phone] [address] [place], [place] [address]   Get your Tdap tetanus booster at your pharmacy   Regarding indigestion Make sure you take your Protonix 40mg  daily on empty stomach at least 30-45 minutes prior to breakfast Consider adding Pepcid/Famotidine OTC in the evening for the next 2 weeks Avoid foods that make it worse such as tomatoes, citrus, oranges, lemons, brake foods, spicy foods, greasy foods, big portions and do not eat close to bedtime or lay down right after eating If needed, you can also use tums or pepto bismol  some   Diabetes Continue daily foot checks Check blood sugars fasting in the morning several days per week, goal is between 80-130 fasting Stop metformin for the time being Begin Januvia 100 mg, 1/2 tablet daily Continue glipizide 2.5 mg XL daily  High cholesterol Continue atorvastatin Lipitor 40 mg daily along with aspirin 81 mg daily  Hypertension Continue valsartan HCT 160/12.5 mg daily  Continue other medicines as usual

## 2022-12-25 NOTE — Progress Notes (Signed)
Subjective:  Deborah Simon is a 72 y.o. female who presents for Chief Complaint  Patient presents with   Medical Management of Chronic Issues    Med check. Having some digestion issues, metrics- needs mammogram, eye screening, A1C, EGFR, and Urine Albumin Creatinine Ratio Test     Accompanied by son and caregiver Deborah Simon   Medical team: Deborah Penny, NP, neurology Dr. Amada Simon, Deborah Monica Becton, PA, GI No cardiologist Dinnis Rog, Kermit Balo, PA-C here now, formerly was Hetty Blend, NP  Her medical history includes dementia, diabetes, hypertension, hyperlipidemia, GERD, chronic back pain, asthma, fatty liver disease, tremor, diabetic retinopathy.   Concerns: Lives with her son and he helps her with her medicaiton  Diabetes-compliant with metformin 500 mg, 1/2 tablet 250 mg twice daily, glipizide XL 2.5 mg daily.   Checking blood sugars maybe 1-2 times per week, usually good at home.  Hyperlipidemia-compliant with aspirin 81 mg and atorvastatin 40 mg daily  Hypertension-compliant with valsartan HCT 160/12.5 mg daily  Memory issues-currently on Aricept 5 mg daily, Namenda 10 mg twice daily, Celexa 20 mg daily-we  Needs pharm D review  Using the Breztri daily preventative inhaler, and uses albuterol prn but no often on the Albuterol  Having indigestion and digestion problems.  No diarrhea, no blood in stool, no vomiting, no food stuck in throat. Takes protonix not on empty stomach   No other aggravating or relieving factors.    No other c/o.  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 90 tablet 3   Blood Glucose Monitoring Suppl (ACCU-CHEK GUIDE ME) w/Device KIT TESTS  BLOOD SUGARS ONCE A DAY. E11.9 1 kit 0   Cetirizine HCl (ZYRTEC PO) Take by mouth.     Cholecalciferol (VITAMIN D) 50 MCG (2000 UT) CAPS Take 1 capsule (2,000 Units total) by mouth daily. 90 capsule 3   donepezil (ARICEPT) 5 MG tablet Take 1 tablet (5 mg total) by mouth at bedtime. 30 tablet 5   Famotidine (PEPCID PO) Take by mouth.     glipiZIDE (GLUCOTROL XL) 2.5 MG 24 hr tablet TAKE 1 TABLET BY MOUTH EVERY DAY WITH BREAKFAST 90 tablet 1   memantine (NAMENDA) 10 MG tablet TAKE 1 TABLET BY MOUTH TWICE A DAY 180 tablet 1   Multiple Vitamins-Minerals (MULTIVITAMIN WOMEN 50+ PO) Take 1 tablet by mouth daily.     pantoprazole (PROTONIX) 40 MG tablet TAKE 1 TABLET DAILY 45 MIN PRIOR TO BREAKFAST 90 tablet 1   primidone (MYSOLINE) 50 MG tablet TAKE 1 TABLET BY MOUTH EVERYDAY AT BEDTIME 90 tablet 2   valsartan-hydrochlorothiazide (DIOVAN-HCT) 160-12.5 MG tablet TAKE 1 TABLET BY MOUTH EVERY DAY 90 tablet 1   citalopram (CELEXA) 20 MG tablet TAKE 1 TABLET BY MOUTH EVERY DAY (Patient not taking: Reported on 12/25/2022) 90 tablet 0   No current facility-administered medications on file prior to visit.   Past Surgical History:  Procedure Laterality Date   CHOLECYSTECTOMY  10/07/11   Single site   COLONOSCOPY  03/27/2008   Dr.Patterson-normal   Family History  Problem Relation Age of Onset   Stroke Mother    Stroke Father    Aneurysm Son    Colon  cancer Neg Hx    Colon polyps Neg Hx    Esophageal cancer Neg Hx    Stomach cancer Neg Hx    Rectal cancer Neg Hx    The following portions of the patient's history were reviewed and updated as appropriate: allergies, current medications, past family history, past medical history, past social history, past surgical history and problem list.  ROS Otherwise as in subjective above    Objective: BP 120/70   Pulse 78   Wt 175 lb 6.4 oz (79.6 kg)   LMP  (LMP Unknown)   BMI 30.11 kg/m   BP Readings from Last 3 Encounters:  12/25/22 120/70   12/08/22 (!) 141/74  05/26/22 124/70   Wt Readings from Last 3 Encounters:  12/25/22 175 lb 6.4 oz (79.6 kg)  12/08/22 180 lb (81.6 kg)  06/24/22 174 lb (78.9 kg)   General appearance: alert, no distress, well developed, well nourished Neck: supple, no lymphadenopathy, no thyromegaly, no masses, no bruits, no JVD Heart: RRR, normal S1, S2, no murmurs Lungs: decreased sounds in general, otherwise no wheezes, rhonchi, or rales Pulses: 2+ radial pulses, 2+ pedal pulses, normal cap refill Ext: no edema Psych: Pleasant, answers questions, conversive  Diabetic Foot Exam - Simple   Simple Foot Form Diabetic Foot exam was performed with the following findings: Yes 12/25/2022 12:33 PM  Visual Inspection No deformities, no ulcerations, no other skin breakdown bilaterally: Yes Sensation Testing Intact to touch and monofilament testing bilaterally: Yes Pulse Check See comments: Yes Comments 1+ pedal pulses       Assessment: Encounter Diagnoses  Name Primary?   Postmenopausal estrogen deficiency Yes   Encounter for screening mammogram for malignant neoplasm of breast    Vaccine counseling    Alkaline phosphatase elevation    Type 2 diabetes mellitus with chronic kidney disease, without long-term current use of insulin, unspecified CKD stage (HCC)    Cough      Plan: Reviewed her health history, medications, previous labs.  We discussed the following recommendations and changes to her diabetes regimen as below.  I also wrote a prescription for glucometer testing supplies and lancets today.   Patient Instructions  Recommendations: Try and get appointment with your eye doctor soon for yearly eye and diabetic eye exam.  Make sure they end [place] a copy of the note.   Please call to schedule your mammogram and bone density test.   [facility]  [phone] [address] [place], [place] [address]   Get your Tdap tetanus booster at your  pharmacy   Regarding indigestion Make sure you take your Protonix 40mg  daily on empty stomach at least 30-45 minutes prior to breakfast Consider adding Pepcid/Famotidine OTC in the evening for the next 2 weeks Avoid foods that make it worse such as tomatoes, citrus, oranges, lemons, brake foods, spicy foods, greasy foods, big portions and do not eat close to bedtime or lay down right after eating If needed, you can also use tums or pepto bismol  some   Diabetes Continue daily foot checks Check blood sugars fasting in the morning several days per week, goal is between 80-130 fasting Stop metformin for the time being Begin Januvia 100 mg, 1/2 tablet daily Continue glipizide 2.5 mg XL daily  High cholesterol Continue atorvastatin Lipitor 40 mg daily along with aspirin 81 mg daily  Hypertension Continue valsartan HCT 160/12.5 mg daily  Continue other medicines as usual  Wanell was seen today for  medical management of chronic issues.  Diagnoses and all orders for this visit:  Postmenopausal estrogen deficiency -     DG Bone Density; Future  Encounter for screening mammogram for malignant neoplasm of breast -     MM 3D SCREENING MAMMOGRAM BILATERAL BREAST  Vaccine counseling  Alkaline phosphatase elevation  Type 2 diabetes mellitus with chronic kidney disease, without long-term current use of insulin, unspecified CKD stage (HCC) -     CMP14+EGFR -     POCT glycosylated hemoglobin (Hb A1C)  Cough Comments: chronic, worse since COVID. Nothing to suggest bacterial infection. suspect post-viral cough vs RAD. Treat with steroids, risks/SE reviewed. CXR given risks Orders: -     albuterol (VENTOLIN HFA) 108 (90 Base) MCG/ACT inhaler; INHALE 1-2 PUFFS BY MOUTH EVERY 6 HOURS AS NEEDED FOR WHEEZE OR SHORTNESS OF BREATH  Other orders -     Budeson-Glycopyrrol-Formoterol (BREZTRI AEROSPHERE) 160-9-4.8 MCG/ACT AERO; Inhale 2 puffs into the lungs in the morning and at bedtime. -      sitaGLIPtin (JANUVIA) 50 MG tablet; Take 1 tablet (50 mg total) by mouth daily.    Follow up: 49mo

## 2022-12-26 ENCOUNTER — Other Ambulatory Visit: Payer: Self-pay | Admitting: Medical

## 2022-12-26 LAB — CMP14+EGFR
ALT: 27 IU/L (ref 0–32)
AST: 26 IU/L (ref 0–40)
Albumin: 4.7 g/dL (ref 3.8–4.8)
Alkaline Phosphatase: 165 IU/L — ABNORMAL HIGH (ref 44–121)
BUN/Creatinine Ratio: 10 — ABNORMAL LOW (ref 12–28)
BUN: 13 mg/dL (ref 8–27)
Bilirubin Total: 0.3 mg/dL (ref 0.0–1.2)
CO2: 24 mmol/L (ref 20–29)
Calcium: 10.1 mg/dL (ref 8.7–10.3)
Chloride: 101 mmol/L (ref 96–106)
Creatinine, Ser: 1.33 mg/dL — ABNORMAL HIGH (ref 0.57–1.00)
Globulin, Total: 2.6 g/dL (ref 1.5–4.5)
Glucose: 145 mg/dL — ABNORMAL HIGH (ref 70–99)
Potassium: 4.4 mmol/L (ref 3.5–5.2)
Sodium: 140 mmol/L (ref 134–144)
Total Protein: 7.3 g/dL (ref 6.0–8.5)
eGFR: 43 mL/min/{1.73_m2} — ABNORMAL LOW (ref >59)

## 2022-12-26 MED ORDER — VITAMIN D (ERGOCALCIFEROL) 1.25 MG (50000 UNIT) PO CAPS: 50000.0000 [IU] | ORAL_CAPSULE | ORAL | 3 refills | Status: DC

## 2022-12-26 MED ORDER — CITALOPRAM HYDROBROMIDE 20 MG PO TABS: 20.0000 mg | ORAL_TABLET | Freq: Every day | ORAL | 3 refills | Status: DC

## 2022-12-26 NOTE — Progress Notes (Signed)
Results sent through MyChart

## 2022-12-29 ENCOUNTER — Other Ambulatory Visit: Payer: Self-pay | Admitting: Adult Health

## 2023-01-25 ENCOUNTER — Other Ambulatory Visit: Payer: Self-pay | Admitting: Medical

## 2023-01-25 ENCOUNTER — Other Ambulatory Visit: Payer: Self-pay | Admitting: Adult Health

## 2023-01-25 DIAGNOSIS — I10 Essential (primary) hypertension: Secondary | ICD-10-CM

## 2023-01-25 DIAGNOSIS — E1169 Type 2 diabetes mellitus with other specified complication: Secondary | ICD-10-CM

## 2023-01-26 NOTE — Telephone Encounter (Signed)
Atorvastatin was filled for a year back 03/2022

## 2023-02-04 DIAGNOSIS — T1490XA Injury, unspecified, initial encounter: Secondary | ICD-10-CM | POA: Diagnosis not present

## 2023-02-04 DIAGNOSIS — M25512 Pain in left shoulder: Secondary | ICD-10-CM | POA: Diagnosis not present

## 2023-02-10 ENCOUNTER — Other Ambulatory Visit: Payer: Self-pay | Admitting: Medical

## 2023-03-21 ENCOUNTER — Other Ambulatory Visit: Payer: Self-pay | Admitting: Medical

## 2023-03-23 NOTE — Telephone Encounter (Signed)
On higher dose vitamin d

## 2023-04-16 ENCOUNTER — Other Ambulatory Visit: Payer: Self-pay | Admitting: Medical

## 2023-04-16 DIAGNOSIS — R059 Cough, unspecified: Secondary | ICD-10-CM

## 2023-04-21 ENCOUNTER — Other Ambulatory Visit: Payer: Self-pay | Admitting: Medical

## 2023-05-19 ENCOUNTER — Ambulatory Visit: Payer: Medicare HMO | Admitting: Adult Health

## 2023-05-19 ENCOUNTER — Encounter: Payer: Self-pay | Admitting: Adult Health

## 2023-05-19 VITALS — BP 140/82 | HR 88 | Ht 61.0 in | Wt 187.0 lb

## 2023-05-19 DIAGNOSIS — R251 Tremor, unspecified: Secondary | ICD-10-CM

## 2023-05-19 DIAGNOSIS — F0390 Unspecified dementia without behavioral disturbance: Secondary | ICD-10-CM

## 2023-05-19 MED ORDER — PRIMIDONE 50 MG PO TABS: 25.0000 mg | ORAL_TABLET | Freq: Two times a day (BID) | ORAL | 2 refills | Status: DC

## 2023-05-19 NOTE — Patient Instructions (Signed)
Your Plan:  Continue Namenda 10 mg twice day  Continue Aricept 5 mg at bedtime  Change Primidone 50 mg to 1/2 tablet twice a day  If your symptoms worsen or you develop new symptoms please let us know.     Thank you for coming to see [place] at [facility]. I hope we have been able to provide you high quality care today.  You may receive a patient satisfaction survey over the next few weeks. We would appreciate your feedback and comments so that we may continue to improve ourselves and the health of our patients.

## 2023-05-19 NOTE — Progress Notes (Signed)
PATIENT: Deborah Simon DOB: [birth year]  REASON FOR VISIT: follow up HISTORY FROM: patient  Chief Complaint  Patient presents with   Follow-up    Pt in 20, here with son Tyrone Pt is her for dementia follow up and tremor. Pt states her tremor is getting worse on the right hand. States memory has been stable. MMSE 11/30     HISTORY OF PRESENT ILLNESS: Today 05/19/23:  Deborah Simon is a 72 y.o. female with a history of memory disturbance and essential tremor. Returns today for follow-up.  She is here today with her son.  She feels that her memory has remained stable.  She continues to live with her son and daughter-in-law.  Her daughter-in-law continues to manage her appointments, medications and finances.  She remains on Aricept 5 mg at bedtime and Namenda 10 mg twice a day.  She does feel that the tremor is worse at times.  Right hand greater than left.  She remains on primidone 50 mg at bedtime   12/08/22: Deborah Simon is a 72 y.o. female with a history of memory disturbance and essential tremor. Returns today for follow-up.  She is here today with her sister and daughter-in-law.  Daughter-in-law feels that her memory is worse.  She requires prompting with all ADLs.  She lives with her son and daughter-in-law.  Daughter-in-law manages her finances appointments and medications.  At the last visit we restarted Aricept.  She has been tolerating this well.  No headaches.  Her sister reports some loose stools but this was going on before she started Aricept.   05/26/22: Deborah Simon is a 72 y.o. female with a history of memory disturbance and essential tremor.  She returns today for follow-up.  She continues to live with her son and daughter-in-law.  She is able to complete all ADLs independently.  Her son helps her with her medications ,, appointments and finances.  She does not operate a motor vehicle.  Denies any trouble sleeping.  At the last visit we did order primidone 25 mg at  bedtime for essential tremor.  Daughter in law has not seen a big improvement in the tremors. Daughter in law would like the patient to retry Aricept as she is not convinced that mediation caused her headaches. Patient agrees.    11/18/21: Deborah Simon is a 72 year old female with a history of memory disturbance.  She returns today for follow-up. Feels that memory is the same. Reports that she is forgetful with short term memory. Lives with her son. Able to complete all ADLs independently. Son helps with her medications, appointments and finances. Not driving. Sleeping ok and reports good appetite. Reports that some foods cause her indigestion. Uses OTC antiacid.   Reports tremor in the upper extremities.  Son notices that with anxiety.  The patient states that she has a tremor every time she eats and sometimes with handwriting   10/08/20: Deborah Simon is a 72 year old female with a history of memory disturbance.  She returns today to discuss her results from her neurocognitive evaluation.  I have reviewed these results with the patient.  Dr. Roseanne Reno have recommended further evaluation with LP and AD biomarkers.  The patient at this time does not want to proceed with any further work-up.  She remains on Namenda 10 mg twice a day.  She lives at home with her daughter in law and son.  She is able to complete all ADLs independently.  She continues to cook without difficulty.  She  does not operate a motor vehicle.  Her daughter-in-law manages her medications and appointments.  She denies any trouble sleeping.  She returns today for an evaluation.  04/03/20: Deborah Simon is a 72 year old female with a history of intermittent tremor the returns today for follow-up.  Her main concern today is memory disturbance.  She states that she has some issues with word finding.  She lives at home with her son.  She is able to complete all ADLs independently.  She manages her own finances without difficulty.  She manages her own  medications and appointments.  She does use a pillbox and references a calendar.  She is able to shop independently for her needs but the granddaughter reports that she does need a list.  No change in mood or behavior.  Patient returns today for an evaluation.  HISTORY Deborah Simon is a 72 year old right-handed woman with an underlying medical history of hypertension, hyperlipidemia, type 2 diabetes, and overweight state, who reports an intermittent right hand tremor for the past 4 weeks or so.  Symptoms came on fairly suddenly.  They are primarily related to using her right hand and arm.  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  She has quite a bit of stress at work and is contemplating retiring.  She does better at home.  She gets tearful at times because of stress and trembling and it helps to calm her down by talking to her.  Her son has been helpful in that regard and has been able to calm her down.  Rubbing her hands on her thighs helps calm her down.  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  She does not drink caffeine on a day-to-day basis, she does not drink alcohol and is a non-smoker.  She tries to hydrate well, drinks about 2 bottles of 16 ounce water per day at work and also water at home.  She lives with one of her 2 sons who are grown.  She has not had any treatment for anxiety or stress. I reviewed your office note from 03/11/2019, which you kindly included.  She had blood work in your office on 03/11/2019 and I reviewed the results: Lipid panel showed benign findings with a total cholesterol of 139, HDL 60, triglycerides 54 and LDL 66.  CMP showed glucose mildly elevated at 120, BUN 19, creatinine 1.14, otherwise normal findings, CBC with  differential showed WBC of 6.5, hemoglobin 11.4 which is mildly low and hematocrit 33.7 which is also mildly low, platelets mildly elevated at 439, otherwise benign findings, A1c was mildly elevated at 6.6. She denies a family history of tremor or Parkinson's disease.  She has not fallen.  She has an eye examination once a year with Dr. Nile Riggs and is due for an appointment.  REVIEW OF SYSTEMS: Out of a complete 14 system review of symptoms, the patient complains only of the following symptoms, and all other reviewed systems are negative.  See HPI  ALLERGIES: Allergies  Allergen Reactions   Ace Inhibitors Swelling    Angioedema    Aricept [Donepezil]     headaches   Azithromycin Swelling   Lisinopril Swelling   Penicillins Other (See Comments)  Note  Sure rash of some sort probably, nothing airway related    HOME MEDICATIONS: Outpatient Medications Prior to Visit  Medication Sig Dispense Refill   albuterol (VENTOLIN HFA) 108 (90 Base) MCG/ACT inhaler INHALE 1-2 PUFFS BY MOUTH EVERY 6 HOURS AS NEEDED FOR WHEEZE OR SHORTNESS OF BREATH 18 each 0   atorvastatin (LIPITOR) 40 MG tablet Take 1 tablet (40 mg total) by mouth daily. 90 tablet 3   Blood Glucose Monitoring Suppl (ACCU-CHEK GUIDE ME) w/Device KIT TESTS BLOOD SUGARS ONCE A DAY. E11.9 1 kit 0   Budeson-Glycopyrrol-Formoterol (BREZTRI AEROSPHERE) 160-9-4.8 MCG/ACT AERO Inhale 2 puffs into the lungs in the morning and at bedtime. 10.7 each 5   Cetirizine HCl (ZYRTEC PO) Take by mouth.     citalopram (CELEXA) 20 MG tablet Take 1 tablet (20 mg total) by mouth daily. 90 tablet 3   donepezil (ARICEPT) 5 MG tablet TAKE 1 TABLET BY MOUTH EVERYDAY AT BEDTIME 90 tablet 1   Famotidine (PEPCID PO) Take by mouth.     glipiZIDE (GLUCOTROL XL) 2.5 MG 24 hr tablet TAKE 1 TABLET BY MOUTH EVERY DAY WITH BREAKFAST 90 tablet 1   memantine (NAMENDA) 10 MG tablet TAKE 1 TABLET BY MOUTH TWICE A DAY 180 tablet 1   Multiple Vitamins-Minerals  (MULTIVITAMIN WOMEN 50+ PO) Take 1 tablet by mouth daily.     pantoprazole (PROTONIX) 40 MG tablet TAKE 1 TABLET DAILY 45 MIN PRIOR TO BREAKFAST 90 tablet 1   primidone (MYSOLINE) 50 MG tablet TAKE 1 TABLET BY MOUTH EVERYDAY AT BEDTIME 90 tablet 2   sitaGLIPtin (JANUVIA) 50 MG tablet Take 1 tablet (50 mg total) by mouth daily. 90 tablet 0   valsartan-hydrochlorothiazide (DIOVAN-HCT) 160-12.5 MG tablet TAKE 1 TABLET BY MOUTH EVERY DAY 90 tablet 1   Vitamin D, Ergocalciferol, (DRISDOL) 1.25 MG (50000 UNIT) CAPS capsule Take 1 capsule (50,000 Units total) by mouth every 7 (seven) days. 12 capsule 3   No facility-administered medications prior to visit.    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   Diabetes mellitus    Fatty liver 07/12/2019   Hyperlipidemia    Hypertension     PAST SURGICAL HISTORY: Past Surgical History:  Procedure Laterality Date   CHOLECYSTECTOMY  10/07/11   Single site   COLONOSCOPY  03/27/2008   Dr.Patterson-normal    FAMILY HISTORY: Family History  Problem Relation Age of Onset   Stroke Mother    Stroke Father    Aneurysm Son    Colon cancer Neg Hx    Colon polyps Neg Hx    Esophageal cancer Neg Hx    Stomach cancer Neg Hx    Rectal cancer Neg Hx     SOCIAL HISTORY: Social History   Socioeconomic History   Marital status: Single    Spouse name: Not on file   Number of children: 3   Years of education: Not on file   Highest education level: High school graduate  Occupational History   Not on file  Tobacco Use   Smoking status: Never   Smokeless tobacco: Never  Vaping Use   Vaping status: Never Used  Substance and Sexual Activity   Alcohol use: No   Drug use: No   Sexual activity: Not Currently  Other Topics Concern   Not on file  Social History Narrative   Lives with son & daughter in law   Right handed   Caffeine:  hot tea    Social Determinants of  Health   Financial Resource Strain: Low Risk  (12/25/2022)   Overall Financial Resource Strain (CARDIA)    Difficulty of Paying Living Expenses: Not hard at all  Food Insecurity: No Food Insecurity (12/25/2022)   Hunger Vital Sign    Worried About Running Out of Food in the Last Year: Never true    Ran Out of Food in the Last Year: Never true  Transportation Needs: No Transportation Needs (06/24/2022)   PRAPARE - Administrator, Civil Service (Medical): No    Lack of Transportation (Non-Medical): No  Physical Activity: Insufficiently Active (12/25/2022)   Exercise Vital Sign    Days of Exercise per Week: 4 days    Minutes of Exercise per Session: 10 min  Stress: Stress Concern Present (12/25/2022)   [employer] of Occupational Health - Occupational Stress Questionnaire    Feeling of Stress : Rather much  Social Connections: Moderately Isolated (12/25/2022)   Social Connection and Isolation Panel [NHANES]    Frequency of Communication with Friends and Family: More than three times a week    Frequency of Social Gatherings with Friends and Family: More than three times a week    Attends Religious Services: More than 4 times per year    Active Member of [employer] or Organizations: No    Attends Banker Meetings: Never    Marital Status: Divorced  Catering manager Violence: Not At Risk (12/25/2022)   Humiliation, Afraid, Rape, and Kick questionnaire    Fear of Current or Ex-Partner: No    Emotionally Abused: No    Physically Abused: No    Sexually Abused: No      PHYSICAL EXAM  There were no vitals filed for this visit.    There is no height or weight on file to calculate BMI.      05/19/2023   12:49 PM 12/08/2022    1:42 PM 05/26/2022    1:37 PM  MMSE - Mini Mental State Exam  Orientation to time 0 1 0  Orientation to Place 1 2 4   Registration 3 3 3   Attention/ Calculation 0 0 0  Recall 0 0 0  Language- name 2 objects 2 2 2   Language- repeat 1 0 0   Language- follow 3 step command 3 3 3   Language- read & follow direction 1 1 1   Write a sentence 0 1 0  Copy design 0 0 0  Total score 11 13 13       Generalized: Well developed, in no acute distress   Neurological examination  Mentation: Alert oriented to time, place, history taking. Follows all commands speech and language fluent Cranial nerve II-XII: Pupils were equal round reactive to light. Extraocular movements were full, visual field were full on confrontational test.  Head turning and shoulder shrug  were normal and symmetric. Motor: The motor testing reveals 5 over 5 strength of all 4 extremities. Good symmetric motor tone is noted throughout.  Sensory: Sensory testing is intact to soft touch on all 4 extremities. No evidence of extinction is noted.  Coordination: Cerebellar testing reveals good finger-nose-finger and heel-to-shin bilaterally. Mild tremor in the upper extremities with finger-nose-finger Gait and station: Gait is normal.    DIAGNOSTIC DATA (LABS, IMAGING, TESTING) - I reviewed patient records, labs, notes, testing and imaging myself where available.  Lab Results  Component Value Date   WBC 7.9 03/25/2022   HGB 11.7 03/25/2022   HCT 34.4  03/25/2022   MCV 86 03/25/2022   PLT 416 03/25/2022      Component Value Date/Time   NA 140 12/25/2022 1227   K 4.4 12/25/2022 1227   CL 101 12/25/2022 1227   CO2 24 12/25/2022 1227   GLUCOSE 145 (H) 12/25/2022 1227   GLUCOSE 131 (H) 10/05/2019 0315   BUN 13 12/25/2022 1227   CREATININE 1.33 (H) 12/25/2022 1227   CREATININE 1.00 (H) 11/16/2015 1008   CALCIUM 10.1 12/25/2022 1227   PROT 7.3 12/25/2022 1227   ALBUMIN 4.7 12/25/2022 1227   AST 26 12/25/2022 1227   ALT 27 12/25/2022 1227   ALKPHOS 165 (H) 12/25/2022 1227   BILITOT 0.3 12/25/2022 1227   GFRNONAA 52 (L) 01/25/2020 1456   GFRNONAA 60 11/16/2015 1008   GFRAA 60 01/25/2020 1456   GFRAA 69 11/16/2015 1008   Lab Results  Component Value Date    CHOL 123 03/25/2022   HDL 50 03/25/2022   LDLCALC 58 03/25/2022   LDLDIRECT 131 (H) 09/01/2011   TRIG 73 03/25/2022   CHOLHDL 2.5 03/25/2022   Lab Results  Component Value Date   HGBA1C 7.8 (A) 12/25/2022   Lab Results  Component Value Date   VITAMINB12 680 09/28/2019   Lab Results  Component Value Date   TSH 0.991 03/25/2022      ASSESSMENT AND PLAN 72 y.o. year old female  has a past medical history of Degenerative joint disease (DJD) of lumbar spine (11/01/2019), Diabetes mellitus, Fatty liver (07/12/2019), Hyperlipidemia, and Hypertension. here with:  1.  Memory disturbance  -MMSE 11/30 previously 13 out of 30 -Continue Namenda 10 mg twice a day -Continue Aricept 5 mg at bedtime  2.  Tremor  -Change Primidone 25 mg in the AM and PM FU 8 months or sooner if needed   Advised if symptoms worsen or she develops new symptoms she should let us know.     Butch Penny, MSN, NP-C 05/19/2023, 12:34 PM [facility] [address], [address] [place], [place] [address] [phone]

## 2023-05-20 ENCOUNTER — Other Ambulatory Visit: Payer: Self-pay | Admitting: Medical

## 2023-05-20 DIAGNOSIS — R059 Cough, unspecified: Secondary | ICD-10-CM

## 2023-06-15 ENCOUNTER — Ambulatory Visit: Payer: Medicare HMO | Admitting: Adult Health

## 2023-06-16 ENCOUNTER — Other Ambulatory Visit: Payer: Self-pay | Admitting: Medical

## 2023-06-30 ENCOUNTER — Ambulatory Visit (INDEPENDENT_AMBULATORY_CARE_PROVIDER_SITE_OTHER): Payer: Medicare HMO | Admitting: Medical

## 2023-06-30 VITALS — BP 124/68 | HR 75 | Wt 188.2 lb

## 2023-06-30 DIAGNOSIS — R062 Wheezing: Secondary | ICD-10-CM

## 2023-06-30 DIAGNOSIS — E11319 Type 2 diabetes mellitus with unspecified diabetic retinopathy without macular edema: Secondary | ICD-10-CM | POA: Diagnosis not present

## 2023-06-30 DIAGNOSIS — E1122 Type 2 diabetes mellitus with diabetic chronic kidney disease: Secondary | ICD-10-CM | POA: Diagnosis not present

## 2023-06-30 DIAGNOSIS — E1169 Type 2 diabetes mellitus with other specified complication: Secondary | ICD-10-CM

## 2023-06-30 DIAGNOSIS — R748 Abnormal levels of other serum enzymes: Secondary | ICD-10-CM | POA: Diagnosis not present

## 2023-06-30 DIAGNOSIS — Z780 Asymptomatic menopausal state: Secondary | ICD-10-CM

## 2023-06-30 DIAGNOSIS — E559 Vitamin D deficiency, unspecified: Secondary | ICD-10-CM | POA: Diagnosis not present

## 2023-06-30 DIAGNOSIS — R059 Cough, unspecified: Secondary | ICD-10-CM | POA: Diagnosis not present

## 2023-06-30 DIAGNOSIS — E785 Hyperlipidemia, unspecified: Secondary | ICD-10-CM

## 2023-06-30 DIAGNOSIS — Z1231 Encounter for screening mammogram for malignant neoplasm of breast: Secondary | ICD-10-CM | POA: Diagnosis not present

## 2023-06-30 DIAGNOSIS — J4540 Moderate persistent asthma, uncomplicated: Secondary | ICD-10-CM

## 2023-06-30 DIAGNOSIS — J988 Other specified respiratory disorders: Secondary | ICD-10-CM

## 2023-06-30 LAB — POCT GLYCOSYLATED HEMOGLOBIN (HGB A1C): Hemoglobin A1C: 7.7 % — AB (ref 4.0–5.6)

## 2023-06-30 LAB — LIPID PANEL

## 2023-06-30 MED ORDER — ALBUTEROL SULFATE HFA 108 (90 BASE) MCG/ACT IN AERS: INHALATION_SPRAY | RESPIRATORY_TRACT | 0 refills | Status: DC

## 2023-06-30 MED ORDER — PREDNISONE 20 MG PO TABS: ORAL_TABLET | ORAL | 0 refills | Status: DC

## 2023-06-30 MED ORDER — CEFUROXIME AXETIL 500 MG PO TABS: 500.0000 mg | ORAL_TABLET | Freq: Two times a day (BID) | ORAL | 0 refills | Status: AC

## 2023-06-30 MED ORDER — ATORVASTATIN CALCIUM 40 MG PO TABS: 40.0000 mg | ORAL_TABLET | Freq: Every day | ORAL | 2 refills | Status: DC

## 2023-06-30 MED ORDER — SITAGLIPTIN PHOSPHATE 50 MG PO TABS: 50.0000 mg | ORAL_TABLET | Freq: Every day | ORAL | 0 refills | Status: DC

## 2023-06-30 MED ORDER — GLIPIZIDE ER 5 MG PO TB24: 5.0000 mg | ORAL_TABLET | Freq: Every day | ORAL | 1 refills | Status: DC

## 2023-06-30 MED ORDER — BREZTRI AEROSPHERE 160-9-4.8 MCG/ACT IN AERO: 2.0000 | INHALATION_SPRAY | Freq: Two times a day (BID) | RESPIRATORY_TRACT | 5 refills | Status: AC

## 2023-06-30 NOTE — Progress Notes (Signed)
 Subjective:  Deborah Simon is a 73 y.o. female who presents for Chief Complaint  Patient presents with   chest congestion    Chest congestion x 3 days. Taking mucinex  DM, cough medication.   Needs mammogram, eye exam, bone density, A1C and urine micro checked today.      Here today with son Deborah Simon in which she lives with  Medications reviewed and she is compliant with medications  Here for 3-day history of chest congestion, cough, some mucous.  Using mucinex  DM.  Feels a little SOB  and wheezing.   Using albuterol  about 2 times daily currently.  Never smoked.  Using Breztri  BID.  No body aches, no chills, no fever.   No NVD.   Diabetes - checking glucose regularly, taking Januvia  50mg  daily, Glucotrol  XL 2.5mg  daily.   Compliant with medications for dementia  Compliant with cholesterol medication without complaint  Compliant with blood pressure medicine  No other aggravating or relieving factors.    No other c/o.  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     citalopram  (CELEXA ) 20 MG tablet Take 1 tablet (20 mg total) by mouth daily. 90 tablet 3   donepezil  (ARICEPT ) 5 MG tablet TAKE 1 TABLET BY MOUTH EVERYDAY AT BEDTIME 90 tablet 1   Famotidine (PEPCID PO) Take by mouth.     memantine  (NAMENDA ) 10 MG tablet TAKE 1 TABLET BY MOUTH TWICE A DAY 180 tablet 1   Multiple Vitamins-Minerals (MULTIVITAMIN WOMEN 50+ PO) Take 1 tablet by mouth daily.     pantoprazole  (PROTONIX ) 40 MG tablet TAKE 1 TABLET DAILY 45 MIN PRIOR TO BREAKFAST 90 tablet 1   primidone  (MYSOLINE ) 50 MG tablet Take 0.5 tablets (25 mg total) by mouth 2 (two) times daily. 90 tablet 2   valsartan -hydrochlorothiazide  (DIOVAN -HCT) 160-12.5 MG tablet  TAKE 1 TABLET BY MOUTH EVERY DAY 90 tablet 1   Vitamin D , Ergocalciferol , (DRISDOL ) 1.25 MG (50000 UNIT) CAPS capsule Take 1 capsule (50,000 Units total) by mouth every 7 (seven) days. 12 capsule 3   Blood Glucose Monitoring Suppl (ACCU-CHEK GUIDE ME) w/Device KIT TESTS BLOOD SUGARS ONCE A DAY. E11.9 1 kit 0   No current facility-administered medications on file prior to visit.     The following portions of the patient's history were reviewed and updated as appropriate: allergies, current medications, past family history, past medical history, past social history, past surgical history and problem list.  ROS Otherwise as in subjective above    Objective: BP 124/68   Pulse 75   Wt 188 lb 3.2 oz (85.4 kg)   LMP  (LMP Unknown)   SpO2 96%   BMI 35.56 kg/m   Wt Readings from Last 3 Encounters:  06/30/23 188 lb 3.2 oz (85.4 kg)  05/19/23 187 lb (84.8 kg)  12/25/22 175 lb 6.4 oz (79.6 kg)    General appearance: alert, no distress, well developed, well nourished HEENT: normocephalic, sclerae anicteric, conjunctiva pink and moist, nares with some mucoid discharge, mild erythema, pharynx normal Oral cavity: MMM, no lesions Neck: supple, no lymphadenopathy, no thyromegaly, no masses Heart: RRR, normal S1, S2, no murmurs Lungs: scattered wheezes, +rhonchi, or rales Pulses: 2+ radial pulses, 2+ pedal pulses,  normal cap refill Ext: no edema    Assessment: Encounter Diagnoses  Name Primary?   Type 2 diabetes mellitus with chronic kidney disease, without long-term current use of insulin , unspecified CKD stage (HCC) Yes   Hyperlipidemia associated with type 2 diabetes mellitus (HCC)    Diabetic retinopathy associated with controlled type 2 diabetes mellitus (HCC)    Encounter for screening mammogram for malignant neoplasm of breast    Postmenopausal estrogen deficiency    Vitamin D  deficiency    Alkaline phosphatase elevation    Cough    Respiratory tract infection    Wheezing     Moderate persistent asthma without complication      Plan: Cough and respiratory tract infection Drink plenty water such as 80 to 100 ounces of water daily Continue Mucinex  DM a few more days Begin Ceftin  antibiotic twice a day for a week Begin a 3-day course of prednisone  steroid to help reduce inflammation in the lungs Continue your Breztri  inhaler twice daily inhaler for prevention Continue albuterol  as needed every 4-6 hours for wheezing tightness and shortness of breath If much worse or not improving in the next 3 days let me know  Diabetes We confirmed today that you are no longer on metformin  Continue Januvia  daily I increased the dose of glipizide  to 5 mg XR daily so you can discontinue the 2.5 mg dose you have at home If you happen to see any blood sugar readings less than 60 in the coming month or 2 then let me know right away as we would go back to the 2.5 mg glipizide  We want to avoid low sugars or hypoglycemia Hgba1c 7.7% today  Continue the rest your medicines as usual  We will call with lab results  At your convenience call and schedule your mammogram and bone density test  We generally stop doing mammograms at age 70  Hyperlipidemia-continue atorvastatin  40 mg daily  Hypertension-continue valsartan  HCT 160/12.5 mg daily  Asthma-continue Breztri  BID and albuterol  as needed  Vitamin D  deficiency-continue vitamin D  supplement, changed to weekly 50000  last visit.  Updating labs today.  Diabetic retinopathy-advise she see her eye doctor yearly and make sure they send us  a copy of the notes  Encouraged her to complete mammogram and bone density scans.  Orders are in place.  We gave her the phone number to call and schedule   Briellah was seen today for chest congestion.  Diagnoses and all orders for this visit:  Type 2 diabetes mellitus with chronic kidney disease, without long-term current use of insulin , unspecified CKD stage (HCC) -     Urine  Albumin/Creatinine with ratio (send out) [LAB689] -     HgB A1c -     Comprehensive metabolic panel -     CBC  Hyperlipidemia associated with type 2 diabetes mellitus (HCC) -     Urine Albumin/Creatinine with ratio (send out) [[id number]] -     HgB A1c -     Lipid panel -     Comprehensive metabolic panel -     CBC -     atorvastatin  (LIPITOR) 40 MG tablet; Take 1 tablet (40 mg total) by mouth daily.  Diabetic retinopathy associated with controlled type 2 diabetes mellitus (HCC) -     Ambulatory referral to Ophthalmology  Encounter for screening mammogram for malignant neoplasm of breast -     MM 3D SCREENING MAMMOGRAM BILATERAL BREAST; Future  Postmenopausal estrogen deficiency -     DG Bone Density; Future  Vitamin D  deficiency -     VITAMIN D  25 Hydroxy (Vit-D Deficiency, Fractures)  Alkaline phosphatase elevation -     Alkaline Phosphatase, Isoenzymes  Cough Comments: chronic, worse since COVID. Nothing to suggest bacterial infection. suspect post-viral cough vs RAD. Treat with steroids, risks/SE reviewed. CXR given risks Orders: -     albuterol  (VENTOLIN  HFA) 108 (90 Base) MCG/ACT inhaler; INHALE 1-2 PUFFS BY MOUTH EVERY 6 HOURS AS NEEDED FOR WHEEZE OR SHORTNESS OF BREATH  Respiratory tract infection  Wheezing  Moderate persistent asthma without complication  Other orders -     glipiZIDE  (GLUCOTROL  XL) 5 MG 24 hr tablet; Take 1 tablet (5 mg total) by mouth daily with breakfast. -     Budeson-Glycopyrrol-Formoterol (BREZTRI  AEROSPHERE) 160-9-4.8 MCG/ACT AERO; Inhale 2 puffs into the lungs in the morning and at bedtime. -     sitaGLIPtin  (JANUVIA ) 50 MG tablet; Take 1 tablet (50 mg total) by mouth daily. -     predniSONE  (DELTASONE ) 20 MG tablet; 3 tablets today, 2 tablets tomorrow, 1 tablet the third day -     cefUROXime  (CEFTIN ) 500 MG tablet; Take 1 tablet (500 mg total) by mouth 2 (two) times daily with a meal for 10 days.    Follow up: pending labs

## 2023-06-30 NOTE — Patient Instructions (Signed)
 For cough and respiratory tract infection Drink plenty water such as 80 to 100 ounces of water daily Continue Mucinex  DM a few more days Begin Ceftin  antibiotic twice a day for a week Begin a 3-day course of prednisone  steroid to help reduce inflammation in the lungs Continue your Breztri  inhaler twice daily inhaler for prevention Continue albuterol  as needed every 4-6 hours for wheezing tightness and shortness of breath If much worse or not improving in the next 3 days let me know  Diabetes We confirmed today that you are no longer on metformin  Continue Januvia  daily I increased the dose of glipizide  to 5 mg XR daily so you can discontinue the 2.5 mg dose you have at home If you happen to see any blood sugar readings less than 60 in the coming month or 2 then let me know right away as we would go back to the 2.5 mg glipizide  We want to avoid low sugars or hypoglycemia  Continue the rest your medicines as usual  We will call with lab results   At your convenience call and schedule your mammogram and bone density test  We generally stop doing mammograms at age 51

## 2023-07-02 LAB — MICROALBUMIN / CREATININE URINE RATIO
Creatinine, Urine: 17.2 mg/dL
Microalb/Creat Ratio: 76 mg/g{creat} — ABNORMAL HIGH (ref 0–29)
Microalbumin, Urine: 13.1 ug/mL

## 2023-07-03 NOTE — Progress Notes (Signed)
 Still pending alkaline phosphatase lab  Rest of labs show cholesterol 2, blood counts okay, vitamin D  okay, electrolytes okay, kidney marker stable.  Continue with increased dose of glipizide  to 5 mg XL daily.  We recently increased dose from 2.5  Make sure you are taking your cholesterol pill atorvastatin  Lipitor daily as well as your other routine medicines daily  If not doing this already I recommend son helping to put all of her regular daily pills and a weekly pillbox to make it easier, rather than individual pill bottles being open every single day.  They may already be doing this.  Lets plan a follow-up in 4 months

## 2023-07-06 ENCOUNTER — Encounter (HOSPITAL_COMMUNITY): Payer: Self-pay

## 2023-07-06 ENCOUNTER — Emergency Department (HOSPITAL_COMMUNITY): Payer: Medicare HMO

## 2023-07-06 ENCOUNTER — Other Ambulatory Visit: Payer: Self-pay

## 2023-07-06 ENCOUNTER — Emergency Department (HOSPITAL_COMMUNITY)
Admission: EM | Admit: 2023-07-06 | Discharge: 2023-07-06 | Disposition: A | Discharge status: Home / Self Care | Payer: Medicare HMO | Attending: Emergency Medicine | Admitting: Emergency Medicine

## 2023-07-06 DIAGNOSIS — D259 Leiomyoma of uterus, unspecified: Secondary | ICD-10-CM | POA: Diagnosis not present

## 2023-07-06 DIAGNOSIS — N858 Other specified noninflammatory disorders of uterus: Secondary | ICD-10-CM | POA: Diagnosis not present

## 2023-07-06 DIAGNOSIS — R1084 Generalized abdominal pain: Secondary | ICD-10-CM | POA: Diagnosis not present

## 2023-07-06 DIAGNOSIS — R109 Unspecified abdominal pain: Secondary | ICD-10-CM | POA: Diagnosis not present

## 2023-07-06 DIAGNOSIS — R0689 Other abnormalities of breathing: Secondary | ICD-10-CM | POA: Diagnosis not present

## 2023-07-06 DIAGNOSIS — K449 Diaphragmatic hernia without obstruction or gangrene: Secondary | ICD-10-CM | POA: Diagnosis not present

## 2023-07-06 DIAGNOSIS — I10 Essential (primary) hypertension: Secondary | ICD-10-CM | POA: Diagnosis not present

## 2023-07-06 DIAGNOSIS — R064 Hyperventilation: Secondary | ICD-10-CM | POA: Diagnosis not present

## 2023-07-06 LAB — CBC WITH DIFFERENTIAL/PLATELET
Abs Immature Granulocytes: 0.1 10*3/uL — ABNORMAL HIGH (ref 0.00–0.07)
Basophils Absolute: 0.1 10*3/uL (ref 0.0–0.1)
Basophils Relative: 1 %
Eosinophils Absolute: 0.4 10*3/uL (ref 0.0–0.5)
Eosinophils Relative: 4 %
HCT: 37.5 % (ref 36.0–46.0)
Hemoglobin: 12.4 g/dL (ref 12.0–15.0)
Immature Granulocytes: 1 %
Lymphocytes Relative: 31 %
Lymphs Abs: 3.2 10*3/uL (ref 0.7–4.0)
MCH: 28.9 pg (ref 26.0–34.0)
MCHC: 33.1 g/dL (ref 30.0–36.0)
MCV: 87.4 fL (ref 80.0–100.0)
Monocytes Absolute: 1 10*3/uL (ref 0.1–1.0)
Monocytes Relative: 10 %
Neutro Abs: 5.5 10*3/uL (ref 1.7–7.7)
Neutrophils Relative %: 53 %
Platelets: 390 10*3/uL (ref 150–400)
RBC: 4.29 MIL/uL (ref 3.87–5.11)
RDW: 14.7 % (ref 11.5–15.5)
WBC: 10.4 10*3/uL (ref 4.0–10.5)
nRBC: 0 % (ref 0.0–0.2)

## 2023-07-06 LAB — URINALYSIS, W/ REFLEX TO CULTURE (INFECTION SUSPECTED)
Bilirubin Urine: NEGATIVE
Glucose, UA: NEGATIVE mg/dL
Hgb urine dipstick: NEGATIVE
Ketones, ur: NEGATIVE mg/dL
Nitrite: NEGATIVE
Protein, ur: NEGATIVE mg/dL
Specific Gravity, Urine: 1.003 — ABNORMAL LOW (ref 1.005–1.030)
pH: 7 (ref 5.0–8.0)

## 2023-07-06 LAB — COMPREHENSIVE METABOLIC PANEL
ALT: 40 U/L (ref 0–44)
AST: 33 U/L (ref 15–41)
Albumin: 4.4 g/dL (ref 3.5–5.0)
Alkaline Phosphatase: 111 U/L (ref 38–126)
Anion gap: 10 (ref 5–15)
BUN: 13 mg/dL (ref 8–23)
CO2: 26 mmol/L (ref 22–32)
Calcium: 9.3 mg/dL (ref 8.9–10.3)
Chloride: 101 mmol/L (ref 98–111)
Creatinine, Ser: 0.98 mg/dL (ref 0.44–1.00)
GFR, Estimated: >60 mL/min (ref >60)
Glucose, Bld: 84 mg/dL (ref 70–99)
Potassium: 3.4 mmol/L — ABNORMAL LOW (ref 3.5–5.1)
Sodium: 137 mmol/L (ref 135–145)
Total Bilirubin: 0.4 mg/dL (ref 0.0–1.2)
Total Protein: 8.3 g/dL — ABNORMAL HIGH (ref 6.5–8.1)

## 2023-07-06 LAB — LIPASE, BLOOD: Lipase: 140 U/L — ABNORMAL HIGH (ref 11–51)

## 2023-07-06 MED ORDER — KETOROLAC TROMETHAMINE 15 MG/ML IJ SOLN
15.0000 mg | Freq: Once | INTRAMUSCULAR | Status: AC
  Administered 2023-07-06: 15 mg via INTRAVENOUS
  Filled 2023-07-06: qty 1

## 2023-07-06 MED ORDER — ACETAMINOPHEN 500 MG PO TABS
1000.0000 mg | ORAL_TABLET | Freq: Once | ORAL | Status: AC
  Administered 2023-07-06: 1000 mg via ORAL
  Filled 2023-07-06: qty 2

## 2023-07-06 NOTE — Discharge Instructions (Addendum)
Return for any problem.  Take Tylenol for pain.

## 2023-07-06 NOTE — ED Provider Notes (Signed)
[facility] EMERGENCY DEPARTMENT AT [facility] Provider Note   CSN: [id number] Arrival date & time: 07/06/23  1341     History  Chief Complaint  Patient presents with   Abdominal Pain   Anxiety    Deborah Simon is a 73 y.o. female.  73 year old female with prior medical history as detailed below presents for evaluation.  Patient complains of diffuse right posterior flank pain.  Patient's pain began earlier today.  She denies any specific inciting event.  She denies associated nausea or vomiting.  She denies fever.  She denies abdominal pain.  She denies difficulty urinating or changes in her bowel movements.  The history is provided by the patient and medical records.       Home Medications Prior to Admission medications   Medication Sig Start Date End Date Taking? Authorizing Provider  albuterol (VENTOLIN HFA) 108 (90 Base) MCG/ACT inhaler INHALE 1-2 PUFFS BY MOUTH EVERY 6 HOURS AS NEEDED FOR WHEEZE OR SHORTNESS OF BREATH 06/30/23   Tysinger, Kermit Balo, PA-C  atorvastatin (LIPITOR) 40 MG tablet Take 1 tablet (40 mg total) by mouth daily. 06/30/23   Tysinger, Kermit Balo, PA-C  Blood Glucose Monitoring Suppl (ACCU-CHEK GUIDE ME) w/Device KIT TESTS BLOOD SUGARS ONCE A DAY. E11.9 06/17/22   Tysinger, Kermit Balo, PA-C  Budeson-Glycopyrrol-Formoterol (BREZTRI AEROSPHERE) 160-9-4.8 MCG/ACT AERO Inhale 2 puffs into the lungs in the morning and at bedtime. 06/30/23   Tysinger, Kermit Balo, PA-C  cefUROXime (CEFTIN) 500 MG tablet Take 1 tablet (500 mg total) by mouth 2 (two) times daily with a meal for 10 days. 06/30/23 07/10/23  Tysinger, Kermit Balo, PA-C  Cetirizine HCl (ZYRTEC PO) Take by mouth.    [provider]  citalopram (CELEXA) 20 MG tablet Take 1 tablet (20 mg total) by mouth daily. 12/26/22   Tysinger, Kermit Balo, PA-C  donepezil (ARICEPT) 5 MG tablet TAKE 1 TABLET BY MOUTH EVERYDAY AT BEDTIME 01/01/23   Butch Penny, NP  Famotidine (PEPCID PO) Take by mouth.    [provider]  glipiZIDE (GLUCOTROL XL) 5 MG 24 hr tablet Take 1 tablet (5 mg total) by mouth daily with breakfast. 06/30/23   Tysinger, Kermit Balo, PA-C  memantine (NAMENDA) 10 MG tablet TAKE 1 TABLET BY MOUTH TWICE A DAY 02/05/23   Butch Penny, NP  Multiple Vitamins-Minerals (MULTIVITAMIN WOMEN 50+ PO) Take 1 tablet by mouth daily.    [provider]  pantoprazole (PROTONIX) 40 MG tablet TAKE 1 TABLET DAILY 45 MIN PRIOR TO BREAKFAST 02/11/23   Tysinger, Kermit Balo, PA-C  predniSONE (DELTASONE) 20 MG tablet 3 tablets today, 2 tablets tomorrow, 1 tablet the third day 06/30/23   Tysinger, Kermit Balo, PA-C  primidone (MYSOLINE) 50 MG tablet Take 0.5 tablets (25 mg total) by mouth 2 (two) times daily. 05/19/23   Butch Penny, NP  sitaGLIPtin (JANUVIA) 50 MG tablet Take 1 tablet (50 mg total) by mouth daily. 06/30/23   Tysinger, Kermit Balo, PA-C  valsartan-hydrochlorothiazide (DIOVAN-HCT) 160-12.5 MG tablet TAKE 1 TABLET BY MOUTH EVERY DAY 04/21/23   Tysinger, Kermit Balo, PA-C  Vitamin D, Ergocalciferol, (DRISDOL) 1.25 MG (50000 UNIT) CAPS capsule Take 1 capsule (50,000 Units total) by mouth every 7 (seven) days. 12/26/22   Tysinger, Kermit Balo, PA-C      Allergies    Ace inhibitors, Aricept [donepezil], Azithromycin, Lisinopril, and Penicillins    Review of Systems   Review of Systems  All other systems reviewed and are negative.   Physical Exam Updated  Vital Signs BP (!) 179/79 (BP Location: Left Arm)   Pulse 86   Temp 98.2 F (36.8 C) (Oral)   Resp 16   LMP  (LMP Unknown)   SpO2 100%  Physical Exam Vitals and nursing note reviewed.  Constitutional:      General: She is not in acute distress.    Appearance: Normal appearance. She is well-developed.  HENT:     Head: Normocephalic and atraumatic.  Eyes:     Conjunctiva/sclera: Conjunctivae normal.     Pupils: Pupils are equal, round, and reactive to light.  Cardiovascular:     Rate and Rhythm: Normal rate and regular rhythm.     Heart  sounds: Normal heart sounds.  Pulmonary:     Effort: Pulmonary effort is normal. No respiratory distress.     Breath sounds: Normal breath sounds.  Abdominal:     General: There is no distension.     Palpations: Abdomen is soft.     Tenderness: There is no abdominal tenderness.  Genitourinary:    Comments: Mild right CVA tenderness. Musculoskeletal:        General: No deformity. Normal range of motion.     Cervical back: Normal range of motion and neck supple.  Skin:    General: Skin is warm and dry.  Neurological:     General: No focal deficit present.     Mental Status: She is alert and oriented to person, place, and time.     ED Results / Procedures / Treatments   Labs (all labs ordered are listed, but only abnormal results are displayed) Labs Reviewed  CBC WITH DIFFERENTIAL/PLATELET - Abnormal; Notable for the following components:      Result Value   Abs Immature Granulocytes 0.10 (*)    All other components within normal limits  COMPREHENSIVE METABOLIC PANEL - Abnormal; Notable for the following components:   Potassium 3.4 (*)    Total Protein 8.3 (*)    All other components within normal limits  URINALYSIS, W/ REFLEX TO CULTURE (INFECTION SUSPECTED) - Abnormal; Notable for the following components:   Color, Urine STRAW (*)    Specific Gravity, Urine 1.003 (*)    Leukocytes,Ua SMALL (*)    Bacteria, UA RARE (*)    All other components within normal limits  LIPASE, BLOOD - Abnormal; Notable for the following components:   Lipase 140 (*)    All other components within normal limits    EKG None  Radiology CT ABDOMEN PELVIS WO CONTRAST Result Date: 07/06/2023 CLINICAL DATA:  Right-sided flank pain EXAM: CT ABDOMEN AND PELVIS WITHOUT CONTRAST TECHNIQUE: Multidetector CT imaging of the abdomen and pelvis was performed following the standard protocol without IV contrast. RADIATION DOSE REDUCTION: This exam was performed according to the departmental dose-optimization  program which includes automated exposure control, adjustment of the mA and/or kV according to patient size and/or use of iterative reconstruction technique. COMPARISON:  CT 10/19/2019 FINDINGS: Lower chest: Lung bases demonstrate no acute airspace disease. Small hiatal hernia Hepatobiliary: No focal liver abnormality is seen. Status post cholecystectomy. No biliary dilatation. Pancreas: Unremarkable. No pancreatic ductal dilatation or surrounding inflammatory changes. Spleen: Normal in size without focal abnormality. Adrenals/Urinary Tract: Adrenal glands are normal. Kidneys show no hydronephrosis. The bladder is unremarkable Stomach/Bowel: The stomach is nonenlarged. There is no dilated small bowel. Negative appendix. Vascular/Lymphatic: Mild aortic atherosclerosis. No aneurysm. No suspicious lymph nodes Reproductive: Enlarged uterus with multiple calcified and noncalcified masses consistent with fibroids. No adnexal mass Other: Negative for  ascites or free air Musculoskeletal: No acute or suspicious osseous abnormality. Trace retrolisthesis L5 on S1 and grade 1 anterolisthesis L4 on L5 IMPRESSION: 1. No CT evidence for acute intra-abdominal or pelvic abnormality. Negative for hydronephrosis or ureteral stone. 2. Enlarged uterus with multiple fibroids. 3. Small hiatal hernia. 4. Aortic atherosclerosis. Aortic Atherosclerosis (ICD10-I70.0). Electronically Signed   By: Jasmine Pang M.D.   On: 07/06/2023 16:33    Procedures Procedures    Medications Ordered in ED Medications  acetaminophen (TYLENOL) tablet 1,000 mg (has no administration in time range)  ketorolac (TORADOL) 15 MG/ML injection 15 mg (has no administration in time range)    ED Course/ Medical Decision Making/ A&P                                 Medical Decision Making Amount and/or Complexity of Data Reviewed Labs: ordered. Radiology: ordered.  Risk OTC drugs. Prescription drug management.    Medical Screen Complete  This  patient presented to the ED with complaint of right flank pain.  This complaint involves an extensive number of treatment options. The initial differential diagnosis includes, but is not limited to, renal colic, muscular pain, other intra-abdominal pathology, metabolic abnormality, etc.  This presentation is: Acute, Self-Limited, Previously Undiagnosed, Uncertain Prognosis, Complicated, Systemic Symptoms, and Threat to Life/Bodily Function  Patient presenting with complaint of right flank posterior pain.  Patient appears to be in no significant distress on exam.  She does have mild right CVA tenderness.  Workup is reassuringly without significant abnormality.    Notably white count is 10.  Creatinine is 0.98.  Lipase is mildly elevated 140.    However, patient without abdominal pain consistent with pancreatitis.  Additionally CT imaging of the abdomen pelvis is without acute abnormality.    On reevaluation the patient is asymptomatic and feels much improved.  She and her family member are comfortable with plan for discharge.  Importance of close follow-up stressed.  Strict return precautions given understood. Additional history obtained:  Additional history obtained from [facility] External records from outside sources obtained and reviewed including prior ED visits and prior Inpatient records.    Lab Tests:  I ordered and personally interpreted labs.  The pertinent results include: CBC, CMP, lipase, UA   Imaging Studies ordered:  I ordered imaging studies including CT abdomen pelvis I independently visualized and interpreted obtained imaging which showed NAD I agree with the radiologist interpretation.   Cardiac Monitoring:  The patient was maintained on a cardiac monitor.  I personally viewed and interpreted the cardiac monitor which showed an underlying rhythm of: NSR   Medicines ordered:  I ordered medication including Tylenol Toradol for pain Reevaluation of the  patient after these medicines showed that the patient: resolved  Problem List / ED Course:  Right posterior flank pain   Reevaluation:  After the interventions noted above, I reevaluated the patient and found that they have: improved   Disposition:  After consideration of the diagnostic results and the patients response to treatment, I feel that the patent would benefit from close outpatient follow-up.          Final Clinical Impression(s) / ED Diagnoses Final diagnoses:  Flank pain    Rx / DC Orders ED Discharge Orders     None         Wynetta Fines, MD 07/06/23 2157

## 2023-07-06 NOTE — ED Notes (Signed)
Patient transported to CT 

## 2023-07-06 NOTE — ED Triage Notes (Signed)
Pt BIB EMS from home with complaints of abdominal pain and anxiety. Pt had a sharp pain to RUQ that has now resolved. Pt was also having an anxiety attack, resolved by breathing. Hx of dementia per baseline.

## 2023-07-07 ENCOUNTER — Telehealth: Payer: Self-pay

## 2023-07-07 NOTE — Transitions of Care (Post Inpatient/ED Visit) (Signed)
07/07/2023  Name: Deborah Simon MRN: [id number] DOB: Feb 15, 1951  Today's TOC FU Call Status: Today's TOC FU Call Status:: Successful TOC FU Call Completed TOC FU Call Complete Date: 07/07/23 Patient's Name and Date of Birth confirmed.  Transition Care Management Follow-up Telephone Call Date of Discharge: 07/06/23 Discharge Facility: [facility]) Type of Discharge: Emergency Department Reason for ED Visit: Other: (abd pain) How have you been since you were released from the hospital?: Better Any questions or concerns?: No  Items Reviewed: Did you receive and understand the discharge instructions provided?: Yes Medications obtained,verified, and reconciled?: Yes (Medications Reviewed) Any new allergies since your discharge?: No Dietary orders reviewed?: Yes Do you have support at home?: Yes People in Home: child(ren), adult  Medications Reviewed Today: Medications Reviewed Today     Reviewed by Karena Addison, LPN (Licensed Practical Nurse) on 07/07/23 at 1629  Med List Status: <None>   Medication Order Taking? Sig Documenting Provider Last Dose Status Informant  albuterol (VENTOLIN HFA) 108 (90 Base) MCG/ACT inhaler [id number]  INHALE 1-2 PUFFS BY MOUTH EVERY 6 HOURS AS NEEDED FOR WHEEZE OR SHORTNESS OF BREATH Tysinger, Kermit Balo, PA-C  Active   atorvastatin (LIPITOR) 40 MG tablet [id number]  Take 1 tablet (40 mg total) by mouth daily. Tysinger, Kermit Balo, PA-C  Active   Blood Glucose Monitoring Suppl (ACCU-CHEK GUIDE ME) w/Device KIT [id number] No TESTS BLOOD SUGARS ONCE A DAY. E11.9 TysingerKermit Balo, PA-C Taking Active   Budeson-Glycopyrrol-Formoterol (BREZTRI AEROSPHERE) 160-9-4.8 MCG/ACT Sandrea Matte [id number]  Inhale 2 puffs into the lungs in the morning and at bedtime. Tysinger, Kermit Balo, PA-C  Active   cefUROXime (CEFTIN) 500 MG tablet [id number]  Take 1 tablet (500 mg total) by mouth 2 (two) times daily with a meal for 10 days. Tysinger, Kermit Balo, PA-C  Active    Cetirizine HCl (ZYRTEC PO) [id number] No Take by mouth. [provider] Taking Active Family Member  citalopram (CELEXA) 20 MG tablet [id number] No Take 1 tablet (20 mg total) by mouth daily. Tysinger, Kermit Balo, PA-C Taking Active   donepezil (ARICEPT) 5 MG tablet [id number] No TAKE 1 TABLET BY MOUTH EVERYDAY AT BEDTIME Butch Penny, NP Taking Active   Famotidine (PEPCID PO) [id number] No Take by mouth. [provider] Taking Active   glipiZIDE (GLUCOTROL XL) 5 MG 24 hr tablet [id number]  Take 1 tablet (5 mg total) by mouth daily with breakfast. Jac Canavan, PA-C  Active   memantine (NAMENDA) 10 MG tablet [id number] No TAKE 1 TABLET BY MOUTH TWICE A DAY Millikan, Megan, NP Taking Active   Multiple Vitamins-Minerals (MULTIVITAMIN WOMEN 50+ PO) [id number] No Take 1 tablet by mouth daily. [provider] Taking Active Family Member  pantoprazole (PROTONIX) 40 MG tablet [id number] No TAKE 1 TABLET DAILY 45 MIN PRIOR TO BREAKFAST Tysinger, Kermit Balo, PA-C Taking Active   predniSONE (DELTASONE) 20 MG tablet [id number]  3 tablets today, 2 tablets tomorrow, 1 tablet the third day Genia Del  Active   primidone (MYSOLINE) 50 MG tablet [id number] No Take 0.5 tablets (25 mg total) by mouth 2 (two) times daily. Butch Penny, NP Taking Active   sitaGLIPtin (JANUVIA) 50 MG tablet [id number]  Take 1 tablet (50 mg total) by mouth daily. Jac Canavan, PA-C  Active   valsartan-hydrochlorothiazide (DIOVAN-HCT) 160-12.5 MG tablet [id number] No TAKE 1 TABLET BY MOUTH EVERY DAY Tysinger, Kermit Balo, PA-C Taking Active   Vitamin D, Ergocalciferol, (DRISDOL) 1.25 MG (50000 UNIT) CAPS capsule [id number]  No Take 1 capsule (50,000 Units total) by mouth every 7 (seven) days. Tysinger, Kermit Balo, PA-C Taking Active             Home Care and Equipment/Supplies: Were Home Health Services Ordered?: NA Any new equipment or medical supplies ordered?: NA  Functional Questionnaire: Do you  need assistance with bathing/showering or dressing?: No Do you need assistance with meal preparation?: No Do you need assistance with eating?: No Do you have difficulty maintaining continence: No Do you need assistance with getting out of bed/getting out of a chair/moving?: No Do you have difficulty managing or taking your medications?: No  Follow up appointments reviewed: PCP Follow-up appointment confirmed?: NA Specialist Hospital Follow-up appointment confirmed?: NA Do you need transportation to your follow-up appointment?: No Do you understand care options if your condition(s) worsen?: Yes-patient verbalized understanding    SIGNATURE Karena Addison, LPN [facility] Nurse Health Advisor Direct Dial [phone]

## 2023-07-11 LAB — COMPREHENSIVE METABOLIC PANEL
ALT: 35 [IU]/L — ABNORMAL HIGH (ref 0–32)
AST: 40 [IU]/L (ref 0–40)
Albumin: 4.6 g/dL (ref 3.8–4.8)
Alkaline Phosphatase: 151 [IU]/L — ABNORMAL HIGH (ref 44–121)
BUN/Creatinine Ratio: 8 — ABNORMAL LOW (ref 12–28)
BUN: 8 mg/dL (ref 8–27)
Bilirubin Total: 0.2 mg/dL (ref 0.0–1.2)
CO2: 24 mmol/L (ref 20–29)
Calcium: 10 mg/dL (ref 8.7–10.3)
Chloride: 99 mmol/L (ref 96–106)
Creatinine, Ser: 1.05 mg/dL — ABNORMAL HIGH (ref 0.57–1.00)
Globulin, Total: 3.2 g/dL (ref 1.5–4.5)
Glucose: 79 mg/dL (ref 70–99)
Potassium: 4.6 mmol/L (ref 3.5–5.2)
Sodium: 139 mmol/L (ref 134–144)
Total Protein: 7.8 g/dL (ref 6.0–8.5)
eGFR: 56 mL/min/{1.73_m2} — ABNORMAL LOW (ref >59)

## 2023-07-11 LAB — LIPID PANEL
Chol/HDL Ratio: 3.4 {ratio} (ref 0.0–4.4)
Cholesterol, Total: 217 mg/dL — ABNORMAL HIGH (ref 100–199)
HDL: 63 mg/dL (ref >39)
LDL Chol Calc (NIH): 139 mg/dL — ABNORMAL HIGH (ref 0–99)
Triglycerides: 84 mg/dL (ref 0–149)
VLDL Cholesterol Cal: 15 mg/dL (ref 5–40)

## 2023-07-11 LAB — VITAMIN D 25 HYDROXY (VIT D DEFICIENCY, FRACTURES): Vit D, 25-Hydroxy: 83.7 ng/mL (ref 30.0–100.0)

## 2023-07-11 LAB — CBC
Hematocrit: 36.7 % (ref 34.0–46.6)
Hemoglobin: 12.1 g/dL (ref 11.1–15.9)
MCH: 29.2 pg (ref 26.6–33.0)
MCHC: 33 g/dL (ref 31.5–35.7)
MCV: 89 fL (ref 79–97)
Platelets: 436 10*3/uL (ref 150–450)
RBC: 4.14 x10E6/uL (ref 3.77–5.28)
RDW: 14.4 % (ref 11.7–15.4)
WBC: 9.2 10*3/uL (ref 3.4–10.8)

## 2023-07-11 LAB — ALKALINE PHOSPHATASE, ISOENZYMES
BONE FRACTION: 31 % (ref 14–68)
INTESTINAL FRAC.: 1 % (ref 0–18)
LIVER FRACTION: 68 % (ref 18–85)

## 2023-07-22 ENCOUNTER — Other Ambulatory Visit: Payer: Self-pay | Admitting: Adult Health

## 2023-07-30 ENCOUNTER — Ambulatory Visit: Payer: Medicare HMO | Admitting: Medical

## 2023-07-30 VITALS — BP 140/80 | HR 91 | Temp 98.4°F | Wt 193.2 lb

## 2023-07-30 DIAGNOSIS — R942 Abnormal results of pulmonary function studies: Secondary | ICD-10-CM | POA: Diagnosis not present

## 2023-07-30 DIAGNOSIS — R052 Subacute cough: Secondary | ICD-10-CM | POA: Diagnosis not present

## 2023-07-30 DIAGNOSIS — R053 Chronic cough: Secondary | ICD-10-CM | POA: Diagnosis not present

## 2023-07-30 DIAGNOSIS — J4530 Mild persistent asthma, uncomplicated: Secondary | ICD-10-CM | POA: Diagnosis not present

## 2023-07-30 DIAGNOSIS — K219 Gastro-esophageal reflux disease without esophagitis: Secondary | ICD-10-CM | POA: Diagnosis not present

## 2023-07-30 MED ORDER — BENZONATATE 200 MG PO CAPS: 200.0000 mg | ORAL_CAPSULE | Freq: Three times a day (TID) | ORAL | 0 refills | Status: DC | PRN

## 2023-07-30 MED ORDER — ALBUTEROL SULFATE (2.5 MG/3ML) 0.083% IN NEBU
2.5000 mg | INHALATION_SOLUTION | Freq: Once | RESPIRATORY_TRACT | Status: AC
  Administered 2023-07-30: 2.5 mg via RESPIRATORY_TRACT

## 2023-07-30 MED ORDER — ALBUTEROL SULFATE (2.5 MG/3ML) 0.083% IN NEBU: 2.5000 mg | INHALATION_SOLUTION | Freq: Four times a day (QID) | RESPIRATORY_TRACT | 1 refills | Status: AC | PRN

## 2023-07-30 NOTE — Patient Instructions (Signed)
The main findings on exam today is some swelling in the nasal turbinates  Recommendations: Consider using over-the-counter Flonase/fluticasone nasal spray once daily for the next 2 weeks Begin Tessalon Perle cough drops up to 3 times a day.  Caution with sedation Drink plenty of water throughout the day Continue her usual acid reflux medicine In the event acid reflux is aggravating the cough, avoid spicy and acidic foods in the short-term, avoid greasy and fried foods, large portions and avoid eating fast Go for chest x-ray If any congestion or drainage you can use a little Zyrtec or Benadryl at night for the next week We will call x-ray results No obvious symptoms today to suggest flu or other new illness Lets avoid additional prednisone as this will run of her blood sugars Continue Breztri inhaler twice daily Continue albuterol rescue inhaler 2 puffs every 4-6 hours as needed for shortness of breath and wheezing and coughing fits  Please go to [facility] for your chest xray.   Their hours are 8am - 4:30 pm Monday - Friday.  Take your insurance card with you.  [facility] [phone]  [address] [place], [place] [address]   I have also placed referral to pulmonology/lung doctor.  She has history of abnormal breathing test and history of asthma.  Given that she still has cough despite being on current inhalers, less refer for updated spirometry and evaluation.

## 2023-07-30 NOTE — Progress Notes (Signed)
Subjective:  Deborah Simon is a 73 y.o. female who presents for Chief Complaint  Patient presents with   Cough    Cough- did antibiotics and prednisone but still has a bad cough. Using inhalers more often, SOB     Here with son.  I saw her about a month ago.  She continues to have a cough.  At her last visit she did a round of antibiotic and prednisone and did get some better for about a week or 2 and then now the cough has persisted.  She does have some shortness of breath.  Cough is throughout the day and night.  She is using her Breztri inhaler and using albuterol some.  No current congestion or drainage.  No fever, no vomiting, no chills, no body aches.  No sore throat, no facial pressure, no ear pain.  She does have a lot of GERD issues, burping, has trouble with digestion sometimes.  She has a history of gallbladder removal.  She has history of asthma  No other aggravating or relieving factors.    No other c/o.  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 90 tablet 2   Blood Glucose Monitoring Suppl (ACCU-CHEK GUIDE ME) w/Device KIT TESTS BLOOD SUGARS ONCE A DAY. E11.9 1 kit 0   Budeson-Glycopyrrol-Formoterol (BREZTRI AEROSPHERE) 160-9-4.8 MCG/ACT AERO Inhale 2 puffs into the lungs in the morning and at bedtime. 10.7 each 5   Cetirizine HCl (ZYRTEC PO) Take by mouth.     citalopram (CELEXA) 20 MG tablet Take 1 tablet (20 mg total) by mouth daily. 90 tablet 3   donepezil (ARICEPT) 5 MG tablet TAKE 1 TABLET BY MOUTH EVERYDAY AT  BEDTIME 90 tablet 1   Famotidine (PEPCID PO) Take by mouth.     glipiZIDE (GLUCOTROL XL) 5 MG 24 hr tablet Take 1 tablet (5 mg total) by mouth daily with breakfast. 90 tablet 1   memantine (NAMENDA) 10 MG tablet TAKE 1 TABLET BY MOUTH TWICE A DAY 180 tablet 1   Multiple Vitamins-Minerals (MULTIVITAMIN WOMEN 50+ PO) Take 1 tablet by mouth daily.     pantoprazole (PROTONIX) 40 MG tablet TAKE 1 TABLET DAILY 45 MIN PRIOR TO BREAKFAST 90 tablet 1   primidone (MYSOLINE) 50 MG tablet Take 0.5 tablets (25 mg total) by mouth 2 (two) times daily. 90 tablet 2   sitaGLIPtin (JANUVIA) 50 MG tablet Take 1 tablet (50 mg total) by mouth daily. 90 tablet 0   valsartan-hydrochlorothiazide (DIOVAN-HCT) 160-12.5 MG tablet TAKE 1 TABLET BY MOUTH EVERY DAY 90 tablet 1   Vitamin D, Ergocalciferol, (DRISDOL) 1.25 MG (50000 UNIT) CAPS capsule Take 1 capsule (50,000 Units total) by mouth every 7 (seven) days. 12 capsule 3   No current facility-administered medications on file prior to visit.    The following portions of the patient's history were reviewed and updated as appropriate: allergies, current medications, past family history, past medical history, past social history, past surgical history and problem list.  ROS Otherwise as  in subjective above    Objective: BP (!) 140/80   Pulse 91   Temp 98.4 F (36.9 C)   Wt 193 lb 3.2 oz (87.6 kg)   LMP  (LMP Unknown)   SpO2 98%   BMI 36.50 kg/m  Wt Readings from Last 3 Encounters:  07/30/23 193 lb 3.2 oz (87.6 kg)  06/30/23 188 lb 3.2 oz (85.4 kg)  05/19/23 187 lb (84.8 kg)   General appearance: alert, no distress, well developed, well nourished HEENT: normocephalic, sclerae anicteric, conjunctiva pink and moist, TMs pearly, nares with turbinate edema and mild erythema erythema, pharynx normal Oral cavity: MMM, no lesions Neck: supple, no lymphadenopathy, no thyromegaly, no masses Heart: RRR, normal S1, S2, no murmurs Lungs: CTA bilaterally, no wheezes,  rhonchi, or rales Pulses: 2+ radial pulses, 2+ pedal pulses, normal cap refill Ext: no edema    Assessment: Encounter Diagnoses  Name Primary?   Chronic cough Yes   Subacute cough    Mild persistent asthma without complication    Gastroesophageal reflux disease, unspecified whether esophagitis present    Abnormal PFT      Plan: The main findings on exam today is some swelling in the nasal turbinates  Recommendations: Consider using over-the-counter Flonase/fluticasone nasal spray once daily for the next 2 weeks Begin Tessalon Perle cough drops up to 3 times a day.  Caution with sedation Drink plenty of water throughout the day Continue her usual acid reflux medicine In the event acid reflux is aggravating the cough, avoid spicy and acidic foods in the short-term, avoid greasy and fried foods, large portions and avoid eating fast Go for chest x-ray If any congestion or drainage you can use a little Zyrtec or Benadryl at night for the next week We will call x-ray results No obvious symptoms today to suggest flu or other new illness Lets avoid additional prednisone as this will run of her blood sugars Continue Breztri inhaler twice daily Continue albuterol rescue inhaler 2 puffs every 4-6 hours as needed for shortness of breath and wheezing and coughing fits  Please go to [facility] for your chest xray.   Their hours are 8am - 4:30 pm Monday - Friday.  Take your insurance card with you.  [facility] [phone]  [address] [place], [place] [address]   I have also placed referral to pulmonology/lung doctor.  She has history of abnormal breathing test and history of asthma.  Given that she still has cough despite being on current inhalers, less refer for updated spirometry and evaluation.    Shakeria was seen today for cough.  Diagnoses and all orders for this visit:  Chronic cough  Subacute cough -     DG Chest 2 View; Future -     Ambulatory  referral to Pulmonology  Mild persistent asthma without complication  Gastroesophageal reflux disease, unspecified whether esophagitis present  Abnormal PFT  Other orders -     benzonatate (TESSALON) 200 MG capsule; Take 1 capsule (200 mg total) by mouth 3 (three) times daily as needed for cough.    Follow up: cxr and referral

## 2023-07-30 NOTE — Addendum Note (Signed)
Addended by: Herminio Commons A on: 07/30/2023 12:17 PM   Modules accepted: Orders

## 2023-08-06 ENCOUNTER — Encounter (HOSPITAL_BASED_OUTPATIENT_CLINIC_OR_DEPARTMENT_OTHER): Payer: Self-pay

## 2023-08-09 ENCOUNTER — Other Ambulatory Visit: Payer: Self-pay | Admitting: Adult Health

## 2023-08-20 ENCOUNTER — Other Ambulatory Visit: Payer: Self-pay | Admitting: Medical

## 2023-08-20 DIAGNOSIS — R059 Cough, unspecified: Secondary | ICD-10-CM

## 2023-09-03 ENCOUNTER — Encounter: Payer: Self-pay | Admitting: Medical

## 2023-09-03 DIAGNOSIS — H25813 Combined forms of age-related cataract, bilateral: Secondary | ICD-10-CM | POA: Diagnosis not present

## 2023-09-03 DIAGNOSIS — H353131 Nonexudative age-related macular degeneration, bilateral, early dry stage: Secondary | ICD-10-CM | POA: Diagnosis not present

## 2023-09-03 DIAGNOSIS — E1136 Type 2 diabetes mellitus with diabetic cataract: Secondary | ICD-10-CM | POA: Diagnosis not present

## 2023-09-03 LAB — HM DIABETES EYE EXAM

## 2023-09-04 NOTE — Progress Notes (Signed)
 Abstract the diabetic eye exam

## 2023-09-30 ENCOUNTER — Other Ambulatory Visit: Payer: Self-pay | Admitting: Medical

## 2023-09-30 DIAGNOSIS — R059 Cough, unspecified: Secondary | ICD-10-CM

## 2023-10-18 ENCOUNTER — Other Ambulatory Visit: Payer: Self-pay | Admitting: Medical

## 2023-11-03 ENCOUNTER — Ambulatory Visit: Payer: Medicare HMO | Admitting: Medical

## 2023-12-13 ENCOUNTER — Other Ambulatory Visit: Payer: Self-pay | Admitting: Medical

## 2023-12-14 ENCOUNTER — Other Ambulatory Visit: Payer: Self-pay | Admitting: Medical

## 2023-12-14 DIAGNOSIS — R059 Cough, unspecified: Secondary | ICD-10-CM

## 2023-12-19 ENCOUNTER — Other Ambulatory Visit: Payer: Self-pay | Admitting: Medical

## 2023-12-21 NOTE — Telephone Encounter (Signed)
Appt in September.

## 2023-12-23 ENCOUNTER — Ambulatory Visit (INDEPENDENT_AMBULATORY_CARE_PROVIDER_SITE_OTHER): Admitting: Medical

## 2023-12-23 VITALS — BP 160/90 | HR 103 | Temp 98.6°F | Wt 190.0 lb

## 2023-12-23 DIAGNOSIS — M79671 Pain in right foot: Secondary | ICD-10-CM

## 2023-12-23 DIAGNOSIS — F0390 Unspecified dementia without behavioral disturbance: Secondary | ICD-10-CM | POA: Diagnosis not present

## 2023-12-23 DIAGNOSIS — E1122 Type 2 diabetes mellitus with diabetic chronic kidney disease: Secondary | ICD-10-CM | POA: Diagnosis not present

## 2023-12-23 DIAGNOSIS — I10 Essential (primary) hypertension: Secondary | ICD-10-CM | POA: Diagnosis not present

## 2023-12-23 DIAGNOSIS — M7989 Other specified soft tissue disorders: Secondary | ICD-10-CM | POA: Diagnosis not present

## 2023-12-23 MED ORDER — COLCHICINE 0.6 MG PO TABS: 0.6000 mg | ORAL_TABLET | Freq: Two times a day (BID) | ORAL | 0 refills | Status: AC

## 2023-12-23 MED ORDER — IBUPROFEN 600 MG PO TABS
600.0000 mg | ORAL_TABLET | Freq: Two times a day (BID) | ORAL | 0 refills | Status: AC
Start: 2023-12-23 — End: ?

## 2023-12-23 NOTE — Patient Instructions (Signed)
 Recommendations Short-term begin some medication to help with joint pain and swelling, possible gout Begin ibuprofen  twice daily with food for the next 5 to 7 days.  This is prescription 600 mg Begin colchicine  0.6 mg twice a day for the next 5 to 7 days.  This is a specific gout medication to reduce inflammation She can do some over-the-counter Tylenol  325 mg or 500 mg but alternate with the ibuprofen  by at least 4 hours.  Do not take any more than 2 ibuprofen  or 2 Tylenol  in a day Lets start with this medication regimen If no significant improvement in the next 48 hours send me a MyChart message or call back.  I could possibly send out a stronger pain medicine but we want to limit that if possible because of sedation and fall risk She needs to be drinking at least 60 to 80 ounces of water daily I would avoid sugary drinks in general.  If she absolutely just craves Sprite, try Sprite 0 or a diet version Have her rest the foot the next few days, so no excess walking or moving around the next few days to give it a chance to rest I would elevate the leg if possible the next few days on the foot rest to help with some of the swelling Lets recheck in about a week to 10 days on both the foot and blood pressure Continue the same medicines for now otherwise If either one of the medicines are not covered by insurance or too expensive let me know right away

## 2023-12-23 NOTE — Progress Notes (Signed)
 Subjective:  Deborah Simon is a 73 y.o. female who presents for Chief Complaint  Patient presents with   Acute Visit    Right foot pain, warm to touch and painful, swelling started Monday, BP has been running high as well     Here with caregiver, Reena her daughter-in-law.  She notes about 3-day history of right foot pain and swelling.  The foot feels warm to touch.  She denies any injury trauma or fall.  Her medicines are dispensed and given to her daily.  Her blood pressure has been high as well the last several days.  She denies chest pain, blurred vision, headache.  She has dementia so her memory is not good.  No history of gout.  No recent fever.  No redness of the foot.  She does drink a fair amount of Sprite  No other aggravating or relieving factors.    No other c/o.  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 150 mL 1   albuterol  (VENTOLIN  HFA) 108 (90 Base) MCG/ACT inhaler INHALE 1-2 PUFFS BY MOUTH EVERY 6 HOURS AS NEEDED FOR WHEEZE OR SHORTNESS OF BREATH 8.5 each 0   atorvastatin  (LIPITOR) 40 MG tablet Take 1 tablet (40 mg total) by mouth daily. 90 tablet 2   Budeson-Glycopyrrol-Formoterol (BREZTRI  AEROSPHERE) 160-9-4.8 MCG/ACT AERO Inhale 2 puffs into the lungs in the morning and at bedtime. 10.7 each 5   Cetirizine HCl (ZYRTEC PO) Take by mouth.     citalopram  (CELEXA ) 20 MG tablet Take 1 tablet (20 mg total) by mouth daily. 90 tablet 3   Famotidine (PEPCID PO) Take by mouth.     glipiZIDE  (GLUCOTROL  XL) 5 MG 24 hr tablet TAKE 1 TABLET BY MOUTH EVERY DAY WITH BREAKFAST 90  tablet 0   JANUVIA  50 MG tablet TAKE 1 TABLET BY MOUTH EVERY DAY 90 tablet 0   Multiple Vitamins-Minerals (MULTIVITAMIN WOMEN 50+ PO) Take 1 tablet by mouth daily.     pantoprazole  (PROTONIX ) 40 MG tablet TAKE 1 TABLET DAILY 45 MIN PRIOR TO BREAKFAST 90 tablet 1   valsartan -hydrochlorothiazide  (DIOVAN -HCT) 160-12.5 MG tablet TAKE 1 TABLET BY MOUTH EVERY DAY 90 tablet 1   benzonatate  (TESSALON ) 200 MG capsule Take 1 capsule (200 mg total) by mouth 3 (three) times daily as needed for cough. 30 capsule 0   Blood Glucose Monitoring Suppl (ACCU-CHEK GUIDE ME) w/Device KIT TESTS BLOOD SUGARS ONCE A DAY. E11.9 1 kit 0   donepezil  (ARICEPT ) 5 MG tablet TAKE 1 TABLET BY MOUTH EVERYDAY AT BEDTIME (Patient not taking: Reported on 12/23/2023) 90 tablet 1   memantine  (NAMENDA ) 10 MG tablet TAKE 1 TABLET BY MOUTH TWICE A DAY (Patient not taking: Reported on 12/23/2023) 180 tablet 1   primidone  (MYSOLINE ) 50 MG tablet Take 0.5 tablets (25 mg total) by mouth 2 (two) times daily. (Patient not taking: Reported on 12/23/2023) 90 tablet 2   Vitamin D , Ergocalciferol , (DRISDOL ) 1.25 MG (50000 UNIT) CAPS capsule Take 1 capsule (50,000 Units total) by mouth every 7 (seven) days. (Patient not taking: Reported on 12/23/2023) 12 capsule 3  No current facility-administered medications on file prior to visit.    The following portions of the patient's history were reviewed and updated as appropriate: allergies, current medications, past family history, past medical history, past social history, past surgical history and problem list.  ROS Otherwise as in subjective above    Objective: BP (!) 160/90   Pulse (!) 103   Temp 98.6 F (37 C)   Wt 190 lb (86.2 kg)   LMP  (LMP Unknown)   SpO2 98%   BMI 35.90 kg/m   General appearance: alert, no distress, well developed, well nourished Neck: supple, no lymphadenopathy, no thyromegaly, no masses Heart: RRR, normal S1, S2, no murmurs Lungs: CTA bilaterally, no wheezes,  rhonchi, or rales Pulses: 2+ radial pulses, 2+ pedal pulses, normal cap refill Right foot with some generalized swelling mostly over the distal foot and midfoot.  She is tender over the right MTP and midfoot including 1st through 3rd toes.  There is no redness.  There is some slight warmth to the foot but no hot to touch. Range of motion of great there is seems a little decreased.  Rest of foot nontender.  Ankle seems normal range of motion without pain.  Knee unremarkable.    Assessment: Encounter Diagnoses  Name Primary?   Right foot pain Yes   Swelling of right foot    Essential hypertension    Type 2 diabetes mellitus with chronic kidney disease, without long-term current use of insulin, unspecified CKD stage (HCC)    Neurodegenerative dementia (HCC)      Plan: We discussed symptoms and exam findings.  No sign of infection or cellulitis.  Symptoms and exam with suggest gout or other inflammation.  Blood pressure could be elevated given her pain in the last few days.  We discussed the following recommendations.  Recommendations Short-term begin some medication to help with joint pain and swelling, possible gout Begin ibuprofen  twice daily with food for the next 5 to 7 days.  This is prescription 600 mg Begin colchicine  0.6 mg twice a day for the next 5 to 7 days.  This is a specific gout medication to reduce inflammation She can do some over-the-counter Tylenol  325 mg or 500 mg but alternate with the ibuprofen  by at least 4 hours.  Do not take any more than 2 ibuprofen  or 2 Tylenol  in a day Lets start with this medication regimen If no significant improvement in the next 48 hours send me a MyChart message or call back.  I could possibly send out a stronger pain medicine but we want to limit that if possible because of sedation and fall risk She needs to be drinking at least 60 to 80 ounces of water daily I would avoid sugary drinks in general.  If she absolutely just craves  Sprite, try Sprite 0 or a diet version Have her rest the foot the next few days, so no excess walking or moving around the next few days to give it a chance to rest I would elevate the leg if possible the next few days on the foot rest to help with some of the swelling Lets recheck in about a week to 10 days on both the foot and blood pressure Continue the same medicines for now otherwise If either one of the medicines are not covered by insurance or too expensive let me know right away  Telesa was seen today for acute visit.  Diagnoses and all orders for this visit:  Right foot  pain  Swelling of right foot  Essential hypertension  Type 2 diabetes mellitus with chronic kidney disease, without long-term current use of insulin, unspecified CKD stage (HCC)  Neurodegenerative dementia (HCC)  Other orders -     colchicine  0.6 MG tablet; Take 1 tablet (0.6 mg total) by mouth 2 (two) times daily. -     ibuprofen  (ADVIL ) 600 MG tablet; Take 1 tablet (600 mg total) by mouth 2 (two) times daily.    Follow up: 1wk

## 2023-12-30 ENCOUNTER — Ambulatory Visit (INDEPENDENT_AMBULATORY_CARE_PROVIDER_SITE_OTHER): Admitting: Medical

## 2023-12-30 VITALS — BP 155/86 | HR 81 | Wt 186.6 lb

## 2023-12-30 DIAGNOSIS — G252 Other specified forms of tremor: Secondary | ICD-10-CM

## 2023-12-30 DIAGNOSIS — R413 Other amnesia: Secondary | ICD-10-CM

## 2023-12-30 DIAGNOSIS — Z79899 Other long term (current) drug therapy: Secondary | ICD-10-CM | POA: Insufficient documentation

## 2023-12-30 DIAGNOSIS — I10 Essential (primary) hypertension: Secondary | ICD-10-CM

## 2023-12-30 DIAGNOSIS — M7989 Other specified soft tissue disorders: Secondary | ICD-10-CM

## 2023-12-30 DIAGNOSIS — E1122 Type 2 diabetes mellitus with diabetic chronic kidney disease: Secondary | ICD-10-CM

## 2023-12-30 DIAGNOSIS — E785 Hyperlipidemia, unspecified: Secondary | ICD-10-CM

## 2023-12-30 DIAGNOSIS — E1169 Type 2 diabetes mellitus with other specified complication: Secondary | ICD-10-CM | POA: Diagnosis not present

## 2023-12-30 NOTE — Progress Notes (Signed)
 Subjective:  Deborah Simon is a 73 y.o. female who presents for Chief Complaint  Patient presents with   Follow-up    1 week follow-up on foot pain and BP, bp is still staying high. This morning was 185/92, yesterday 143/118, Monday morning 158/108, Sunday morning was 193/97.      Here with caregiver, Deborah Simon her daughter-in-law.  I saw her a week ago for foot pain and swelling.  After using colchicine  and some ibuprofen  the foot is much better.  She does have some mild swelling now.  Last visit her pressure was elevated.  They have been monitoring her blood pressures and they are continuing to be elevated.  She is compliant with her medications in general.  Her daughter-in-law Deborah Simon manages the medications.  They decided to stop her primidone  as it seemed to make her tremors worse.  Since stopping the medication her tremors have been fine  They also do not feel any benefit on the Namenda  and Aricept  so they discontinued meds.  They do have follow-up planned with neurology.  They were worried about her pill burden as she is on a lot of medications.  Last visit they cut out Sprite.  She has not had any more Sprite  No other aggravating or relieving factors.    No other c/o.  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 150 mL 1   albuterol  (VENTOLIN  HFA) 108 (90 Base) MCG/ACT inhaler INHALE 1-2 PUFFS BY MOUTH EVERY 6 HOURS AS NEEDED FOR WHEEZE OR SHORTNESS OF BREATH 8.5 each 0   atorvastatin  (LIPITOR) 40 MG tablet Take 1 tablet (40 mg total) by mouth daily. 90 tablet 2   Blood Glucose  Monitoring Suppl (ACCU-CHEK GUIDE ME) w/Device KIT TESTS BLOOD SUGARS ONCE A DAY. E11.9 1 kit 0   Budeson-Glycopyrrol-Formoterol (BREZTRI  AEROSPHERE) 160-9-4.8 MCG/ACT AERO Inhale 2 puffs into the lungs in the morning and at bedtime. 10.7 each 5   Cetirizine HCl (ZYRTEC PO) Take by mouth.     citalopram  (CELEXA ) 20 MG tablet Take 1 tablet (20 mg total) by mouth daily. 90 tablet 3   colchicine  0.6 MG tablet Take 1 tablet (0.6 mg total) by mouth 2 (two) times daily. 20 tablet 0   Famotidine (PEPCID PO) Take by mouth.     glipiZIDE  (GLUCOTROL  XL) 5 MG 24 hr tablet TAKE 1 TABLET BY MOUTH EVERY DAY WITH BREAKFAST 90 tablet 0   ibuprofen  (ADVIL ) 600 MG tablet Take 1 tablet (600 mg total) by mouth 2 (two) times daily. 30 tablet 0   JANUVIA  50 MG tablet TAKE 1 TABLET BY MOUTH EVERY DAY 90 tablet 0   donepezil  (ARICEPT ) 5 MG tablet TAKE 1 TABLET BY MOUTH EVERYDAY AT BEDTIME (Patient not taking: Reported on 12/30/2023) 90 tablet 1   memantine  (NAMENDA ) 10 MG tablet TAKE 1 TABLET BY MOUTH TWICE A DAY (Patient not taking: Reported on 12/30/2023) 180 tablet 1   Multiple Vitamins-Minerals (MULTIVITAMIN WOMEN 50+ PO) Take 1 tablet by mouth daily.     pantoprazole  (PROTONIX ) 40 MG tablet TAKE 1 TABLET DAILY 45 MIN PRIOR TO  BREAKFAST 90 tablet 1   valsartan -hydrochlorothiazide  (DIOVAN -HCT) 160-12.5 MG tablet TAKE 1 TABLET BY MOUTH EVERY DAY 90 tablet 1   No current facility-administered medications on file prior to visit.    The following portions of the patient's history were reviewed and updated as appropriate: allergies, current medications, past family history, past medical history, past social history, past surgical history and problem list.  ROS Otherwise as in subjective above    Objective: BP (!) 155/86 (BP Location: Right Arm, Cuff Size: Large)   Pulse 81   Wt 186 lb 9.6 oz (84.6 kg)   LMP  (LMP Unknown)   SpO2 99%   BMI 35.26 kg/m   Wt Readings from Last 3 Encounters:  12/30/23 186 lb 9.6  oz (84.6 kg)  12/23/23 190 lb (86.2 kg)  07/30/23 193 lb 3.2 oz (87.6 kg)   BP Readings from Last 3 Encounters:  12/30/23 (!) 155/86  12/23/23 (!) 160/90  07/30/23 (!) 140/80    General appearance: alert, no distress, well developed, well nourished Neck: supple, no lymphadenopathy, no thyromegaly, no masses, no JVD Heart: RRR, normal S1, S2, no murmurs Lungs: CTA bilaterally, no wheezes, rhonchi, or rales Pulses: 2+ radial pulses, 2+ pedal pulses, normal cap refill Feet range of motion normal today, no major swelling of either foot today.  Diabetic Foot Exam - Simple   Simple Foot Form Diabetic Foot exam was performed with the following findings: Yes 12/30/2023  2:46 PM  Visual Inspection See comments: Yes Sensation Testing Intact to touch and monofilament testing bilaterally: Yes Pulse Check See comments: Yes Comments 1+ pedal pulses, mild bunion bilat, relatively flat arches      Assessment: Encounter Diagnoses  Name Primary?   Type 2 diabetes mellitus with chronic kidney disease, without long-term current use of insulin , unspecified CKD stage (HCC) Yes   Essential hypertension    Intention tremor    Hyperlipidemia associated with type 2 diabetes mellitus (HCC)    Foot swelling    Polypharmacy    Memory loss       Plan: Foot swelling-mostly resolved.  Doing fine on colchicine  and short-term ibuprofen .  She can discontinue those over the next few days  Hypertension-not at goal.  Pending labs tomorrow I will make an adjustment.  We will likely bump up valsartan  HCT from 160/12.5 mg to 320/12.5 mg daily  Tremor-no major tremor noted today.  She seems to be doing better off primidone   Polypharmacy-they want to limit the overall number of medications that she takes a lot  Hyperlipidemia-continue atorvastatin  40 mg daily  Diabetes-currently on glipizide  XL 5 mg daily and Januvia  50 mg daily.  Updated labs today.  Memory loss-follow-up with neurology since  stopping Aricept  and Namenda   Deborah Simon was seen today for follow-up.  Diagnoses and all orders for this visit:  Type 2 diabetes mellitus with chronic kidney disease, without long-term current use of insulin , unspecified CKD stage (HCC) -     Hemoglobin A1c  Essential hypertension -     Basic metabolic panel with GFR  Intention tremor  Hyperlipidemia associated with type 2 diabetes mellitus (HCC)  Foot swelling -     Uric acid  Polypharmacy  Memory loss    Follow up: pending labs

## 2023-12-31 ENCOUNTER — Ambulatory Visit: Payer: Self-pay | Admitting: Medical

## 2023-12-31 ENCOUNTER — Other Ambulatory Visit: Payer: Self-pay | Admitting: Medical

## 2023-12-31 DIAGNOSIS — E1169 Type 2 diabetes mellitus with other specified complication: Secondary | ICD-10-CM

## 2023-12-31 LAB — BASIC METABOLIC PANEL WITH GFR
BUN/Creatinine Ratio: 14 (ref 12–28)
BUN: 15 mg/dL (ref 8–27)
CO2: 21 mmol/L (ref 20–29)
Calcium: 9.8 mg/dL (ref 8.7–10.3)
Chloride: 104 mmol/L (ref 96–106)
Creatinine, Ser: 1.05 mg/dL — ABNORMAL HIGH (ref 0.57–1.00)
Glucose: 89 mg/dL (ref 70–99)
Potassium: 3.6 mmol/L (ref 3.5–5.2)
Sodium: 143 mmol/L (ref 134–144)
eGFR: 56 mL/min/1.73 — ABNORMAL LOW (ref >59)

## 2023-12-31 LAB — HEMOGLOBIN A1C
Est. average glucose Bld gHb Est-mCnc: 197 mg/dL
Hgb A1c MFr Bld: 8.5 % — ABNORMAL HIGH (ref 4.8–5.6)

## 2023-12-31 LAB — URIC ACID: Uric Acid: 6.8 mg/dL (ref 3.1–7.9)

## 2023-12-31 MED ORDER — INSULIN GLARGINE 100 UNIT/ML SOLOSTAR PEN
10.0000 [IU] | PEN_INJECTOR | Freq: Every day | SUBCUTANEOUS | 2 refills | Status: AC
Start: 2023-12-31 — End: ?

## 2023-12-31 MED ORDER — CITALOPRAM HYDROBROMIDE 20 MG PO TABS: 20.0000 mg | ORAL_TABLET | Freq: Every day | ORAL | 1 refills | Status: AC

## 2023-12-31 MED ORDER — SITAGLIPTIN PHOSPHATE 50 MG PO TABS: 50.0000 mg | ORAL_TABLET | Freq: Every day | ORAL | 1 refills | Status: AC

## 2023-12-31 MED ORDER — OLMESARTAN-AMLODIPINE-HCTZ 40-5-12.5 MG PO TABS: 1.0000 | ORAL_TABLET | Freq: Every day | ORAL | 1 refills | Status: DC

## 2023-12-31 MED ORDER — ATORVASTATIN CALCIUM 40 MG PO TABS: 40.0000 mg | ORAL_TABLET | Freq: Every day | ORAL | 1 refills | Status: AC

## 2023-12-31 MED ORDER — BD PEN NEEDLE NANO U/F 32G X 4 MM MISC: 1.0000 | Freq: Every day | 1 refills | Status: AC

## 2023-12-31 NOTE — Progress Notes (Signed)
 Electrolytes and kidney marker okay.  Diabetes marker not at goal at 8.5%.  Still pending uric acid test.  Lets discontinue the valsartan  HCT blood pressure pill.  Lets change to a similar but little bit stronger regimen called Tribenzor.  This has 3 and 1 medication.  I think this will work better plus it keeps her at 1 blood pressure pill.  Continue Januvia  but lets stop glipizide .  To replace glipizide  I want to start her on Lantus  long-acting once daily insulin .  Lets start out 6 units daily.  Monitor blood sugars.  After 1 week if her blood sugars in the  morning or not staying under 140 then increase to 10 units daily.  Have the pharmacist show you how to use the pen insulin  device or you can come in and see our nurse to do a nurse visit to learn how to use the device  Lets plan to do another follow-up in 4 to 6 weeks.  Bring blood sugar readings and blood pressure readings at that time

## 2023-12-31 NOTE — Progress Notes (Signed)
 Uric acid was not bad.  Finish out the colchicine   See other prior message from yesterday

## 2024-01-18 ENCOUNTER — Other Ambulatory Visit: Payer: Self-pay | Admitting: Medical

## 2024-01-18 DIAGNOSIS — R059 Cough, unspecified: Secondary | ICD-10-CM

## 2024-01-21 ENCOUNTER — Ambulatory Visit: Payer: Medicare HMO | Admitting: Adult Health

## 2024-01-21 ENCOUNTER — Encounter: Payer: Self-pay | Admitting: Adult Health

## 2024-01-21 VITALS — BP 139/70 | HR 87 | Ht 63.0 in | Wt 186.0 lb

## 2024-01-21 DIAGNOSIS — R251 Tremor, unspecified: Secondary | ICD-10-CM | POA: Diagnosis not present

## 2024-01-21 DIAGNOSIS — F0390 Unspecified dementia without behavioral disturbance: Secondary | ICD-10-CM

## 2024-01-21 NOTE — Patient Instructions (Signed)
 Your Plan:  Continue to monitor memory Score is stable today Let me know if you want to restart medication If your symptoms worsen or you develop new symptoms please let us  know.       Thank you for coming to see us  at [facility]. I hope we have been able to provide you high quality care today.  You may receive a patient satisfaction survey over the next few weeks. We would appreciate your feedback and comments so that we may continue to improve ourselves and the health of our patients.

## 2024-01-21 NOTE — Progress Notes (Signed)
 PATIENT: Deborah Simon DOB: [birth year]  REASON FOR VISIT: follow up HISTORY FROM: patient  Chief Complaint  Patient presents with   New Patient (Initial Visit)    Pt in 16 with daughter in law Pt and daughter in law states short term memory declining daughter in law states stopped Aricept  ,namenda  and primidone  . Daughter in law states felt medication was not helping patient      HISTORY OF PRESENT ILLNESS: Today 01/21/24:  Deborah Simon is a 73 y.o. female with a history of memory disturbance and essential tremor. Returns today for follow-up.  She is here today with her daughter-in-law.  She feels that her memory has remained stable.  Her daughter-in-law feels that it has declined.  She lives with her daughter-in-law and son.  She requires assistance with all ADLs.  She can do some household chores such as making her own bed.  Daughter-in-law manages her medications and appointments.  She is no longer operating a motor vehicle.  Denies any change in behavior including hallucinations.  Daughter-in-law and states that they stopped all medication because they did not feel that it was actually helping.  Tremor has remained relatively stable.  Gets worse when she is anxious.     05/19/23: Deborah Simon is a 73 y.o. female with a history of memory disturbance and essential tremor. Returns today for follow-up.  She is here today with her son.  She feels that her memory has remained stable.  She continues to live with her son and daughter-in-law.  Her daughter-in-law continues to manage her appointments, medications and finances.  She remains on Aricept  5 mg at bedtime and Namenda  10 mg twice a day.  She does feel that the tremor is worse at times.  Right hand greater than left.  She remains on primidone  50 mg at bedtime   12/08/22: Deborah Simon is a 73 y.o. female with a history of memory disturbance and essential tremor. Returns today for follow-up.  She is here today with her sister and  daughter-in-law.  Daughter-in-law feels that her memory is worse.  She requires prompting with all ADLs.  She lives with her son and daughter-in-law.  Daughter-in-law manages her finances appointments and medications.  At the last visit we restarted Aricept .  She has been tolerating this well.  No headaches.  Her sister reports some loose stools but this was going on before she started Aricept .   05/26/22: Deborah Simon is a 73 y.o. female with a history of memory disturbance and essential tremor.  She returns today for follow-up.  She continues to live with her son and daughter-in-law.  She is able to complete all ADLs independently.  Her son helps her with her medications ,, appointments and finances.  She does not operate a motor vehicle.  Denies any trouble sleeping.  At the last visit we did order primidone  25 mg at bedtime for essential tremor.  Daughter in law has not seen a big improvement in the tremors. Daughter in law would like the patient to retry Aricept  as she is not convinced that mediation caused her headaches. Patient agrees.    11/18/21: Deborah Simon is a 73 year old female with a history of memory disturbance.  She returns today for follow-up. Feels that memory is the same. Reports that she is forgetful with short term memory. Lives with her son. Able to complete all ADLs independently. Son helps with her medications, appointments and finances. Not driving. Sleeping ok and reports good appetite. Reports that some  foods cause her indigestion. Uses OTC antiacid.   Reports tremor in the upper extremities.  Son notices that with anxiety.  The patient states that she has a tremor every time she eats and sometimes with handwriting   10/08/20: Deborah Simon is a 73 year old female with a history of memory disturbance.  She returns today to discuss her results from her neurocognitive evaluation.  I have reviewed these results with the patient.  Dr. Jackquline have recommended further evaluation with LP  and AD biomarkers.  The patient at this time does not want to proceed with any further work-up.  She remains on Namenda  10 mg twice a day.  She lives at home with her daughter in law and son.  She is able to complete all ADLs independently.  She continues to cook without difficulty.  She does not operate a motor vehicle.  Her daughter-in-law manages her medications and appointments.  She denies any trouble sleeping.  She returns today for an evaluation.  04/03/20: Deborah Simon is a 74 year old female with a history of intermittent tremor the returns today for follow-up.  Her main concern today is memory disturbance.  She states that she has some issues with word finding.  She lives at home with her son.  She is able to complete all ADLs independently.  She manages her own finances without difficulty.  She manages her own medications and appointments.  She does use a pillbox and references a calendar.  She is able to shop independently for her needs but the granddaughter reports that she does need a list.  No change in mood or behavior.  Patient returns today for an evaluation.  HISTORY Deborah Simon is a 73 year old right-handed woman with an underlying medical history of hypertension, hyperlipidemia, type 2 diabetes, and overweight state, who reports an intermittent right hand tremor for the past 4 weeks or so.  Symptoms came on fairly suddenly.  They are primarily related to using her right hand and arm.  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  She has quite a bit of stress at work and is contemplating retiring.  She does better at home.  She gets tearful at times because of stress and trembling and it helps to calm her down by talking to her.  Her son has been helpful in that regard and has been able to calm her down.  Rubbing her hands on her thighs helps calm her down.  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  She does not drink caffeine on a day-to-day basis, she does not drink alcohol and is a non-smoker.  She tries to hydrate well, drinks about 2 bottles of 16 ounce water per day at work and also water at home.  She lives with one of her 2 sons who are grown.  She has not had any treatment for anxiety or stress. I reviewed your office note from 03/11/2019, which you kindly included.  She had blood work in your office on 03/11/2019 and I reviewed the results: Lipid panel showed benign findings with a total cholesterol of 139, HDL 60, triglycerides 54 and LDL 66.  CMP showed glucose mildly elevated at 120, BUN 19, creatinine 1.14, otherwise normal  findings, CBC with differential showed WBC of 6.5, hemoglobin 11.4 which is mildly low and hematocrit 33.7 which is also mildly low, platelets mildly elevated at 439, otherwise benign findings, A1c was mildly elevated at 6.6. She denies a family history of tremor or Parkinson's disease.  She has not fallen.  She has an eye examination once a year with Dr. Roz and is due for an appointment.  REVIEW OF SYSTEMS: Out of a complete 14 system review of symptoms, the patient complains only of the following symptoms, and all other reviewed systems are negative.  See HPI  ALLERGIES: Allergies  Allergen Reactions   Ace Inhibitors Swelling    Angioedema    Aricept  [Donepezil ]     headaches   Azithromycin  Swelling   Lisinopril  Swelling   Penicillins Other (See Comments)    Note  Sure rash of some sort probably, nothing airway related    HOME MEDICATIONS: Outpatient Medications Prior to Visit  Medication Sig Dispense Refill   albuterol  (PROVENTIL ) (2.5 MG/3ML) 0.083% nebulizer solution Take 3 mLs (2.5 mg total) by nebulization every 6 (six) hours as needed for wheezing or shortness of breath. 150 mL 1    albuterol  (VENTOLIN  HFA) 108 (90 Base) MCG/ACT inhaler INHALE 1-2 PUFFS BY MOUTH EVERY 6 HOURS AS NEEDED FOR WHEEZE OR SHORTNESS OF BREATH 8.5 each 0   atorvastatin  (LIPITOR) 40 MG tablet Take 1 tablet (40 mg total) by mouth daily. 90 tablet 1   Blood Glucose Monitoring Suppl (ACCU-CHEK GUIDE ME) w/Device KIT TESTS BLOOD SUGARS ONCE A DAY. E11.9 1 kit 0   Budeson-Glycopyrrol-Formoterol (BREZTRI  AEROSPHERE) 160-9-4.8 MCG/ACT AERO Inhale 2 puffs into the lungs in the morning and at bedtime. 10.7 each 5   Cetirizine HCl (ZYRTEC PO) Take by mouth.     citalopram  (CELEXA ) 20 MG tablet Take 1 tablet (20 mg total) by mouth daily. 90 tablet 1   colchicine  0.6 MG tablet Take 1 tablet (0.6 mg total) by mouth 2 (two) times daily. 20 tablet 0   Famotidine (PEPCID PO) Take by mouth.     ibuprofen  (ADVIL ) 600 MG tablet Take 1 tablet (600 mg total) by mouth 2 (two) times daily. 30 tablet 0   insulin  glargine (LANTUS ) 100 UNIT/ML Solostar Pen Inject 10 Units into the skin daily. 15 mL 2   Insulin  Pen Needle (BD PEN NEEDLE NANO U/F) 32G X 4 MM MISC 1 each by Does not apply route at bedtime. 100 each 1   Multiple Vitamins-Minerals (MULTIVITAMIN WOMEN 50+ PO) Take 1 tablet by mouth daily.     Olmesartan -amLODIPine -HCTZ 40-5-12.5 MG TABS Take 1 tablet by mouth daily. 90 tablet 1   pantoprazole  (PROTONIX ) 40 MG tablet TAKE 1 TABLET DAILY 45 MIN PRIOR TO BREAKFAST 90 tablet 1   sitaGLIPtin  (JANUVIA ) 50 MG tablet Take 1 tablet (50 mg total) by mouth daily. 90 tablet 1   donepezil  (ARICEPT ) 5 MG tablet TAKE 1 TABLET BY MOUTH EVERYDAY AT BEDTIME (Patient not taking: Reported on 01/21/2024) 90 tablet 1   memantine  (NAMENDA ) 10 MG tablet TAKE 1 TABLET BY MOUTH TWICE A DAY (Patient not taking: Reported on 01/21/2024) 180 tablet 1   No facility-administered medications prior to visit.    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   Diabetes  mellitus    Fatty liver 07/12/2019   Hyperlipidemia    Hypertension     PAST SURGICAL HISTORY: Past Surgical History:  Procedure Laterality  Date   CHOLECYSTECTOMY  10/07/11   Single site   COLONOSCOPY  03/27/2008   Dr.Patterson-normal    FAMILY HISTORY: Family History  Problem Relation Age of Onset   Stroke Mother    Stroke Father    Aneurysm Son    Colon cancer Neg Hx    Colon polyps Neg Hx    Esophageal cancer Neg Hx    Stomach cancer Neg Hx    Rectal cancer Neg Hx    Alzheimer's disease Neg Hx    Dementia Neg Hx     SOCIAL HISTORY: Social History   Socioeconomic History   Marital status: Single    Spouse name: Not on file   Number of children: 3   Years of education: Not on file   Highest education level: 12th grade  Occupational History   Not on file  Tobacco Use   Smoking status: Never   Smokeless tobacco: Never  Vaping Use   Vaping status: Never Used  Substance and Sexual Activity   Alcohol use: No   Drug use: No   Sexual activity: Not Currently  Other Topics Concern   Not on file  Social History Narrative   Lives with son & daughter in law   Right handed   Caffeine: hot tea    Social Drivers of Health   Financial Resource Strain: Low Risk  (06/30/2023)   Overall Financial Resource Strain (CARDIA)    Difficulty of Paying Living Expenses: Not hard at all  Food Insecurity: No Food Insecurity (06/30/2023)   Hunger Vital Sign    Worried About Running Out of Food in the Last Year: Never true    Ran Out of Food in the Last Year: Never true  Transportation Needs: No Transportation Needs (06/30/2023)   PRAPARE - Administrator, Civil Service (Medical): No    Lack of Transportation (Non-Medical): No  Physical Activity: Inactive (06/30/2023)   Exercise Vital Sign    Days of Exercise per Week: 0 days    Minutes of Exercise per Session: 10 min  Stress: Stress Concern Present (06/30/2023)   [employer] of Occupational Health - Occupational  Stress Questionnaire    Feeling of Stress : Rather much  Social Connections: Socially Isolated (06/30/2023)   Social Connection and Isolation Panel    Frequency of Communication with Friends and Family: Once a week    Frequency of Social Gatherings with Friends and Family: Never    Attends Religious Services: 1 to 4 times per year    Active Member of [employer] or Organizations: No    Attends Banker Meetings: Never    Marital Status: Divorced  Catering manager Violence: Not At Risk (12/25/2022)   Humiliation, Afraid, Rape, and Kick questionnaire    Fear of Current or Ex-Partner: No    Emotionally Abused: No    Physically Abused: No    Sexually Abused: No      PHYSICAL EXAM  Vitals:   01/21/24 1321  BP: 139/70  Pulse: 87  Weight: 186 lb (84.4 kg)  Height: 5' 3 (1.6 m)      Body mass index is 32.95 kg/m.      01/21/2024    1:24 PM 05/19/2023   12:49 PM 12/08/2022    1:42 PM  MMSE - Mini Mental State Exam  Orientation to time 0 0 1  Orientation to Place 1 1 2   Registration 3 3 3   Attention/ Calculation 0 0 0  Recall 0 0 0  Language- name 2 objects 2 2 2   Language- repeat 0 1 0  Language- follow 3 step command 3 3 3   Language- read & follow direction 1 1 1   Write a sentence 1 0 1  Copy design 0 0 0  Total score 11 11 13       Generalized: Well developed, in no acute distress   Neurological examination  Mentation: Alert oriented to time, place, history taking. Follows all commands speech and language fluent Cranial nerve II-XII: Pupils were equal round reactive to light. Extraocular movements were full, visual field were full on confrontational test.  Head turning and shoulder shrug  were normal and symmetric. Motor: The motor testing reveals 5 over 5 strength of all 4 extremities. Good symmetric motor tone is noted throughout.  Sensory: Sensory testing is intact to soft touch on all 4 extremities. No evidence of extinction is noted.  Coordination:  Cerebellar testing reveals good finger-nose-finger and heel-to-shin bilaterally. Mild tremor in the upper extremities with finger-nose-finger left > right. Gait and station: Gait is normal.    DIAGNOSTIC DATA (LABS, IMAGING, TESTING) - I reviewed patient records, labs, notes, testing and imaging myself where available.  Lab Results  Component Value Date   WBC 10.4 07/06/2023   HGB 12.4 07/06/2023   HCT 37.5 07/06/2023   MCV 87.4 07/06/2023   PLT 390 07/06/2023      Component Value Date/Time   NA 143 12/30/2023 1458   K 3.6 12/30/2023 1458   CL 104 12/30/2023 1458   CO2 21 12/30/2023 1458   GLUCOSE 89 12/30/2023 1458   GLUCOSE 84 07/06/2023 1502   BUN 15 12/30/2023 1458   CREATININE 1.05 (H) 12/30/2023 1458   CREATININE 1.00 (H) 11/16/2015 1008   CALCIUM  9.8 12/30/2023 1458   PROT 8.3 (H) 07/06/2023 1502   PROT 7.8 06/30/2023 1605   ALBUMIN 4.4 07/06/2023 1502   ALBUMIN 4.6 06/30/2023 1605   AST 33 07/06/2023 1502   ALT 40 07/06/2023 1502   ALKPHOS 111 07/06/2023 1502   BILITOT 0.4 07/06/2023 1502   BILITOT 0.2 06/30/2023 1605   GFRNONAA >60 07/06/2023 1502   GFRNONAA 60 11/16/2015 1008   GFRAA 60 01/25/2020 1456   GFRAA 69 11/16/2015 1008   Lab Results  Component Value Date   CHOL 217 (H) 06/30/2023   HDL 63 06/30/2023   LDLCALC 139 (H) 06/30/2023   LDLDIRECT 131 (H) 09/01/2011   TRIG 84 06/30/2023   CHOLHDL 3.4 06/30/2023   Lab Results  Component Value Date   HGBA1C 8.5 (H) 12/30/2023   Lab Results  Component Value Date   VITAMINB12 680 09/28/2019   Lab Results  Component Value Date   TSH 0.991 03/25/2022      ASSESSMENT AND PLAN 73 y.o. year old female  has a past medical history of Degenerative joint disease (DJD) of lumbar spine (11/01/2019), Diabetes mellitus, Fatty liver (07/12/2019), Hyperlipidemia, and Hypertension. here with:  1.  Memory disturbance  -MMSE 11/30 previously 11/30- stable - Does not wish to be on any medication at this time.   Did advise if they want to restart Aricept  and Namenda  to let us  know  2.  Tremor  - stable - Does not wish to start any new medication at this time   Patient would like to follow-up in our office in 1 year.    Advised if symptoms worsen or she develops new symptoms she should let us  know.     Duwaine Russell, MSN, NP-C 01/21/2024, 1:39  PM [facility] [address] [place], [place] [address] [phone]

## 2024-01-28 DIAGNOSIS — K760 Fatty (change of) liver, not elsewhere classified: Secondary | ICD-10-CM | POA: Diagnosis not present

## 2024-01-28 DIAGNOSIS — J4530 Mild persistent asthma, uncomplicated: Secondary | ICD-10-CM | POA: Diagnosis not present

## 2024-01-28 DIAGNOSIS — F02B0 Dementia in other diseases classified elsewhere, moderate, without behavioral disturbance, psychotic disturbance, mood disturbance, and anxiety: Secondary | ICD-10-CM | POA: Diagnosis not present

## 2024-01-28 DIAGNOSIS — N1831 Chronic kidney disease, stage 3a: Secondary | ICD-10-CM | POA: Diagnosis not present

## 2024-01-28 DIAGNOSIS — Z1339 Encounter for screening examination for other mental health and behavioral disorders: Secondary | ICD-10-CM | POA: Diagnosis not present

## 2024-01-28 DIAGNOSIS — K219 Gastro-esophageal reflux disease without esophagitis: Secondary | ICD-10-CM | POA: Diagnosis not present

## 2024-01-28 DIAGNOSIS — E1159 Type 2 diabetes mellitus with other circulatory complications: Secondary | ICD-10-CM | POA: Diagnosis not present

## 2024-01-28 DIAGNOSIS — Z7689 Persons encountering health services in other specified circumstances: Secondary | ICD-10-CM | POA: Diagnosis not present

## 2024-01-28 DIAGNOSIS — Z794 Long term (current) use of insulin: Secondary | ICD-10-CM | POA: Diagnosis not present

## 2024-01-28 DIAGNOSIS — E559 Vitamin D deficiency, unspecified: Secondary | ICD-10-CM | POA: Diagnosis not present

## 2024-01-28 DIAGNOSIS — E1122 Type 2 diabetes mellitus with diabetic chronic kidney disease: Secondary | ICD-10-CM | POA: Diagnosis not present

## 2024-01-28 DIAGNOSIS — G300 Alzheimer's disease with early onset: Secondary | ICD-10-CM | POA: Diagnosis not present

## 2024-01-28 DIAGNOSIS — R54 Age-related physical debility: Secondary | ICD-10-CM | POA: Diagnosis not present

## 2024-01-28 DIAGNOSIS — Z7982 Long term (current) use of aspirin: Secondary | ICD-10-CM | POA: Diagnosis not present

## 2024-01-28 DIAGNOSIS — I1310 Hypertensive heart and chronic kidney disease without heart failure, with stage 1 through stage 4 chronic kidney disease, or unspecified chronic kidney disease: Secondary | ICD-10-CM | POA: Diagnosis not present

## 2024-01-28 DIAGNOSIS — E1169 Type 2 diabetes mellitus with other specified complication: Secondary | ICD-10-CM | POA: Diagnosis not present

## 2024-01-28 DIAGNOSIS — Z789 Other specified health status: Secondary | ICD-10-CM | POA: Diagnosis not present

## 2024-01-28 DIAGNOSIS — Z7984 Long term (current) use of oral hypoglycemic drugs: Secondary | ICD-10-CM | POA: Diagnosis not present

## 2024-01-28 DIAGNOSIS — K644 Residual hemorrhoidal skin tags: Secondary | ICD-10-CM | POA: Diagnosis not present

## 2024-01-28 DIAGNOSIS — H25813 Combined forms of age-related cataract, bilateral: Secondary | ICD-10-CM | POA: Diagnosis not present

## 2024-01-28 DIAGNOSIS — E782 Mixed hyperlipidemia: Secondary | ICD-10-CM | POA: Diagnosis not present

## 2024-01-28 DIAGNOSIS — Z1331 Encounter for screening for depression: Secondary | ICD-10-CM | POA: Diagnosis not present

## 2024-01-28 DIAGNOSIS — Z7985 Long-term (current) use of injectable non-insulin antidiabetic drugs: Secondary | ICD-10-CM | POA: Diagnosis not present

## 2024-03-14 DIAGNOSIS — Z1389 Encounter for screening for other disorder: Secondary | ICD-10-CM | POA: Diagnosis not present

## 2024-03-14 DIAGNOSIS — Z1383 Encounter for screening for respiratory disorder NEC: Secondary | ICD-10-CM | POA: Diagnosis not present

## 2024-03-14 DIAGNOSIS — Z1331 Encounter for screening for depression: Secondary | ICD-10-CM | POA: Diagnosis not present

## 2024-03-14 DIAGNOSIS — Z1231 Encounter for screening mammogram for malignant neoplasm of breast: Secondary | ICD-10-CM | POA: Diagnosis not present

## 2024-03-14 DIAGNOSIS — Z13820 Encounter for screening for osteoporosis: Secondary | ICD-10-CM | POA: Diagnosis not present

## 2024-03-14 DIAGNOSIS — Z23 Encounter for immunization: Secondary | ICD-10-CM | POA: Diagnosis not present

## 2024-03-14 DIAGNOSIS — Z1339 Encounter for screening examination for other mental health and behavioral disorders: Secondary | ICD-10-CM | POA: Diagnosis not present

## 2024-03-14 DIAGNOSIS — Z0000 Encounter for general adult medical examination without abnormal findings: Secondary | ICD-10-CM | POA: Diagnosis not present

## 2024-03-15 ENCOUNTER — Ambulatory Visit

## 2024-03-17 ENCOUNTER — Other Ambulatory Visit: Payer: Self-pay | Admitting: Medical

## 2024-03-17 ENCOUNTER — Telehealth: Payer: Self-pay | Admitting: Adult Health

## 2024-03-17 NOTE — Telephone Encounter (Signed)
  This was discontinued by Ludie on 12/31/23

## 2024-03-21 NOTE — Telephone Encounter (Signed)
 The original prescription was discontinued on 12/30/2023 by Bulah Alm RAMAN, PA-C. Renewing this prescription may not be appropriate.

## 2024-03-22 NOTE — Telephone Encounter (Signed)
 I called daughter of pt.  She said she did not know why they sent that.  Pt is not taking.  I told her that was the reason for our call to follow up.  I told her will refuse and cancel the prescription.  She appreciated that and was also going to call them as well.

## 2024-04-18 ENCOUNTER — Telehealth: Payer: Self-pay | Admitting: Internal Medicine

## 2024-04-18 NOTE — Telephone Encounter (Signed)
 Pt is now seeing a new provider Sula Sigrid [facility] @ [facility] [phone] [address] [place], [place] [address]   Pt saw him on 03/14/24

## 2024-04-27 ENCOUNTER — Other Ambulatory Visit: Payer: Self-pay | Admitting: Medical

## 2024-04-27 NOTE — Telephone Encounter (Signed)
 Pt is seeing new provider else where

## 2024-06-18 ENCOUNTER — Other Ambulatory Visit: Payer: Self-pay | Admitting: Medical

## 2025-01-23 ENCOUNTER — Ambulatory Visit: Admitting: Adult Health
